# Patient Record
Sex: Male | Born: 1960
Health system: Southern US, Community
[De-identification: ages and names within clinical notes are randomized; demographics above are authoritative.]

## PROBLEM LIST (undated history)

## (undated) DIAGNOSIS — K760 Fatty (change of) liver, not elsewhere classified: Secondary | ICD-10-CM

## (undated) DIAGNOSIS — S9306XA Dislocation of unspecified ankle joint, initial encounter: Secondary | ICD-10-CM

## (undated) DIAGNOSIS — G459 Transient cerebral ischemic attack, unspecified: Secondary | ICD-10-CM

## (undated) DIAGNOSIS — M5030 Other cervical disc degeneration, unspecified cervical region: Secondary | ICD-10-CM

## (undated) DIAGNOSIS — R918 Other nonspecific abnormal finding of lung field: Secondary | ICD-10-CM

## (undated) DIAGNOSIS — G8929 Other chronic pain: Secondary | ICD-10-CM

## (undated) DIAGNOSIS — R0600 Dyspnea, unspecified: Secondary | ICD-10-CM

## (undated) DIAGNOSIS — Z87442 Personal history of urinary calculi: Secondary | ICD-10-CM

## (undated) DIAGNOSIS — F430 Acute stress reaction: Secondary | ICD-10-CM

## (undated) DIAGNOSIS — S82831A Other fracture of upper and lower end of right fibula, initial encounter for closed fracture: Secondary | ICD-10-CM

## (undated) DIAGNOSIS — I10 Essential (primary) hypertension: Secondary | ICD-10-CM

## (undated) DIAGNOSIS — R7303 Prediabetes: Secondary | ICD-10-CM

## (undated) DIAGNOSIS — M542 Cervicalgia: Secondary | ICD-10-CM

## (undated) DIAGNOSIS — I2510 Atherosclerotic heart disease of native coronary artery without angina pectoris: Secondary | ICD-10-CM

## (undated) DIAGNOSIS — F419 Anxiety disorder, unspecified: Secondary | ICD-10-CM

## (undated) DIAGNOSIS — E7800 Pure hypercholesterolemia, unspecified: Secondary | ICD-10-CM

## (undated) DIAGNOSIS — L570 Actinic keratosis: Secondary | ICD-10-CM

## (undated) DIAGNOSIS — I639 Cerebral infarction, unspecified: Secondary | ICD-10-CM

## (undated) DIAGNOSIS — G709 Myoneural disorder, unspecified: Secondary | ICD-10-CM

## (undated) DIAGNOSIS — I700 Atherosclerosis of aorta: Secondary | ICD-10-CM

## (undated) DIAGNOSIS — S92051A Displaced other extraarticular fracture of right calcaneus, initial encounter for closed fracture: Secondary | ICD-10-CM

## (undated) DIAGNOSIS — R0609 Other forms of dyspnea: Secondary | ICD-10-CM

## (undated) DIAGNOSIS — E119 Type 2 diabetes mellitus without complications: Secondary | ICD-10-CM

## (undated) DIAGNOSIS — K8020 Calculus of gallbladder without cholecystitis without obstruction: Secondary | ICD-10-CM

## (undated) DIAGNOSIS — R42 Dizziness and giddiness: Secondary | ICD-10-CM

## (undated) HISTORY — DX: Fatty (change of) liver, not elsewhere classified: K76.0

## (undated) HISTORY — DX: Dizziness and giddiness: R42

## (undated) HISTORY — DX: Acute stress reaction: F43.0

## (undated) HISTORY — DX: Prediabetes: R73.03

## (undated) HISTORY — DX: Other forms of dyspnea: R06.09

## (undated) HISTORY — DX: Other cervical disc degeneration, unspecified cervical region: M50.30

## (undated) HISTORY — DX: Other nonspecific abnormal finding of lung field: R91.8

## (undated) HISTORY — DX: Atherosclerotic heart disease of native coronary artery without angina pectoris: I25.10

## (undated) HISTORY — DX: Essential (primary) hypertension: I10

## (undated) HISTORY — DX: Pure hypercholesterolemia, unspecified: E78.00

## (undated) HISTORY — DX: Cervicalgia: M54.2

## (undated) HISTORY — DX: Anxiety disorder, unspecified: F41.9

## (undated) HISTORY — DX: Transient cerebral ischemic attack, unspecified: G45.9

## (undated) HISTORY — DX: Dislocation of unspecified ankle joint, initial encounter: S93.06XA

## (undated) HISTORY — DX: Other fracture of upper and lower end of right fibula, initial encounter for closed fracture: S82.831A

## (undated) HISTORY — DX: Atherosclerosis of aorta: I70.0

## (undated) HISTORY — DX: Actinic keratosis: L57.0

## (undated) HISTORY — DX: Dyspnea, unspecified: R06.00

---
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
## 2003-01-07 HISTORY — PX: BACK SURGERY: SHX140

## 2008-09-07 ENCOUNTER — Observation Stay (HOSPITAL_COMMUNITY): Admission: EM | Admit: 2008-09-07 | Discharge: 2008-09-08 | Payer: Self-pay | Admitting: Emergency Medicine

## 2010-04-12 LAB — COMPREHENSIVE METABOLIC PANEL
ALT: 10 U/L (ref 0–53)
Albumin: 4.1 g/dL (ref 3.5–5.2)
Alkaline Phosphatase: 67 U/L (ref 39–117)
Chloride: 104 mEq/L (ref 96–112)
Glucose, Bld: 106 mg/dL — ABNORMAL HIGH (ref 70–99)
Potassium: 3.8 mEq/L (ref 3.5–5.1)
Sodium: 137 mEq/L (ref 135–145)
Total Bilirubin: 1.4 mg/dL — ABNORMAL HIGH (ref 0.3–1.2)
Total Protein: 6.7 g/dL (ref 6.0–8.3)

## 2010-04-12 LAB — CARDIAC PANEL(CRET KIN+CKTOT+MB+TROPI)
CK, MB: 1.2 ng/mL (ref 0.3–4.0)
CK, MB: 1.4 ng/mL (ref 0.3–4.0)
Total CK: 83 U/L (ref 7–232)
Troponin I: 0.02 ng/mL (ref 0.00–0.06)

## 2010-04-12 LAB — DIFFERENTIAL
Basophils Absolute: 0 10*3/uL (ref 0.0–0.1)
Basophils Relative: 1 % (ref 0–1)
Eosinophils Absolute: 0.1 10*3/uL (ref 0.0–0.7)
Monocytes Absolute: 0.3 10*3/uL (ref 0.1–1.0)
Monocytes Relative: 6 % (ref 3–12)
Neutrophils Relative %: 53 % (ref 43–77)

## 2010-04-12 LAB — CBC
HCT: 40.7 % (ref 39.0–52.0)
Hemoglobin: 13.9 g/dL (ref 13.0–17.0)
MCHC: 34.1 g/dL (ref 30.0–36.0)
RBC: 4.48 MIL/uL (ref 4.22–5.81)
RDW: 13.8 % (ref 11.5–15.5)

## 2010-04-12 LAB — POCT I-STAT, CHEM 8
BUN: 25 mg/dL — ABNORMAL HIGH (ref 6–23)
Calcium, Ion: 1.13 mmol/L (ref 1.12–1.32)
Chloride: 105 mEq/L (ref 96–112)
Creatinine, Ser: 1 mg/dL (ref 0.4–1.5)
Glucose, Bld: 102 mg/dL — ABNORMAL HIGH (ref 70–99)
HCT: 42 % (ref 39.0–52.0)
Potassium: 3.7 mEq/L (ref 3.5–5.1)

## 2010-04-12 LAB — CK TOTAL AND CKMB (NOT AT ARMC): CK, MB: 2.3 ng/mL (ref 0.3–4.0)

## 2010-04-12 LAB — POCT CARDIAC MARKERS: CKMB, poc: <1 ng/mL — ABNORMAL LOW (ref 1.0–8.0)

## 2010-05-21 NOTE — Consult Note (Signed)
NAMESUSANA, Tom Phelps                    ACCOUNT NO.:  [id number]   MEDICAL RECORD NO.:  [id number]          PATIENT TYPE:  OBV   LOCATION:  3707                         FACILITY:  [facility]   PHYSICIAN:  Jake Bathe, MD      DATE OF BIRTH:  February 03, 1960   DATE OF CONSULTATION:  09/07/2008  DATE OF DISCHARGE:                                 CONSULTATION   PRIMARY CARE PHYSICIAN:  Emeterio Reeve, MD   REFERRING PHYSICIAN:  [facility].   REASON FOR CONSULTATION:  Evaluation of chest pain.   HISTORY OF PRESENT ILLNESS:  A 50 year old male with no prior  cardiovascular history with a father who had a myocardial infarction  died at age 83 with no diabetes, no hypertension, no hyperlipidemia who  had new onset chest pressure and heaviness that occurred 2 days ago with  work.  He works for [employer].  During heavy lifting and activity,  he felt a chest heaviness and some increased dyspnea at the time.  It  took him approximately 2 hours to have some relief over this discomfort.  He also had some associated dizziness and excessive fatigue.  He had a  concomitant headache.  This happened again yesterday morning during work  and worried him.  He did go see his primary care physician, Dr. Paulino Rily  who performed an EKG on him and was concerned and sent him to [facility]  Emergency Department for further evaluation.  Since he has been here, he  is chest pain free and quite comfortable in the bed, but is worried  about his heart given his prior family history as noted above.  Up until  2 days ago, he has had no symptoms.  He described no nausea or  diaphoresis associated with these symptoms.  He also felt that his legs  were heavy at that time and had overall malaise and fatigue.  He thinks  that he may have been slightly dehydrated also.   PAST MEDICAL HISTORY:  Possible hyperlipidemia, impaired fasting blood  sugar and strong family history of CAD.   FAMILY HISTORY:  Father died at age  45 from CAD.   SOCIAL HISTORY:  Denies any smoking, alcohol, or drug use.  He works for  [employer].   REVIEW OF SYSTEMS:  Denies any cough, fever, syncope, bleeding,  orthopnea, PND, snoring.  Unless specified above all other 12 review of  systems negative.   ALLERGIES:  No known drug allergies.   MEDICATIONS:  Advil or Aleve.   PHYSICAL EXAMINATION:  VITAL SIGNS:  Blood pressure 116/73, pulse 55,  temperature 97.0, respirations 16, sating 99% on room air.  GENERAL:  Alert and oriented x3 in no acute distress, comfortable in  bed.  EYES:  Well-perfused conjunctivae.  EOMI.  No scleral icterus.  NECK:  Supple.  No lymphadenopathy, no JVD, no carotid bruits, supple.  CARDIOVASCULAR:  Regular rate and rhythm without any murmurs, rubs, or  gallops.  LUNGS:  Clear to auscultation bilaterally.  Normal respiratory effort.  ABDOMEN:  Soft, nontender, normoactive bowel sounds.  No  hepatosplenomegaly.  No bruits.  EXTREMITIES:  No clubbing, cyanosis, or edema.  Distal pulses 2+  bilateral and equal.  SKIN:  Warm, dry, and intact.  No rashes.  NEUROLOGIC:  Nonfocal.  Moves all extremities x4.  Ambulating well.  PSYCHOLOGIC:  Normal affect.   DATA:  Cardiac biomarkers thus far are normal with troponin of 0.02,  lipase is normal at 16.  Sodium 137, potassium 3.8, BUN 23, creatinine  0.9.  Liver functions unremarkable except for slightly elevated total  bilirubin of 1.4.  White count is 5.3, hemoglobin 13.9, hematocrit 40.7,  platelets 223.  EKG personally viewed shows sinus rhythm without any  other acute abnormalities.  EKG was reviewed from Dr. Laurine Blazer' office  and there is subtle J-point elevation noted in the precordial leads,  otherwise unremarkable.  Chest x-ray, slight hyperinflation, but  otherwise normal, personally reviewed, prior medical records reviewed.   ASSESSMENT/PLAN:  A 50 year old male with strong family history of early  coronary artery disease with chest pain.   1. Chest pain - he has both typical and atypical features.  Currently,      he is quite comfortable in his ECGs as well as his cardiac      biomarkers are currently reassuring.  I agree with current      management of telemetry cardiac biomarker observation and making      him n.p.o. past midnight in case.  At this point, if his cardiac      biomarkers remain normal and his EKG remains unremarkable and he      remains chest pain free, I would prove for further risk      stratification with a noninvasive method such as nuclear stress      test.  Certainly, this may be able to be performed as an outpatient      if after observation he has deemed low risk.  Continue to monitor      him closely.  2. Family history of coronary artery disease - continue with aspirin.  3. Fatigue, malaise - certainly dehydration could had been playing a      role.  His BUN and creatinine are normal.  Electrolytes are      unremarkable.  Continue to monitor.  Reassuring symptoms currently.      I will follow along with you.  Thank you for this consult.      Jake Bathe, MD  Electronically Signed     MCS/MEDQ  D:  09/07/2008  T:  09/08/2008  Job:  [id number]   cc:   Emeterio Reeve, MD  [facility]

## 2010-05-21 NOTE — H&P (Signed)
Tom Phelps, Tom Phelps                    ACCOUNT NO.:  [id number]   MEDICAL RECORD NO.:  [id number]          PATIENT TYPE:  EMS   LOCATION:  MAJO                         FACILITY:  [facility]   PHYSICIAN:  Marinda Elk, M.D.DATE OF BIRTH:  12-15-1960   DATE OF ADMISSION:  09/07/2008  DATE OF DISCHARGE:                              HISTORY & PHYSICAL   PRIMARY CARE PHYSICIAN:  Emeterio Reeve, MD   CHIEF COMPLAINT:  Chest pain.   HISTORY OF PRESENT ILLNESS:  Tom Phelps is a 50 year old male patient with  personal history of elevated cholesterol not on medications, as well as  a strong family history of coronary artery disease in both parents.  He  reports Tuesday after work having difficulty walking secondary to  excessive fatigue and legs being heavy in his description.  He went home  as usual, showered, drank some fluids and after several hours the  symptoms slowly resolved.  Wednesday morning about 11 a.m., he noticed  that as he was working outside of his usual job with activity his  breathing just felt very heavy, especially while walking in a normal  pace.  He was not fast, not running and all day yesterday evening had a  mild headache and felt somewhat drowsy.  This morning, he awakened and  with activity began experiencing some left anterior chest pain pressure-  like that radiated into the left shoulder and into the back.  This has  persisted since he arose about 8 o'clock this morning and has been  waxing and waning in quality.  He now reports that this pain is mainly  in the left back.  This pain was associated with mild shortness of  breath.  No nausea and vomiting.  No reflux symptoms.  Because of the  chest pressure in his family history, the patient was very concerned and  he met his primary care doctor for evaluation.  A 12-lead EKG at the  office showed questionable ST elevation in the anterior leads.  Primary  care physician felt that because of the patient's symptoms,  personal  history and family history, he needed to come to the ER for evaluation  and more than likely 24 hours observation admission to rule out ischemia  as etiology of the patient's chest pain.  In addition, the patient  reports he had a chest pain episode in [place] about 10-12 years ago, had  a negative stress test then but states these symptoms are different and  seemed to be worse to him.   REVIEW OF SYSTEMS:  CONSTITUTIONAL:  No recent fevers, chills, myalgias,  weight loss or anorexia.  PSYCH:  No increase in personal stressors, no  anxiety or depression reported.  EYES, EARS, NOSE AND THROAT:  No  itching or redness of the eyes, no pain or swelling or drainage from the  ears, no facial swelling or sinus pressure and no sore throat.  NEURO:  He did have the headache yesterday which is not normal for him.  He was  not having any dizziness, no unilateral weakness disuse or  numbness or  tingling of extremities.  No visual disturbances.  He has not reported  any seizures or fall.  CHEST:  New mild dyspnea on exertion and some  mild resting shortness of breath while experiencing chest pain,  otherwise, no cough, no hemoptysis, no pleuritic chest pain.  CARDIOVASCULAR:  Chest pain is described over the past 24 hours possibly  chest pain equivalent over the past 2 days.  No tachy palpitations.  No  syncope, no peripheral edema.  No definite orthopnea.  ABDOMEN:  No  nausea and vomiting.  No diarrhea.  No noticeable abdominal pain.  No  food intolerances.  No change in bowel or bladder.  No dark or bloody  stools.  No hematemesis.  MUSCULOSKELETAL:  Noncontributory to this  exam.  GENITOURINARY:  The patient has been voiding as per usual state  and when he works outside he normally drinks plenty of fluids because of  working in the heat.   SOCIAL HISTORY:  No tobacco.  No alcohol.  Works [employer] se[employer].   FAMILY HISTORY:  Father is deceased age 50 from acute MI.  He also  had  history of hypertension and smoking.  Mother deceased at age 109 from a  postoperative MI.   PAST MEDICAL HISTORY:  1. Elevated cholesterol currently not on medication therapy.  2. Impaired fasting blood sugar.   PAST SURGICAL HISTORY:  L4-L5 back surgery in 2005.   ALLERGIES:  NKDA.   CURRENT MEDICATIONS:  Over-the-counter Advil and Aleve as needed.   PHYSICAL EXAMINATION:  GENERAL:  Pleasant male patient in no acute  distress currently complaining of a persistent dull left anterior chest  pain now mainly located in the left back.  VITAL SIGNS:  Temperature 97.5, BP 123/77, pulse 63 and regular,  respirations 20.  PSYCH:  The patient is alert and oriented x3.  Affect appropriate to  current situation.  NEURO:  Cranial nerves II-XII are grossly intact.  Moving all  extremities x4 without focal neurologic deficit.  Sclera noninjected,  nonicteric.  Conjunctivae are pink.  EAR, NOSE AND THROAT:  Ears are symmetrical.  No otorrhea.  Nose is  midline.  No rhinorrhea.  Oral mucous membranes are pink and moist.  CHEST:  Bilateral lung sounds are clear to auscultation anteriorly.  Respiratory effort is nonlabored.  He is satting 98% on 2 liters nasal  cannula O2 which has been applied in the emergency department.  CARDIOVASCULAR:  Heart sounds are S1-S2.  No rubs, murmurs, heaves or  gallops.  Pulses regular and non-tachycardic.  No appreciable JVD.  Carotid pulses are present 2+, no bruits.  Radial pulses are present and  distal pedal pulses are present as well.  No peripheral edema.  ABDOMEN:  Soft.  He has some very mild, high umbilical low epigastric  pain on palpation that does not radiate.  No guarding.  No rebounding.  Bowel sounds are present.  Otherwise, abdominal exam is negative.  EXTREMITIES:  Symmetrical in appearance without cyanosis or clubbing.   LABORATORY DATA:  Point-of-care markers have been ordered by the ER  physician and are pending at the time of  dictation.   DIAGNOSTICS:  EKG from Dr. Paulino Rily office shows sinus bradycardia,  ventricular rate 55, J-point elevation in lead I and V4-V6.  Repeat EKG  done in the ER after arrival shows normal sinus rhythm, ventricular rate  65, J-point elevation in lead I, otherwise, no acute changes.   IMPRESSION:  1. Typical chest pain.  2. Mild abdominal pain, uncertain etiology.  3. Elevated cholesterol currently not on medications.  4. History of impaired fasting blood sugar   PLAN:  1. Admit to the telemetry unit for 24-hour observation to hospitalist      team 3.  2. Cycle cardiac enzymes.  Continue O2 for chest pain and once the      pain is resolved, wean O2 off.  EKG if recurrent chest pain.  3. Daily aspirin.  Check fasting lipid panel, check hemoglobin A1c      based on personal history. Will also get cardiology consult as he      may need futher imigaing of his heart.  4. Lovenox for DVT prophylaxis.  5. Protonix for GI prophylaxis.  6. Check baseline labs with CMET and CBC.  Because the patient is      having abdominal pain, need to check an amylase and lipase to rule      out atypical presentation of pancreatitis, although no apparent      risk factors.  7. Given the patient's strong family history of sudden cardiac death,      especially, in male father age less than 60 important to evaluate      these current symptoms on the inpatient status.  If workup for      acute ischemic event is negative, the patient may need an      outpatient stress test.      Revonda Standard L. Rennis Harding, N.P.      Marinda Elk, M.D.  Electronically Signed    ALE/MEDQ  D:  09/07/2008  T:  09/08/2008  Job:  [id number]   cc:   Emeterio Reeve, MD

## 2015-05-22 ENCOUNTER — Ambulatory Visit: Payer: Managed Care, Other (non HMO) | Admitting: Neurology

## 2016-12-10 DIAGNOSIS — R42 Dizziness and giddiness: Secondary | ICD-10-CM | POA: Insufficient documentation

## 2017-01-06 DIAGNOSIS — E119 Type 2 diabetes mellitus without complications: Secondary | ICD-10-CM

## 2017-01-06 HISTORY — DX: Type 2 diabetes mellitus without complications: E11.9

## 2017-02-05 ENCOUNTER — Other Ambulatory Visit: Payer: Self-pay | Admitting: Family Medicine

## 2017-02-05 DIAGNOSIS — G459 Transient cerebral ischemic attack, unspecified: Secondary | ICD-10-CM

## 2017-02-05 DIAGNOSIS — I4891 Unspecified atrial fibrillation: Secondary | ICD-10-CM

## 2017-02-09 ENCOUNTER — Other Ambulatory Visit: Payer: Self-pay | Admitting: Family Medicine

## 2017-02-09 ENCOUNTER — Ambulatory Visit (INDEPENDENT_AMBULATORY_CARE_PROVIDER_SITE_OTHER): Payer: BLUE CROSS/BLUE SHIELD

## 2017-02-09 DIAGNOSIS — I499 Cardiac arrhythmia, unspecified: Secondary | ICD-10-CM

## 2017-02-09 DIAGNOSIS — G459 Transient cerebral ischemic attack, unspecified: Secondary | ICD-10-CM

## 2017-02-09 DIAGNOSIS — R42 Dizziness and giddiness: Secondary | ICD-10-CM | POA: Diagnosis not present

## 2017-02-09 DIAGNOSIS — I4891 Unspecified atrial fibrillation: Secondary | ICD-10-CM

## 2017-02-09 DIAGNOSIS — R079 Chest pain, unspecified: Secondary | ICD-10-CM

## 2017-04-09 ENCOUNTER — Emergency Department (HOSPITAL_COMMUNITY)
Admission: EM | Admit: 2017-04-09 | Discharge: 2017-04-10 | Disposition: A | Discharge status: Home / Self Care | Payer: BLUE CROSS/BLUE SHIELD | Attending: Emergency Medicine | Admitting: Emergency Medicine

## 2017-04-09 DIAGNOSIS — W19XXXA Unspecified fall, initial encounter: Secondary | ICD-10-CM

## 2017-04-09 DIAGNOSIS — W11XXXA Fall on and from ladder, initial encounter: Secondary | ICD-10-CM | POA: Insufficient documentation

## 2017-04-09 DIAGNOSIS — S80812A Abrasion, left lower leg, initial encounter: Secondary | ICD-10-CM | POA: Diagnosis not present

## 2017-04-09 DIAGNOSIS — Y939 Activity, unspecified: Secondary | ICD-10-CM | POA: Insufficient documentation

## 2017-04-09 DIAGNOSIS — Y998 Other external cause status: Secondary | ICD-10-CM | POA: Insufficient documentation

## 2017-04-09 DIAGNOSIS — S82831A Other fracture of upper and lower end of right fibula, initial encounter for closed fracture: Secondary | ICD-10-CM | POA: Diagnosis not present

## 2017-04-09 DIAGNOSIS — R109 Unspecified abdominal pain: Secondary | ICD-10-CM | POA: Insufficient documentation

## 2017-04-09 DIAGNOSIS — S92001A Unspecified fracture of right calcaneus, initial encounter for closed fracture: Secondary | ICD-10-CM | POA: Diagnosis not present

## 2017-04-09 DIAGNOSIS — M549 Dorsalgia, unspecified: Secondary | ICD-10-CM | POA: Insufficient documentation

## 2017-04-09 DIAGNOSIS — Y929 Unspecified place or not applicable: Secondary | ICD-10-CM | POA: Insufficient documentation

## 2017-04-09 DIAGNOSIS — Z7902 Long term (current) use of antithrombotics/antiplatelets: Secondary | ICD-10-CM | POA: Diagnosis not present

## 2017-04-09 DIAGNOSIS — S80811A Abrasion, right lower leg, initial encounter: Secondary | ICD-10-CM | POA: Diagnosis not present

## 2017-04-09 DIAGNOSIS — S99911A Unspecified injury of right ankle, initial encounter: Secondary | ICD-10-CM | POA: Diagnosis present

## 2017-04-09 DIAGNOSIS — Z23 Encounter for immunization: Secondary | ICD-10-CM | POA: Insufficient documentation

## 2017-04-09 DIAGNOSIS — S90415A Abrasion, left lesser toe(s), initial encounter: Secondary | ICD-10-CM | POA: Diagnosis not present

## 2017-04-09 MED ORDER — TETANUS-DIPHTH-ACELL PERTUSSIS 5-2.5-18.5 LF-MCG/0.5 IM SUSP
0.5000 mL | Freq: Once | INTRAMUSCULAR | Status: AC
  Administered 2017-04-10: 0.5 mL via INTRAMUSCULAR
  Filled 2017-04-09: qty 0.5

## 2017-04-09 NOTE — ED Triage Notes (Signed)
Patient fell/slid down extended 10 foot ladder, patient landed on his feet. Deformity/swelling noted to right, ankle. Alert and oriented x4, denies LOC

## 2017-04-10 ENCOUNTER — Other Ambulatory Visit: Payer: Self-pay

## 2017-04-10 ENCOUNTER — Emergency Department (HOSPITAL_COMMUNITY): Payer: BLUE CROSS/BLUE SHIELD

## 2017-04-10 LAB — CBC WITH DIFFERENTIAL/PLATELET
Basophils Absolute: 0 10*3/uL (ref 0.0–0.1)
Basophils Relative: 0 %
EOS ABS: 0.1 10*3/uL (ref 0.0–0.7)
EOS PCT: 1 %
HCT: 38.3 % — ABNORMAL LOW (ref 39.0–52.0)
Hemoglobin: 12.8 g/dL — ABNORMAL LOW (ref 13.0–17.0)
LYMPHS ABS: 3 10*3/uL (ref 0.7–4.0)
Lymphocytes Relative: 24 %
MCH: 29.8 pg (ref 26.0–34.0)
MCHC: 33.4 g/dL (ref 30.0–36.0)
MCV: 89.3 fL (ref 78.0–100.0)
MONO ABS: 0.8 10*3/uL (ref 0.1–1.0)
MONOS PCT: 7 %
Neutro Abs: 8.4 10*3/uL — ABNORMAL HIGH (ref 1.7–7.7)
Neutrophils Relative %: 68 %
Platelets: 235 10*3/uL (ref 150–400)
RBC: 4.29 MIL/uL (ref 4.22–5.81)
RDW: 13.6 % (ref 11.5–15.5)
WBC: 12.3 10*3/uL — ABNORMAL HIGH (ref 4.0–10.5)

## 2017-04-10 LAB — BASIC METABOLIC PANEL
Anion gap: 11 (ref 5–15)
BUN: 14 mg/dL (ref 6–20)
CO2: 21 mmol/L — ABNORMAL LOW (ref 22–32)
CREATININE: 1.26 mg/dL — AB (ref 0.61–1.24)
Calcium: 8.9 mg/dL (ref 8.9–10.3)
Chloride: 105 mmol/L (ref 101–111)
GFR calc Af Amer: >60 mL/min (ref >60)
GFR calc non Af Amer: >60 mL/min (ref >60)
Glucose, Bld: 162 mg/dL — ABNORMAL HIGH (ref 65–99)
Potassium: 2.9 mmol/L — ABNORMAL LOW (ref 3.5–5.1)
SODIUM: 137 mmol/L (ref 135–145)

## 2017-04-10 LAB — PROTIME-INR
INR: 1.06
Prothrombin Time: 13.7 seconds (ref 11.4–15.2)

## 2017-04-10 MED ORDER — HYDROMORPHONE HCL 1 MG/ML IJ SOLN: 1.0000 mg | Freq: Once | INTRAMUSCULAR | Status: DC

## 2017-04-10 MED ORDER — IOPAMIDOL (ISOVUE-300) INJECTION 61%
100.0000 mL | Freq: Once | INTRAVENOUS | Status: AC | PRN
  Administered 2017-04-10: 100 mL via INTRAVENOUS

## 2017-04-10 MED ORDER — HYDROMORPHONE HCL 1 MG/ML IJ SOLN
1.0000 mg | INTRAMUSCULAR | Status: AC
  Administered 2017-04-10: 1 mg via INTRAVENOUS
  Filled 2017-04-10: qty 1

## 2017-04-10 MED ORDER — OXYCODONE-ACETAMINOPHEN 5-325 MG PO TABS: 1.0000 | ORAL_TABLET | ORAL | 0 refills | Status: DC | PRN

## 2017-04-10 MED ORDER — ONDANSETRON 4 MG PO TBDP: 4.0000 mg | ORAL_TABLET | Freq: Three times a day (TID) | ORAL | 0 refills | Status: DC | PRN

## 2017-04-10 MED ORDER — SODIUM CHLORIDE 0.9 % IV SOLN
8.0000 mg | INTRAVENOUS | Status: AC
  Administered 2017-04-10: 8 mg via INTRAVENOUS
  Filled 2017-04-10: qty 4

## 2017-04-10 MED ORDER — IOPAMIDOL (ISOVUE-300) INJECTION 61%
INTRAVENOUS | Status: AC
  Filled 2017-04-10: qty 100

## 2017-04-10 MED ORDER — HYDROMORPHONE HCL 1 MG/ML IJ SOLN
1.0000 mg | Freq: Once | INTRAMUSCULAR | Status: AC
  Administered 2017-04-10: 1 mg via INTRAVENOUS
  Filled 2017-04-10: qty 1

## 2017-04-10 MED ORDER — PROCHLORPERAZINE EDISYLATE 5 MG/ML IJ SOLN
10.0000 mg | Freq: Once | INTRAMUSCULAR | Status: AC
  Administered 2017-04-10: 10 mg via INTRAVENOUS
  Filled 2017-04-10: qty 2

## 2017-04-10 MED ORDER — ONDANSETRON HCL 4 MG/2ML IJ SOLN
INTRAMUSCULAR | Status: AC
  Filled 2017-04-10: qty 4

## 2017-04-10 NOTE — ED Notes (Signed)
Patient vomiting.

## 2017-04-10 NOTE — ED Notes (Signed)
Received a call from xray requesting pain meds and nausea meds for patient so that they could complete films (patient was crying out and vomiting in xray)

## 2017-04-10 NOTE — ED Provider Notes (Signed)
[facility] EMERGENCY DEPARTMENT Provider Note   CSN: [id number] Arrival date & time: 04/09/17  2351     History   Chief Complaint Chief Complaint  Patient presents with  . Fall    R foot injury    HPI Tom Phelps is a 57 y.o. male.  Patient was painting at home at the top of a 10 foot ladder.  The ladder slid down the wall and patient landed on his bilateral feet and fell to his side.  He was not wearing shoes.  Denies hitting head or losing consciousness.  Complains of pain and swelling to his left toes and right ankle and foot.  Denies any head, neck, back, chest or abdominal pain.  Patient does take Plavix.  No other blood thinners. Able to wiggle toes.  Denies any numbness or tingling.  Unknown last tetanus shot.  The history is provided by the patient and the EMS personnel.  Fall  Pertinent negatives include no chest pain, no abdominal pain, no headaches and no shortness of breath.    No past medical history on file.  There are no active problems to display for this patient.   ** The histories are not reviewed yet. Please review them in the "History" navigator section and refresh this SmartLink.      Home Medications    Prior to Admission medications   Not on File    Family History No family history on file.  Social History Social History   Tobacco Use  . Smoking status: Not on file  Substance Use Topics  . Alcohol use: Not on file  . Drug use: Not on file     Allergies   Patient has no known allergies.   Review of Systems Review of Systems  Constitutional: Negative for activity change, appetite change and fever.  HENT: Negative for congestion and nosebleeds.   Respiratory: Negative for chest tightness and shortness of breath.   Cardiovascular: Negative for chest pain.  Gastrointestinal: Negative for abdominal pain, nausea and vomiting.  Genitourinary: Negative for dysuria.  Musculoskeletal: Positive for arthralgias and  myalgias. Negative for back pain and neck pain.  Neurological: Negative for dizziness, weakness, light-headedness, numbness and headaches.    all other systems are negative except as noted in the HPI and PMH.    Physical Exam Updated Vital Signs BP 127/71 (BP Location: Right Arm)   Pulse 73   Temp 98 F (36.7 C) (Oral)   Resp 19   Ht 5\' 10"  (1.778 m)   Wt 88.5 kg (195 lb)   SpO2 97%   BMI 27.98 kg/m   Physical Exam  Constitutional: He is oriented to person, place, and time. He appears well-developed and well-nourished. No distress.  Somewhat sedated after receiving 250 mcg of fentanyl by EMS  HENT:  Head: Normocephalic and atraumatic.  Mouth/Throat: Oropharynx is clear and moist. No oropharyngeal exudate.  Eyes: Pupils are equal, round, and reactive to light. Conjunctivae and EOM are normal.  Neck: Normal range of motion. Neck supple.  No meningismus.  Cardiovascular: Normal rate, regular rhythm, normal heart sounds and intact distal pulses.  No murmur heard. Pulmonary/Chest: Effort normal and breath sounds normal. No respiratory distress.  Abdominal: Soft. There is no tenderness. There is no rebound and no guarding.  Musculoskeletal: He exhibits edema, tenderness and deformity.  Right ankle and dorsal foot are diffusely swollen, tenderness across lateral malleolus.  Intact DP and PT pulses.  Achilles tendon is intact.  There are abrasions  to bilateral shins bilaterally and abrasions to left toes.  Intact DP and PT pulses in left foot as well.  Compartments are soft.  No proximal fibular tenderness.  Neurological: He is alert and oriented to person, place, and time. No cranial nerve deficit. He exhibits normal muscle tone. Coordination normal.  No ataxia on finger to nose bilaterally. No pronator drift. 5/5 strength throughout. CN 2-12 intact.Equal grip strength. Sensation intact.   Skin: Skin is warm.  Psychiatric: He has a normal mood and affect. His behavior is normal.    Nursing note and vitals reviewed.    ED Treatments / Results  Labs (all labs ordered are listed, but only abnormal results are displayed) Labs Reviewed  CBC WITH DIFFERENTIAL/PLATELET - Abnormal; Notable for the following components:      Result Value   WBC 12.3 (*)    Hemoglobin 12.8 (*)    HCT 38.3 (*)    Neutro Abs 8.4 (*)    All other components within normal limits  BASIC METABOLIC PANEL - Abnormal; Notable for the following components:   Potassium 2.9 (*)    CO2 21 (*)    Glucose, Bld 162 (*)    Creatinine, Ser 1.26 (*)    All other components within normal limits  PROTIME-INR    EKG None  Radiology Dg Tibia/fibula Left  Result Date: 04/10/2017 CLINICAL DATA:  Fall with deformity EXAM: LEFT TIBIA AND FIBULA - 2 VIEW COMPARISON:  None. FINDINGS: No acute displaced fracture or malalignment is seen. Moderate degenerative changes medial compartment of the knee. IMPRESSION: No definite acute osseous abnormality Electronically Signed   By: Jasmine Pang M.D.   On: 04/10/2017 01:47   Dg Tibia/fibula Right  Result Date: 04/10/2017 CLINICAL DATA:  Fall from 10 foot ladder EXAM: RIGHT TIBIA AND FIBULA - 2 VIEW COMPARISON:  None. FINDINGS: Mild degenerative changes involving the medial compartment of the knee. Proximal tibia and fibula are intact. Acute fracture involving the distal fibula with 15 mm superior laterally displaced cortical bone fragment. Lateral soft tissue swelling. Partially visible comminuted calcaneal fracture IMPRESSION: 1. Acute displaced fracture fragment adjacent to the lateral fibular malleolus 2. Partially visible calcaneal fracture Electronically Signed   By: Jasmine Pang M.D.   On: 04/10/2017 01:46   Dg Ankle Complete Left  Result Date: 04/10/2017 CLINICAL DATA:  Fall from 10 foot ladder EXAM: LEFT ANKLE COMPLETE - 3+ VIEW COMPARISON:  None. FINDINGS: No acute displaced fracture or malalignment. Ankle mortise is symmetric. Small calcaneal bone spur.  IMPRESSION: No acute osseous abnormality Electronically Signed   By: Jasmine Pang M.D.   On: 04/10/2017 01:48   Dg Ankle Complete Right  Result Date: 04/10/2017 CLINICAL DATA:  Fall from 10 foot ladder EXAM: RIGHT ANKLE - COMPLETE 3+ VIEW COMPARISON:  None. FINDINGS: Acute fracture involving the lateral cortex of the distal fibular malleolus with 16 mm displaced bone fragment superiorly and posteriorly. Acute comminuted and impacted calcaneal fracture. Ankle mortise grossly symmetric IMPRESSION: 1. Acute displaced fracture fragment off the lateral fibular malleolus. 2. Partially visible comminuted calcaneal fracture. Electronically Signed   By: Jasmine Pang M.D.   On: 04/10/2017 01:54   Ct Head Wo Contrast  Result Date: 04/10/2017 CLINICAL DATA:  Fall from ladder EXAM: CT HEAD WITHOUT CONTRAST CT CERVICAL SPINE WITHOUT CONTRAST TECHNIQUE: Multidetector CT imaging of the head and cervical spine was performed following the standard protocol without intravenous contrast. Multiplanar CT image reconstructions of the cervical spine were also generated. COMPARISON:  None.  FINDINGS: CT HEAD FINDINGS Brain: No mass lesion, intraparenchymal hemorrhage or extra-axial collection. No evidence of acute cortical infarct. Brain parenchyma and CSF-containing spaces are normal for age. Vascular: No hyperdense vessel or unexpected calcification. Skull: Normal visualized skull base, calvarium and extracranial soft tissues. Sinuses/Orbits: No sinus fluid levels or advanced mucosal thickening. No mastoid effusion. Normal orbits. CT CERVICAL SPINE FINDINGS Alignment: No static subluxation. Facets are aligned. Occipital condyles are normally positioned. Skull base and vertebrae: No acute fracture. Soft tissues and spinal canal: No prevertebral fluid or swelling. No visible canal hematoma. Disc levels: Disc space narrowing at C5-C6 with uncovertebral hypertrophy causing mild-to-moderate left foraminal stenosis. Upper chest: No  pneumothorax, pulmonary nodule or pleural effusion. Other: Normal visualized paraspinal cervical soft tissues. IMPRESSION: No acute abnormality of the head or cervical spine. Electronically Signed   By: Deatra Robinson M.D.   On: 04/10/2017 03:26   Ct Chest W Contrast  Result Date: 04/10/2017 CLINICAL DATA:  Patient fell down a ladder. EXAM: CT CHEST, ABDOMEN, AND PELVIS WITH CONTRAST TECHNIQUE: Multidetector CT imaging of the chest, abdomen and pelvis was performed following the standard protocol during bolus administration of intravenous contrast. CONTRAST:  ISOVUE-300 IOPAMIDOL (ISOVUE-300) INJECTION 61% COMPARISON:  None. FINDINGS: CT CHEST FINDINGS Cardiovascular: No significant vascular findings. Normal heart size. No pericardial effusion. Coronary artery calcifications. Mediastinum/Nodes: No enlarged mediastinal, hilar, or axillary lymph nodes. Thyroid gland, trachea, and esophagus demonstrate no significant findings. Lungs/Pleura: Mild atelectasis in the lung bases. Multiple pulmonary nodules demonstrated in both lungs, largest measuring about 5 mm in diameter. Some of these nodules are calcified and these likely represent granulomas. No consolidation or airspace disease. No pleural effusions. No pneumothorax. Airways are patent. Musculoskeletal: No acute displaced fractures identified. CT ABDOMEN PELVIS FINDINGS Hepatobiliary: Mild diffuse fatty infiltration of the liver. Gallbladder and bile ducts are unremarkable. Pancreas: Unremarkable. No pancreatic ductal dilatation or surrounding inflammatory changes. Spleen: No splenic injury or perisplenic hematoma. Adrenals/Urinary Tract: No adrenal hemorrhage or renal injury identified. Bladder is unremarkable. Stomach/Bowel: Stomach is within normal limits. Appendix appears normal. No evidence of bowel wall thickening, distention, or inflammatory changes. Vascular/Lymphatic: Aortic atherosclerosis. No enlarged abdominal or pelvic lymph nodes. Reproductive:  Prostate is unremarkable. Other: No abdominal wall hernia or abnormality. No abdominopelvic ascites. Musculoskeletal: Postoperative fixation of the lumbosacral interspace. No acute displaced fractures identified. IMPRESSION: 1. No acute posttraumatic changes demonstrated in the chest, abdomen, or pelvis. 2. Mild diffuse fatty infiltration of the liver. 3. Coronary artery and aortic atherosclerosis. 4. Multiple bilateral pulmonary nodules measuring up to 5 mm diameter. Some are calcified. Probable granulomas. No follow-up needed if patient is low-risk (and has no known or suspected primary neoplasm). Non-contrast chest CT can be considered in 12 months if patient is high-risk. This recommendation follows the consensus statement: Guidelines for Management of Incidental Pulmonary Nodules Detected on CT Images: From the Fleischner Society 2017; Radiology 2017; 284:228-243. Electronically Signed   By: Burman Nieves M.D.   On: 04/10/2017 04:42   Ct Cervical Spine Wo Contrast  Result Date: 04/10/2017 CLINICAL DATA:  Fall from ladder EXAM: CT HEAD WITHOUT CONTRAST CT CERVICAL SPINE WITHOUT CONTRAST TECHNIQUE: Multidetector CT imaging of the head and cervical spine was performed following the standard protocol without intravenous contrast. Multiplanar CT image reconstructions of the cervical spine were also generated. COMPARISON:  None. FINDINGS: CT HEAD FINDINGS Brain: No mass lesion, intraparenchymal hemorrhage or extra-axial collection. No evidence of acute cortical infarct. Brain parenchyma and CSF-containing spaces are normal for age. Vascular:  No hyperdense vessel or unexpected calcification. Skull: Normal visualized skull base, calvarium and extracranial soft tissues. Sinuses/Orbits: No sinus fluid levels or advanced mucosal thickening. No mastoid effusion. Normal orbits. CT CERVICAL SPINE FINDINGS Alignment: No static subluxation. Facets are aligned. Occipital condyles are normally positioned. Skull base and  vertebrae: No acute fracture. Soft tissues and spinal canal: No prevertebral fluid or swelling. No visible canal hematoma. Disc levels: Disc space narrowing at C5-C6 with uncovertebral hypertrophy causing mild-to-moderate left foraminal stenosis. Upper chest: No pneumothorax, pulmonary nodule or pleural effusion. Other: Normal visualized paraspinal cervical soft tissues. IMPRESSION: No acute abnormality of the head or cervical spine. Electronically Signed   By: Deatra Robinson M.D.   On: 04/10/2017 03:26   Ct Lumbar Spine Wo Contrast  Result Date: 04/10/2017 CLINICAL DATA:  Fall from ladder with landing on feet. EXAM: CT LUMBAR SPINE WITHOUT CONTRAST TECHNIQUE: Multidetector CT imaging of the lumbar spine was performed without intravenous contrast administration. Multiplanar CT image reconstructions were also generated. COMPARISON:  None. FINDINGS: Segmentation: 5 lumbar type vertebrae. Alignment: Normal. Vertebrae: L5-S1 posterior fusion.  No acute fracture. Paraspinal and other soft tissues: Calcific aortic atherosclerosis. Disc levels: Disc spaces are preserved. No spinal canal stenosis. Mild bilateral L4-L5 foraminal narrowing. IMPRESSION: No acute abnormality of the lumbar spine. Electronically Signed   By: Deatra Robinson M.D.   On: 04/10/2017 03:18   Ct Abdomen Pelvis W Contrast  Result Date: 04/10/2017 CLINICAL DATA:  Patient fell down a ladder. EXAM: CT CHEST, ABDOMEN, AND PELVIS WITH CONTRAST TECHNIQUE: Multidetector CT imaging of the chest, abdomen and pelvis was performed following the standard protocol during bolus administration of intravenous contrast. CONTRAST:  ISOVUE-300 IOPAMIDOL (ISOVUE-300) INJECTION 61% COMPARISON:  None. FINDINGS: CT CHEST FINDINGS Cardiovascular: No significant vascular findings. Normal heart size. No pericardial effusion. Coronary artery calcifications. Mediastinum/Nodes: No enlarged mediastinal, hilar, or axillary lymph nodes. Thyroid gland, trachea, and esophagus  demonstrate no significant findings. Lungs/Pleura: Mild atelectasis in the lung bases. Multiple pulmonary nodules demonstrated in both lungs, largest measuring about 5 mm in diameter. Some of these nodules are calcified and these likely represent granulomas. No consolidation or airspace disease. No pleural effusions. No pneumothorax. Airways are patent. Musculoskeletal: No acute displaced fractures identified. CT ABDOMEN PELVIS FINDINGS Hepatobiliary: Mild diffuse fatty infiltration of the liver. Gallbladder and bile ducts are unremarkable. Pancreas: Unremarkable. No pancreatic ductal dilatation or surrounding inflammatory changes. Spleen: No splenic injury or perisplenic hematoma. Adrenals/Urinary Tract: No adrenal hemorrhage or renal injury identified. Bladder is unremarkable. Stomach/Bowel: Stomach is within normal limits. Appendix appears normal. No evidence of bowel wall thickening, distention, or inflammatory changes. Vascular/Lymphatic: Aortic atherosclerosis. No enlarged abdominal or pelvic lymph nodes. Reproductive: Prostate is unremarkable. Other: No abdominal wall hernia or abnormality. No abdominopelvic ascites. Musculoskeletal: Postoperative fixation of the lumbosacral interspace. No acute displaced fractures identified. IMPRESSION: 1. No acute posttraumatic changes demonstrated in the chest, abdomen, or pelvis. 2. Mild diffuse fatty infiltration of the liver. 3. Coronary artery and aortic atherosclerosis. 4. Multiple bilateral pulmonary nodules measuring up to 5 mm diameter. Some are calcified. Probable granulomas. No follow-up needed if patient is low-risk (and has no known or suspected primary neoplasm). Non-contrast chest CT can be considered in 12 months if patient is high-risk. This recommendation follows the consensus statement: Guidelines for Management of Incidental Pulmonary Nodules Detected on CT Images: From the Fleischner Society 2017; Radiology 2017; 284:228-243. Electronically Signed    By: Burman Nieves M.D.   On: 04/10/2017 04:42   Ct Foot  Right Wo Contrast  Result Date: 04/10/2017 CLINICAL DATA:  Patient fell down a ladder. Right ankle fractures on plain radiographs. EXAM: CT OF THE RIGHT FOOT WITHOUT CONTRAST TECHNIQUE: Multidetector CT imaging of the right foot was performed according to the standard protocol. Multiplanar CT image reconstructions were also generated. COMPARISON:  Right ankle radiographs 04/10/2017 FINDINGS: Bones/Joint/Cartilage There is a mildly displaced avulsion fracture off of the distal fibula. Markedly comminuted fractures demonstrated throughout the calcaneus with depression impaction of fracture fragments. Fracture lines extend to the anterior, middle, and posterior facets. 2 mm cortical gap at the calcaneal cuboidal joint. Fracture lines across the base of the sustentaculum. The talus, navicular, cuboidal, and cuneiform bones appear intact. Ligaments Suboptimally assessed by CT. Muscles and Tendons Anterior, posterior, and peroneal tendons do not appear significantly thickened. The peroneal tendons appear to be displaced somewhat posteriorly by the lateral fibular fracture fragment. Soft tissues Diffuse soft tissue stranding about the ankle may indicate edema or contusion. No loculated collections. IMPRESSION: 1. Avulsion fracture off the distal fibula, causing displacement of the peroneal tendons. 2. Comminuted and depressed fractures demonstrated throughout the calcaneus with extension of fracture lines to the anterior, middle, and posterior facets as well as the calcaneal cuboidal joint. 3. Diffuse soft tissue edema. Electronically Signed   By: Burman NievesWilliam  Stevens M.D.   On: 04/10/2017 05:01   Dg Foot Complete Left  Result Date: 04/10/2017 CLINICAL DATA:  Fall with deformity EXAM: LEFT FOOT - COMPLETE 3+ VIEW COMPARISON:  None. FINDINGS: No acute displaced fracture or malalignment. Mild degenerative changes at the first MTP joint. Small plantar calcaneal spur  IMPRESSION: No acute osseous abnormality Electronically Signed   By: Jasmine PangKim  Fujinaga M.D.   On: 04/10/2017 01:50   Dg Foot Complete Right  Result Date: 04/10/2017 CLINICAL DATA:  Fall from 10 foot ladder EXAM: RIGHT FOOT COMPLETE - 3+ VIEW COMPARISON:  None. FINDINGS: No acute displaced fracture or malalignment in the digits of the foot. Mild degenerative changes at the first MTP joint. Acute comminuted fracture involving the mid to anterior calcaneus IMPRESSION: Acute comminuted and slightly impacted calcaneal fracture Electronically Signed   By: Jasmine PangKim  Fujinaga M.D.   On: 04/10/2017 01:44    Procedures Procedures (including critical care time)  Medications Ordered in ED Medications  Tdap (BOOSTRIX) injection 0.5 mL (has no administration in time range)     Initial Impression / Assessment and Plan / ED Course  I have reviewed the triage vital signs and the nursing notes.  Pertinent labs & imaging results that were available during my care of the patient were reviewed by me and considered in my medical decision making (see chart for details).    Fall from ladder with right foot and ankle injury.  Did not hit head or lose consciousness.  No neck or back pain.  Ankle is diffusely swollen, tender with mild deformity.  Pulses are intact. Abrasions present but no evidence of open fracture.  Tetanus updated.  X-rays show displaced distal fibula fracture with comminuted calcaneus fracture.  Compartments remain soft and pulses are intact.  Ankle mortise appears to be intact on x-ray.  X-rays discussed with Dr. Jena GaussHaddix of orthopedics.  He will review images.  He recommends CT foot before discharge as well as splint and crutches.  Remainder of traumatic imaging negative. Patient did complain of some back pain that radiated to abdomen so additional imaging was obtained.   Patient splinted by tech. NVI after splint placement. Maintain NWB, followup with ortho, return  precautions discussed.  SPLINT  APPLICATION Date/Time: 9:50 AM Authorized by: Glynn Octave Consent: Verbal consent obtained. Risks and benefits: risks, benefits and alternatives were discussed Consent given by: patient Splint applied by: orthopedic technician Location details: R ankle and foot Splint type: stirrup and posterior Supplies used: fiberglass, ace bandage Post-procedure: The splinted body part was neurovascularly unchanged following the procedure. Patient tolerance: Patient tolerated the procedure well with no immediate complications.    BP 130/84   Pulse 84   Temp 98 F (36.7 C) (Oral)   Resp (!) 9   Ht 5\' 10"  (1.778 m)   Wt 88.5 kg (195 lb)   SpO2 99%   BMI 27.98 kg/m    Final Clinical Impressions(s) / ED Diagnoses   Final diagnoses:  Fall, initial encounter  Other closed fracture of distal end of right fibula, initial encounter  Closed displaced fracture of right calcaneus, unspecified portion of calcaneus, initial encounter    ED Discharge Orders    None       Shalaina Guardiola, Jeannett Senior, MD 04/10/17 [phone]

## 2017-04-10 NOTE — ED Notes (Signed)
Ortho Tech paged.

## 2017-04-10 NOTE — Discharge Instructions (Signed)
Do not place weight on your right leg.  Use the crutches as prescribed.  Keep leg elevated and take the pain medication as prescribed.  Follow-up with Dr. Jena GaussHaddix regarding your ankle and foot fracture.  Return to the ED if you have worsening pain, numbness, tingling, skin color or temperature change or any other concerns.

## 2017-04-14 ENCOUNTER — Ambulatory Visit (INDEPENDENT_AMBULATORY_CARE_PROVIDER_SITE_OTHER): Payer: BLUE CROSS/BLUE SHIELD | Admitting: Orthopaedic Surgery

## 2017-04-14 VITALS — Ht 70.0 in | Wt 195.0 lb

## 2017-04-14 DIAGNOSIS — S92051A Displaced other extraarticular fracture of right calcaneus, initial encounter for closed fracture: Secondary | ICD-10-CM | POA: Diagnosis not present

## 2017-04-14 NOTE — Progress Notes (Signed)
   Office Visit Note   Patient: Tom Phelps           Date of Birth: 1960-01-17           MRN: [id number] Visit Date: 04/14/2017              Requested by: Mila PalmerWolters, Sharon, MD [address] [place], [place] [address] PCP: Mila PalmerWolters, Sharon, MD   Assessment & Plan: Visit Diagnoses:  1. Displaced other extraarticular fracture of right calcaneus, initial encounter for closed fracture     Plan: Impression 57 year old gentleman with comminuted right calcaneus fracture.  I reviewed the CT scan overall the subtalar joint is intact.  His Bohler's angle is decreased however patient is on disability at baseline.  I think he should do fine with nonoperative treatment.  We will keep him strictly nonweightbearing for at least 6 weeks.  Fracture boot was provided today.  Questions encouraged and answered.  Follow-up in 2 weeks with 2 view x-rays of the right calcaneus  Follow-Up Instructions: Return in about 2 weeks (around 04/28/2017).   Orders:  No orders of the defined types were placed in this encounter.  No orders of the defined types were placed in this encounter.     Procedures: No procedures performed   Clinical Data: No additional findings.   Subjective: Chief Complaint  Patient presents with  . Right Ankle - Follow-up    F/u distal fibula fx 04/09/17  . Right Foot - Follow-up    F/u calcaneal fx 04/09/17  - fall from ladder    Patient is a 57 year old gentleman on disability for bilateral carpal tunnel syndrome and chronic back pain who comes in with an acute right calcaneus fracture that he sustained from falling from a ladder while painting.  He endorses moderate pain.  His pain is well controlled with over-the-counter NSAIDs.  He denies any numbness and tingling.   Review of Systems  Constitutional: Negative.   All other systems reviewed and are negative.    Objective: Vital Signs: Ht 5\' 10"  (1.778 m)   Wt 195 lb (88.5 kg)   BMI 27.98 kg/m   Physical  Exam  Constitutional: He is oriented to person, place, and time. He appears well-developed and well-nourished.  HENT:  Head: Normocephalic and atraumatic.  Eyes: Pupils are equal, round, and reactive to light.  Neck: Neck supple.  Pulmonary/Chest: Effort normal.  Abdominal: Soft.  Musculoskeletal: Normal range of motion.  Neurological: He is alert and oriented to person, place, and time.  Skin: Skin is warm.  Psychiatric: He has a normal mood and affect. His behavior is normal. Judgment and thought content normal.  Nursing note and vitals reviewed.   Ortho Exam Right foot and ankle exam shows moderate swelling.  Compartments are soft.  Toes are warm and well-perfused.  Neurovascular intact.  Achilles tendon intact. Specialty Comments:  No specialty comments available.  Imaging: No results found.   PMFS History: There are no active problems to display for this patient.  No past medical history on file.  No family history on file.   Social History   Occupational History  . Not on file  Tobacco Use  . Smoking status: Not on file  Substance and Sexual Activity  . Alcohol use: Not on file  . Drug use: Not on file  . Sexual activity: Not on file

## 2017-04-15 ENCOUNTER — Telehealth (INDEPENDENT_AMBULATORY_CARE_PROVIDER_SITE_OTHER): Payer: Self-pay

## 2017-04-15 ENCOUNTER — Telehealth (INDEPENDENT_AMBULATORY_CARE_PROVIDER_SITE_OTHER): Payer: Self-pay | Admitting: Orthopaedic Surgery

## 2017-04-15 NOTE — Telephone Encounter (Signed)
It's ok to take off CAM walker as long as he's elevating his foot.  Norco 5/325 1-2 tab bid prn. #30.

## 2017-04-15 NOTE — Telephone Encounter (Signed)
Patient would like to know if he can take CAM Boot off when he goes to sleep at night? Stated that he took CAM boot off last night due to having pain, but put it back on this morning.  Would like a Rx for pain?  Stated that he would like a different Rx other than Oxycodone.  CB# is [phone].  Please advise.  Thank you.

## 2017-04-15 NOTE — Telephone Encounter (Signed)
Please advise on ALL parts of message.  

## 2017-04-16 ENCOUNTER — Other Ambulatory Visit (INDEPENDENT_AMBULATORY_CARE_PROVIDER_SITE_OTHER): Payer: Self-pay

## 2017-04-16 ENCOUNTER — Telehealth (INDEPENDENT_AMBULATORY_CARE_PROVIDER_SITE_OTHER): Payer: Self-pay | Admitting: Orthopaedic Surgery

## 2017-04-16 MED ORDER — HYDROCODONE-ACETAMINOPHEN 5-325 MG PO TABS: 1.0000 | ORAL_TABLET | Freq: Four times a day (QID) | ORAL | 0 refills | Status: DC | PRN

## 2017-04-16 NOTE — Telephone Encounter (Signed)
Patient called asking for a refill on his oxycodone. CB # [phone]

## 2017-04-16 NOTE — Telephone Encounter (Signed)
#  30.  1-2 tab daily prn

## 2017-04-16 NOTE — Telephone Encounter (Signed)
Can you sign for Dr Roda ShuttersXu

## 2017-04-16 NOTE — Telephone Encounter (Signed)
Yes can sign if you bring it to me

## 2017-04-16 NOTE — Telephone Encounter (Signed)
Called patient to advise Rx ready for pick up at the front desk. Advised him about elevating foot.

## 2017-04-16 NOTE — Telephone Encounter (Signed)
Please advise 

## 2017-04-23 NOTE — Telephone Encounter (Signed)
Patient called wanting a refill on oxycodone.  Please call patient to advise.

## 2017-04-23 NOTE — Telephone Encounter (Signed)
Can you sign Rx please. Dr Roda ShuttersXu approved.

## 2017-04-23 NOTE — Telephone Encounter (Signed)
Bring it to me

## 2017-04-27 ENCOUNTER — Other Ambulatory Visit (INDEPENDENT_AMBULATORY_CARE_PROVIDER_SITE_OTHER): Payer: Self-pay

## 2017-04-27 MED ORDER — OXYCODONE-ACETAMINOPHEN 5-325 MG PO TABS: ORAL_TABLET | ORAL | 0 refills | Status: DC

## 2017-04-27 NOTE — Telephone Encounter (Signed)
Signed and put in envelope on your desk

## 2017-04-27 NOTE — Telephone Encounter (Signed)
Placed on your desk. Please sign. Thank you.

## 2017-04-27 NOTE — Telephone Encounter (Signed)
Tom Phelps approved. Can you advise.

## 2017-04-27 NOTE — Telephone Encounter (Signed)
Need to me sign?

## 2017-04-27 NOTE — Telephone Encounter (Signed)
Patient will be here this afternoon to pick up.

## 2017-05-04 ENCOUNTER — Encounter (INDEPENDENT_AMBULATORY_CARE_PROVIDER_SITE_OTHER): Payer: Self-pay | Admitting: Orthopaedic Surgery

## 2017-05-04 ENCOUNTER — Ambulatory Visit (INDEPENDENT_AMBULATORY_CARE_PROVIDER_SITE_OTHER): Payer: BLUE CROSS/BLUE SHIELD | Admitting: Orthopaedic Surgery

## 2017-05-04 ENCOUNTER — Ambulatory Visit (INDEPENDENT_AMBULATORY_CARE_PROVIDER_SITE_OTHER): Payer: Self-pay

## 2017-05-04 DIAGNOSIS — S92051A Displaced other extraarticular fracture of right calcaneus, initial encounter for closed fracture: Secondary | ICD-10-CM | POA: Diagnosis not present

## 2017-05-04 NOTE — Progress Notes (Signed)
   Post-Op Visit Note   Patient: Tom Phelps           Date of Birth: 1960/06/01           MRN: [id number] Visit Date: 05/04/2017 PCP: Mila Palmer, MD   Assessment & Plan:  Chief Complaint:  Chief Complaint  Patient presents with  . Right Foot - Pain    HEEL   Visit Diagnoses:  1. Displaced other extraarticular fracture of right calcaneus, initial encounter for closed fracture     Plan: Patient is 3 weeks status post depressed calcaneus fracture.  Overall he is doing better he does admit to not being that compliant with elevation.  His clinical exam shows continued moderate swelling.  No neurovascular compromise.  X-rays are stable.  At this point continue nonweightbearing and Cam walker.  Follow-up in 3 weeks with 2 view x-rays of the right calcaneus.  I encouraged the patient that he needs to elevate his foot more than what he has been doing.  Follow-Up Instructions: Return in about 3 weeks (around 05/25/2017).   Orders:  Orders Placed This Encounter  Procedures  . XR Os Calcis Right   No orders of the defined types were placed in this encounter.   Imaging: Xr Os Calcis Right  Result Date: 05/04/2017 Stable calcaneus fracture.  No interval worsening   PMFS History: There are no active problems to display for this patient.  History reviewed. No pertinent past medical history.  History reviewed. No pertinent family history.  History reviewed. No pertinent surgical history. Social History   Occupational History  . Not on file  Tobacco Use  . Smoking status: Never Smoker  . Smokeless tobacco: Never Used  Substance and Sexual Activity  . Alcohol use: Not on file  . Drug use: Not on file  . Sexual activity: Not on file

## 2017-05-13 ENCOUNTER — Telehealth (INDEPENDENT_AMBULATORY_CARE_PROVIDER_SITE_OTHER): Payer: Self-pay | Admitting: Orthopaedic Surgery

## 2017-05-13 NOTE — Telephone Encounter (Signed)
See message below °

## 2017-05-13 NOTE — Telephone Encounter (Signed)
I think this is very likely because he has had to use his left leg more

## 2017-05-13 NOTE — Telephone Encounter (Signed)
Patient has a right heel injury and has been using crutches, he believes this is why his left knee has been hurting him. He wants to know what Dr. Roda Shutters thinks and to give him a call back. # [phone]

## 2017-05-14 NOTE — Telephone Encounter (Signed)
Called patient to advise  °

## 2017-05-26 ENCOUNTER — Ambulatory Visit (INDEPENDENT_AMBULATORY_CARE_PROVIDER_SITE_OTHER): Payer: BLUE CROSS/BLUE SHIELD | Admitting: Orthopaedic Surgery

## 2017-05-26 ENCOUNTER — Encounter (INDEPENDENT_AMBULATORY_CARE_PROVIDER_SITE_OTHER): Payer: Self-pay | Admitting: Orthopaedic Surgery

## 2017-05-26 ENCOUNTER — Ambulatory Visit (INDEPENDENT_AMBULATORY_CARE_PROVIDER_SITE_OTHER): Payer: BLUE CROSS/BLUE SHIELD

## 2017-05-26 DIAGNOSIS — M79671 Pain in right foot: Secondary | ICD-10-CM

## 2017-05-26 DIAGNOSIS — S92051A Displaced other extraarticular fracture of right calcaneus, initial encounter for closed fracture: Secondary | ICD-10-CM

## 2017-05-26 NOTE — Progress Notes (Signed)
   Post-Op Visit Note   Patient: Tom Phelps           Date of Birth: 14-Jan-1960           MRN: [id number] Visit Date: 05/26/2017 PCP: Mila Palmer, MD   Assessment & Plan:  Chief Complaint:  Chief Complaint  Patient presents with  . Right Foot - Follow-up    05/04/17 displaced fracture right calcaneous   Visit Diagnoses:  1. Displaced other extraarticular fracture of right calcaneus, initial encounter for closed fracture   2. Pain of right heel     Plan: Patient is 6 weeks status post right calcaneus fracture treated nonoperatively.  He is overall feeling better.  His swelling is improved.  X-rays demonstrate stable alignment with evidence of healing.  At this point he may begin to weight-bear as tolerated.  Referral for physical therapy was made.  Follow-up in 6 weeks with 2 view x-rays of the right calcaneus.  Follow-Up Instructions: Return in about 6 weeks (around 07/07/2017).   Orders:  Orders Placed This Encounter  Procedures  . XR Foot 2 Views Right   No orders of the defined types were placed in this encounter.   Imaging: Xr Foot 2 Views Right  Result Date: 05/26/2017 Stable alignment of calcaneus.  There is evidence of bony formation and healing of the fracture lines.   PMFS History: There are no active problems to display for this patient.  History reviewed. No pertinent past medical history.  History reviewed. No pertinent family history.  History reviewed. No pertinent surgical history. Social History   Occupational History  . Not on file  Tobacco Use  . Smoking status: Never Smoker  . Smokeless tobacco: Never Used  Substance and Sexual Activity  . Alcohol use: Not on file  . Drug use: Not on file  . Sexual activity: Not on file

## 2017-07-13 ENCOUNTER — Ambulatory Visit (INDEPENDENT_AMBULATORY_CARE_PROVIDER_SITE_OTHER): Payer: BLUE CROSS/BLUE SHIELD | Admitting: Orthopaedic Surgery

## 2017-07-13 ENCOUNTER — Encounter (INDEPENDENT_AMBULATORY_CARE_PROVIDER_SITE_OTHER): Payer: Self-pay | Admitting: Orthopaedic Surgery

## 2017-07-13 ENCOUNTER — Ambulatory Visit (INDEPENDENT_AMBULATORY_CARE_PROVIDER_SITE_OTHER): Payer: Self-pay

## 2017-07-13 DIAGNOSIS — S92051A Displaced other extraarticular fracture of right calcaneus, initial encounter for closed fracture: Secondary | ICD-10-CM

## 2017-07-13 HISTORY — DX: Displaced other extraarticular fracture of right calcaneus, initial encounter for closed fracture: S92.051A

## 2017-07-13 NOTE — Progress Notes (Signed)
      Patient: Tom Phelps           Date of Birth: 1960-06-27           MRN: [id number] Visit Date: 07/13/2017 PCP: Mila PalmerWolters, Sharon, MD   Assessment & Plan:  Chief Complaint:  Chief Complaint  Patient presents with  . Right Foot - Pain, Follow-up    HEEL   Visit Diagnoses:  1. Displaced other extraarticular fracture of right calcaneus, initial encounter for closed fracture     Plan: Patient is a pleasant 57 year old gentleman who presents to our clinic today approximately 12 weeks status post right calcaneal fracture.  He has transitioned out of the cam walker into a shoe weightbearing as tolerated.  Doing well with this.  He has also been going to outpatient physical therapy where he has 1 week of sessions remaining.  He still admits to some swelling at the end of the day but has gradually decreased.  Examination of his right heel reveals no tenderness.  Moderate swelling.  Calf is soft and nontender.  He is neurovascularly intact distally.  At this point, he will finish his formal physical therapy and then wean to a home exercise program.  He will elevate for swelling.  I have also given him a prescription for compression stockings.  He will follow-up with us as needed.  Call with concerns or questions in the meantime.  Follow-Up Instructions: Return if symptoms worsen or fail to improve.   Orders:  Orders Placed This Encounter  Procedures  . XR Os Calcis Right   No orders of the defined types were placed in this encounter.   Imaging: Xr Os Calcis Right  Result Date: 07/13/2017 Stable alignment and nearly healed calcaneal fracture   PMFS History: Patient Active Problem List   Diagnosis Date Noted  . Displaced other extraarticular fracture of right calcaneus, initial encounter for closed fracture 07/13/2017   History reviewed. No pertinent past medical history.  History reviewed. No pertinent family history.  History reviewed. No pertinent surgical history. Social History     Occupational History  . Not on file  Tobacco Use  . Smoking status: Never Smoker  . Smokeless tobacco: Never Used  Substance and Sexual Activity  . Alcohol use: Not on file  . Drug use: Not on file  . Sexual activity: Not on file

## 2017-07-14 ENCOUNTER — Ambulatory Visit (INDEPENDENT_AMBULATORY_CARE_PROVIDER_SITE_OTHER): Payer: BLUE CROSS/BLUE SHIELD | Admitting: Orthopaedic Surgery

## 2017-11-18 ENCOUNTER — Ambulatory Visit (INDEPENDENT_AMBULATORY_CARE_PROVIDER_SITE_OTHER): Payer: BLUE CROSS/BLUE SHIELD | Admitting: Orthopaedic Surgery

## 2017-11-18 ENCOUNTER — Encounter (INDEPENDENT_AMBULATORY_CARE_PROVIDER_SITE_OTHER): Payer: Self-pay | Admitting: Orthopaedic Surgery

## 2017-11-18 ENCOUNTER — Ambulatory Visit (INDEPENDENT_AMBULATORY_CARE_PROVIDER_SITE_OTHER): Payer: Self-pay

## 2017-11-18 DIAGNOSIS — G8929 Other chronic pain: Secondary | ICD-10-CM

## 2017-11-18 DIAGNOSIS — M25562 Pain in left knee: Secondary | ICD-10-CM

## 2017-11-18 HISTORY — DX: Other chronic pain: G89.29

## 2017-11-18 MED ORDER — METHYLPREDNISOLONE ACETATE 40 MG/ML IJ SUSP
13.3300 mg | INTRAMUSCULAR | Status: AC | PRN
Start: 2017-11-18 — End: 2017-11-18
  Administered 2017-11-18: 13.33 mg via INTRA_ARTICULAR

## 2017-11-18 MED ORDER — BUPIVACAINE HCL 0.25 % IJ SOLN
0.6600 mL | INTRAMUSCULAR | Status: AC | PRN
Start: 2017-11-18 — End: 2017-11-18
  Administered 2017-11-18: .66 mL via INTRA_ARTICULAR

## 2017-11-18 MED ORDER — LIDOCAINE HCL 1 % IJ SOLN
0.5000 mL | INTRAMUSCULAR | Status: AC | PRN
  Administered 2017-11-18: .5 mL

## 2017-11-18 NOTE — Progress Notes (Signed)
   Office Visit Note   Patient: Tom Phelps           Date of Birth: 1960/09/22           MRN: [id number] Visit Date: 11/18/2017              Requested by: Mila PalmerWolters, Sharon, MD [address] [place], [place] [address] PCP: Mila PalmerWolters, Sharon, MD   Assessment & Plan: Visit Diagnoses:  1. Chronic pain of left knee     Plan: Impression is left knee osteoarthritis.  Today, we will proceed with an intra-articular cortisone injection.  He will take it easy over the next few days.  I also provided him with a viscosupplementation handout.  He will follow-up with us as needed.  Follow-Up Instructions: Return if symptoms worsen or fail to improve.   Orders:  Orders Placed This Encounter  Procedures  . Large Joint Inj: L knee  . XR KNEE 3 VIEW LEFT   No orders of the defined types were placed in this encounter.     Procedures: Large Joint Inj: L knee on 11/18/2017 2:20 PM Medications: 0.66 mL bupivacaine 0.25 %; 0.5 mL lidocaine 1 %; 13.33 mg methylPREDNISolone acetate 40 MG/ML      Clinical Data: No additional findings.   Subjective: Chief Complaint  Patient presents with  . Left Knee - Pain    HPI patient is a pleasant 57 year old gentleman who presents to our clinic today with left knee pain.  This is been ongoing for several months.  It worsened back in April 2019 following a right calcaneus fracture.  The pain he has is to the entire left knee.  Pain does appear to be worse going off his will is down stairs and hills.  He does have associated muscle cramping.  He has been taking pain medication from his PCP which does seem to dull the pain.  He denies any previous cortisone injection or surgical intervention to the left knee.  Review of Systems as detailed in HPI.  All others reviewed and are negative.   Objective: Vital Signs: There were no vitals taken for this visit.  Physical Exam well-developed well-nourished gentleman in no acute distress.  Alert and  oriented x3.  Ortho Exam termination of left knee reveals no effusion.  Range of motion 0 to 125 degrees.  Minimal medial joint line tenderness.  Moderate patellofemoral crepitus.  Ligaments are stable.  He is neurovascularly intact distally.  Specialty Comments:  No specialty comments available.  Imaging: Xr Knee 3 View Left  Result Date: 11/18/2017 Moderate medial and patella femoral compartment joint space narrowing    PMFS History: Patient Active Problem List   Diagnosis Date Noted  . Chronic pain of left knee 11/18/2017  . Displaced other extraarticular fracture of right calcaneus, initial encounter for closed fracture 07/13/2017   History reviewed. No pertinent past medical history.  History reviewed. No pertinent family history.  History reviewed. No pertinent surgical history. Social History   Occupational History  . Not on file  Tobacco Use  . Smoking status: Never Smoker  . Smokeless tobacco: Never Used  Substance and Sexual Activity  . Alcohol use: Not on file  . Drug use: Not on file  . Sexual activity: Not on file

## 2017-12-07 ENCOUNTER — Telehealth (INDEPENDENT_AMBULATORY_CARE_PROVIDER_SITE_OTHER): Payer: Self-pay | Admitting: Orthopaedic Surgery

## 2017-12-07 NOTE — Telephone Encounter (Signed)
Ok to submit for gel inj?

## 2017-12-07 NOTE — Telephone Encounter (Signed)
yes

## 2017-12-07 NOTE — Telephone Encounter (Signed)
Patient called this morning stating that the cortisone injection did not work for him and would like to proceed with the gel injection.  CB#[phone].  Thank you.

## 2017-12-08 ENCOUNTER — Telehealth (INDEPENDENT_AMBULATORY_CARE_PROVIDER_SITE_OTHER): Payer: Self-pay

## 2017-12-08 NOTE — Telephone Encounter (Signed)
Message sent in error

## 2017-12-08 NOTE — Telephone Encounter (Signed)
Please submit for gel inj thank you.

## 2017-12-08 NOTE — Telephone Encounter (Signed)
Submitted VOB for SynviscOne, left knee. 

## 2017-12-08 NOTE — Telephone Encounter (Signed)
Noted  

## 2018-01-07 ENCOUNTER — Ambulatory Visit: Payer: BLUE CROSS/BLUE SHIELD

## 2018-01-14 ENCOUNTER — Ambulatory Visit: Payer: BLUE CROSS/BLUE SHIELD

## 2018-01-21 ENCOUNTER — Ambulatory Visit: Payer: BLUE CROSS/BLUE SHIELD

## 2018-02-09 ENCOUNTER — Ambulatory Visit: Payer: BLUE CROSS/BLUE SHIELD | Admitting: *Deleted

## 2018-02-17 ENCOUNTER — Encounter: Payer: Medicare HMO | Attending: Family Medicine | Admitting: *Deleted

## 2018-02-17 DIAGNOSIS — E119 Type 2 diabetes mellitus without complications: Secondary | ICD-10-CM | POA: Diagnosis not present

## 2018-02-17 NOTE — Patient Instructions (Signed)
Plan:  Aim for 3 Carb Choices per meal (45 grams) +/- 1 either way  Aim for 0-2 Carbs per snack if hungry  Include protein in moderation with your meals and snacks Consider reading food labels for Total Carbohydrate of foods Continue with your activity level by walking for 60 minutes daily as tolerated Consider checking BG at alternate times, including after meals and after exercise occasionally

## 2018-02-17 NOTE — Progress Notes (Signed)
Diabetes Self-Management Education  Visit Type: First/Initial  Appt. Start Time: 1115 Appt. End Time: 1215  02/17/2018  [name], identified by name and date of birth, is a 58 y.o. male with a diagnosis of Diabetes: Type 2. He is newly diagnosed, he is physically active daily and eats 2 meals a day with occasional snacks. He does not check his BG yet but would like to learn more about it. His wife prepares his main meal every day.  ASSESSMENT  Height 5\' 10"  (1.778 m), weight 202 lb (91.6 kg). Body mass index is 28.98 kg/m.  Diabetes Self-Management Education - 02/17/18 1111      Visit Information   Visit Type  First/Initial      Initial Visit   Diabetes Type  Type 2    Are you currently following a meal plan?  No    Are you taking your medications as prescribed?  Not on Medications    Date Diagnosed  11/2017      Health Coping   How would you rate your overall health?  Good      Psychosocial Assessment   Patient Belief/Attitude about Diabetes  Motivated to manage diabetes    Self-care barriers  None    Self-management support  Family    Other persons present  Patient    Patient Concerns  Nutrition/Meal planning    Special Needs  None    Preferred Learning Style  No preference indicated    Learning Readiness  Ready    How often do you need to have someone help you when you read instructions, pamphlets, or other written materials from your doctor or pharmacy?  1 - Never    What is the last grade level you completed in school?  12      Pre-Education Assessment   Patient understands the diabetes disease and treatment process.  Needs Instruction    Patient understands incorporating nutritional management into lifestyle.  Needs Instruction    Patient undertands incorporating physical activity into lifestyle.  Needs Instruction    Patient understands using medications safely.  Needs Instruction    Patient understands monitoring blood glucose, interpreting and using results   Needs Instruction    Patient understands prevention, detection, and treatment of acute complications.  Needs Instruction    Patient understands prevention, detection, and treatment of chronic complications.  Needs Instruction    Patient understands how to develop strategies to address psychosocial issues.  Needs Instruction    Patient understands how to develop strategies to promote health/change behavior.  Needs Instruction      Complications   Last HgB A1C per patient/outside source  6.6 %    How often do you check your blood sugar?  0 times/day (not testing)    Have you had a dilated eye exam in the past 12 months?  Yes    Have you had a dental exam in the past 12 months?  No    Are you checking your feet?  No      Dietary Intake   Breakfast  1 slice bread, goat cheese, butter, jam most days, occasionally an egg    Snack (morning)  no    Lunch  no    Dinner  4 PM: vegetables, starch, very little meat, occasionally bread, salad with cucumber, onion, tomatoes    Snack (evening)  occasionally fresh fruit or nuts    Beverage(s)  hot tea with 1 1/2 tsp sugar, water, hot tea again in evening  Exercise   Exercise Type  Light (walking / raking leaves)    How many days per week to you exercise?  7    How many minutes per day do you exercise?  60    Total minutes per week of exercise  420      Patient Education   Previous Diabetes Education  No    Disease state   Definition of diabetes, type 1 and 2, and the diagnosis of diabetes;Factors that contribute to the development of diabetes    Nutrition management   Role of diet in the treatment of diabetes and the relationship between the three main macronutrients and blood glucose level;Food label reading, portion sizes and measuring food.;Carbohydrate counting;Reviewed blood glucose goals for pre and post meals and how to evaluate the patients' food intake on their blood glucose level.    Physical activity and exercise   Role of exercise on  diabetes management, blood pressure control and cardiac health.    Monitoring  Purpose and frequency of SMBG.;Identified appropriate SMBG and/or A1C goals.    Chronic complications  Relationship between chronic complications and blood glucose control      Individualized Goals (developed by patient)   Nutrition  General guidelines for healthy choices and portions discussed    Physical Activity  Exercise 3-5 times per week    Medications  Not Applicable    Monitoring   test blood glucose pre and post meals as discussed      Post-Education Assessment   Patient understands the diabetes disease and treatment process.  Demonstrates understanding / competency    Patient understands incorporating nutritional management into lifestyle.  Demonstrates understanding / competency    Patient undertands incorporating physical activity into lifestyle.  Demonstrates understanding / competency    Patient understands monitoring blood glucose, interpreting and using results  Demonstrates understanding / competency    Patient understands prevention, detection, and treatment of acute complications.  Demonstrates understanding / competency    Patient understands prevention, detection, and treatment of chronic complications.  Demonstrates understanding / competency    Patient understands how to develop strategies to address psychosocial issues.  Demonstrates understanding / competency    Patient understands how to develop strategies to promote health/change behavior.  Demonstrates understanding / competency      Outcomes   Expected Outcomes  Demonstrated interest in learning. Expect positive outcomes    Future DMSE  PRN    Program Status  Completed       Individualized Plan for Diabetes Self-Management Training:   Learning Objective:  Patient will have a greater understanding of diabetes self-management. Patient education plan is to attend individual and/or group sessions per assessed needs and concerns.    Plan:   Patient Instructions  Plan:  Aim for 3 Carb Choices per meal (45 grams) +/- 1 either way  Aim for 0-2 Carbs per snack if hungry  Include protein in moderation with your meals and snacks Consider reading food labels for Total Carbohydrate of foods Continue with your activity level by walking for 60 minutes daily as tolerated Consider checking BG at alternate times, including after meals and after exercise occasionally    Expected Outcomes:  Demonstrated interest in learning. Expect positive outcomes  Education material provided: ADA Diabetes: Your Take Control Guide, A1C conversion sheet, Meal plan card and Carbohydrate counting sheet  If problems or questions, patient to contact team via:  Phone  Future DSME appointment: PRN

## 2018-04-20 DIAGNOSIS — R51 Headache: Secondary | ICD-10-CM | POA: Diagnosis not present

## 2018-04-20 DIAGNOSIS — E1140 Type 2 diabetes mellitus with diabetic neuropathy, unspecified: Secondary | ICD-10-CM | POA: Diagnosis not present

## 2018-04-20 DIAGNOSIS — G459 Transient cerebral ischemic attack, unspecified: Secondary | ICD-10-CM | POA: Diagnosis not present

## 2018-04-20 DIAGNOSIS — I10 Essential (primary) hypertension: Secondary | ICD-10-CM | POA: Diagnosis not present

## 2018-05-04 ENCOUNTER — Other Ambulatory Visit: Payer: Self-pay | Admitting: Family Medicine

## 2018-05-04 ENCOUNTER — Other Ambulatory Visit: Payer: Self-pay

## 2018-05-04 ENCOUNTER — Ambulatory Visit
Admission: RE | Admit: 2018-05-04 | Discharge: 2018-05-04 | Disposition: A | Discharge status: Home / Self Care | Payer: Medicare HMO | Source: Ambulatory Visit | Attending: Family Medicine | Admitting: Family Medicine

## 2018-05-04 DIAGNOSIS — I10 Essential (primary) hypertension: Secondary | ICD-10-CM | POA: Diagnosis not present

## 2018-05-04 DIAGNOSIS — M9981 Other biomechanical lesions of cervical region: Principal | ICD-10-CM

## 2018-05-04 DIAGNOSIS — M542 Cervicalgia: Secondary | ICD-10-CM | POA: Diagnosis not present

## 2018-05-04 DIAGNOSIS — M4802 Spinal stenosis, cervical region: Secondary | ICD-10-CM

## 2018-05-04 DIAGNOSIS — E1169 Type 2 diabetes mellitus with other specified complication: Secondary | ICD-10-CM | POA: Diagnosis not present

## 2018-05-04 DIAGNOSIS — M5030 Other cervical disc degeneration, unspecified cervical region: Secondary | ICD-10-CM | POA: Diagnosis not present

## 2018-05-11 ENCOUNTER — Telehealth: Payer: Self-pay

## 2018-05-11 NOTE — Telephone Encounter (Signed)
Submitted VOB for Gel-One, left knee. 

## 2018-05-17 ENCOUNTER — Telehealth: Payer: Self-pay

## 2018-05-17 NOTE — Telephone Encounter (Signed)
PA required for Gel-One, left knee. PA was initiated on 05/12/2018 with [employer]. Reference#[id number]  Per [employer], Gel-One was denied for left knee, due to x-ray not showing osteoarthritis of left knee and patient not trying/failing physical therapy.

## 2018-05-18 DIAGNOSIS — I10 Essential (primary) hypertension: Secondary | ICD-10-CM | POA: Diagnosis not present

## 2018-05-18 DIAGNOSIS — Z7984 Long term (current) use of oral hypoglycemic drugs: Secondary | ICD-10-CM | POA: Diagnosis not present

## 2018-05-18 DIAGNOSIS — E1140 Type 2 diabetes mellitus with diabetic neuropathy, unspecified: Secondary | ICD-10-CM | POA: Diagnosis not present

## 2018-05-19 ENCOUNTER — Telehealth: Payer: Self-pay | Admitting: Orthopaedic Surgery

## 2018-05-19 DIAGNOSIS — M1712 Unilateral primary osteoarthritis, left knee: Secondary | ICD-10-CM | POA: Diagnosis not present

## 2018-05-19 NOTE — Telephone Encounter (Signed)
Patient called asked if he can pick up a copy of the X-ray of his left knee. The X-ray was done in October or November 2019. Advised patient there is a $5.00 charge. Patient said he has an appointment at 3:00pm today. The number to contact patient is [phone]

## 2018-05-19 NOTE — Telephone Encounter (Signed)
Called patient and advised that CD was ready for pickup at front desk.  °

## 2018-06-04 ENCOUNTER — Telehealth: Payer: Self-pay

## 2018-06-04 DIAGNOSIS — M25562 Pain in left knee: Secondary | ICD-10-CM | POA: Diagnosis not present

## 2018-06-04 DIAGNOSIS — M1712 Unilateral primary osteoarthritis, left knee: Secondary | ICD-10-CM | POA: Diagnosis not present

## 2018-06-04 NOTE — Telephone Encounter (Signed)
Virtual Visit Pre-Appointment Phone Call  TELEPHONE CALL NOTE  Tom Phelps has been deemed a candidate for a follow-up tele-health visit to limit community exposure during the Covid-19 pandemic. I spoke with the patient via phone to ensure availability of phone/video source, confirm preferred email & phone number, and discuss instructions and expectations.  I reminded Tom Phelps to be prepared with any vital sign and/or heart rhythm information that could potentially be obtained via home monitoring, at the time of his visit. I reminded Tom Phelps to expect a phone call prior to his visit.  Patient agrees to consent below.  Lattie HawBrittany I Asako Saliba, RN 06/04/2018 5:22 PM    FULL LENGTH CONSENT FOR TELE-HEALTH VISIT   I hereby voluntarily request, consent and authorize [facility] and its employed or contracted physicians, physician assistants, nurse practitioners or other licensed health care professionals (the Practitioner), to provide me with telemedicine health care services (the "Services") as deemed necessary by the treating Practitioner. I acknowledge and consent to receive the Services by the Practitioner via telemedicine. I understand that the telemedicine visit will involve communicating with the Practitioner through live audiovisual communication technology and the disclosure of certain medical information by electronic transmission. I acknowledge that I have been given the opportunity to request an in-person assessment or other available alternative prior to the telemedicine visit and am voluntarily participating in the telemedicine visit.  I understand that I have the right to withhold or withdraw my consent to the use of telemedicine in the course of my care at any time, without affecting my right to future care or treatment, and that the Practitioner or I may terminate the telemedicine visit at any time. I understand that I have the right to inspect all information obtained and/or recorded  in the course of the telemedicine visit and may receive copies of available information for a reasonable fee.  I understand that some of the potential risks of receiving the Services via telemedicine include:  Marland Kitchen. Delay or interruption in medical evaluation due to technological equipment failure or disruption; . Information transmitted may not be sufficient (e.g. poor resolution of images) to allow for appropriate medical decision making by the Practitioner; and/or  . In rare instances, security protocols could fail, causing a breach of personal health information.  Furthermore, I acknowledge that it is my responsibility to provide information about my medical history, conditions and care that is complete and accurate to the best of my ability. I acknowledge that Practitioner's advice, recommendations, and/or decision may be based on factors not within their control, such as incomplete or inaccurate data provided by me or distortions of diagnostic images or specimens that may result from electronic transmissions. I understand that the practice of medicine is not an exact science and that Practitioner makes no warranties or guarantees regarding treatment outcomes. I acknowledge that I will receive a copy of this consent concurrently upon execution via email to the email address I last provided but may also request a printed copy by calling the office of [facility].    I understand that my insurance will be billed for this visit.   I have read or had this consent read to me. . I understand the contents of this consent, which adequately explains the benefits and risks of the Services being provided via telemedicine.  . I have been provided ample opportunity to ask questions regarding this consent and the Services and have had my questions answered to my satisfaction. . I give my  informed consent for the services to be provided through the use of telemedicine in my medical care  By participating in this  telemedicine visit I agree to the above.

## 2018-06-06 NOTE — Progress Notes (Signed)
Virtual Visit via Video Note   This visit type was conducted due to national recommendations for restrictions regarding the COVID-19 Pandemic (e.g. social distancing) in an effort to limit this patient's exposure and mitigate transmission in our community.  Due to his co-morbid illnesses, this patient is at least at moderate risk for complications without adequate follow up.  This format is felt to be most appropriate for this patient at this time.  All issues noted in this document were discussed and addressed.  A limited physical exam was performed with this format.  Please refer to the patient's chart for his consent to telehealth for Tom Phelps.   Date:  06/07/2018   ID:  Tom Phelps, DOB Sep 01, 1960, MRN [id number]  Patient Location: Home Provider Location: Home  PCP:  Tom Palmer, MD  Cardiologist:  No primary care provider on file. Tom Phelps Electrophysiologist:  None   Evaluation Performed:  Consultation - Tom Phelps was referred by Dr. Paulino Rily for the evaluation of Tom Phelps, chest discomfort.  Chief Complaint:  Chest discomfort  History of Present Illness:    Tom Phelps is a 58 y.o. male with h/o TIA, he was started on Plavix.  Over the past few months, he has had exertional SHOB and CP. It is better with rest. No associated sweating, nausea.  He has developed HTN and DM treated with medications.   2019 chest CT showed coronary calcifications.   He has had several people with CAD in the family.  Sister died a few years ago. Mother needed heart surgery, but she passed away before surgery.  The patient does not have symptoms concerning for COVID-19 infection (fever, chills, cough, or new shortness of breath).    Past Medical History:  Diagnosis Date  . Actinic keratoses   . Ankle dislocation    calcaneal tendons  . Anxiety   . Aortic atherosclerosis (HCC)   . Chronic pain of left knee 11/18/2017  . Coronary artery disease   . DDD (degenerative disc disease), cervical    . Diabetes mellitus without complication (HCC)    2019  . Displaced other extraarticular fracture of right calcaneus, initial encounter for closed fracture 07/13/2017  . DOE (dyspnea on exertion)   . Fatty liver    mild  . Fracture of distal end of right fibula   . Hypercholesteremia   . Hypercholesterolemia   . Hypertension   . Lightheadedness   . Neck pain   . Prediabetes   . Pulmonary nodules   . Stress reaction   . TIA (transient ischemic attack)    circulation    No past surgical history on file.   Current Meds  Medication Sig  . clopidogrel (PLAVIX) 75 MG tablet Take 75 mg by mouth daily.  Marland Kitchen lisinopril-hydrochlorothiazide (ZESTORETIC) 10-12.5 MG tablet Take 1 tablet by mouth daily.  . metFORMIN (GLUCOPHAGE-XR) 500 MG 24 hr tablet Take 500 mg by mouth daily with breakfast.  . simvastatin (ZOCOR) 40 MG tablet Take 40 mg by mouth daily.     Allergies:   Oxycodone   Social History   Tobacco Use  . Smoking status: Never Smoker  . Smokeless tobacco: Never Used  Substance Use Topics  . Alcohol use: Never    Frequency: Never  . Drug use: Never     Family Hx: [name] family history includes CAD in his father and mother.  ROS:   Please see the history of present illness.     All other systems reviewed and are negative.  Prior CV studies:   The following studies were reviewed today:    Labs/Other Tests and Data Reviewed:    EKG:  No ECG reviewed.  Recent Labs: No results found for requested labs within last 8760 hours.   Recent Lipid Panel No results found for: CHOL, TRIG, HDL, CHOLHDL, LDLCALC, LDLDIRECT  Wt Readings from Last 3 Encounters:  06/07/18 190 lb (86.2 kg)  02/17/18 202 lb (91.6 kg)  04/14/17 195 lb (88.5 kg)     Objective:    Vital Signs:  BP (!) 134/94   Pulse 86   Ht 5\' 10"  (1.778 m)   Wt 190 lb (86.2 kg)   BMI 27.26 kg/m    VITAL SIGNS:  reviewed GEN:  no acute distress RESPIRATORY:  normal respiratory effort,  symmetric expansion PSYCH:  normal affect limited exam by video  ASSESSMENT & PLAN:    1. Chest discomfort: Some typical features with RF for CAD.  Plan for coronary CT.  2. DOE: Check echo. 3. Family h/o CAD: Several family members with heart issues.  4. DM: COntinue healthy diet.  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   COVID-19 Education: The signs and symptoms of COVID-19 were discussed with the patient and how to seek care for testing (follow up with PCP or arrange E-visit).  The importance of social distancing was discussed today.  Time:   Today, I have spent 25 minutes with the patient with telehealth technology discussing the above problems.     Medication Adjustments/Labs and Tests Ordered: Current medicines are reviewed at length with the patient today.  Concerns regarding medicines are outlined above.   Tests Ordered: No orders of the defined types were placed in this encounter.   Medication Changes: No orders of the defined types were placed in this encounter.   Disposition:  Follow up for CT and echo  Signed, Lance MussJayadeep Anda Sobotta, MD  06/07/2018 11:50 AM    [facility]

## 2018-06-07 ENCOUNTER — Other Ambulatory Visit: Payer: Self-pay

## 2018-06-07 ENCOUNTER — Telehealth (INDEPENDENT_AMBULATORY_CARE_PROVIDER_SITE_OTHER): Payer: Medicare HMO | Admitting: Interventional Cardiology

## 2018-06-07 ENCOUNTER — Encounter: Payer: Self-pay | Admitting: Interventional Cardiology

## 2018-06-07 VITALS — BP 134/94 | HR 86 | Ht 70.0 in | Wt 190.0 lb

## 2018-06-07 DIAGNOSIS — R072 Precordial pain: Secondary | ICD-10-CM | POA: Diagnosis not present

## 2018-06-07 DIAGNOSIS — E119 Type 2 diabetes mellitus without complications: Secondary | ICD-10-CM

## 2018-06-07 DIAGNOSIS — R0609 Other forms of dyspnea: Secondary | ICD-10-CM

## 2018-06-07 MED ORDER — METOPROLOL TARTRATE 100 MG PO TABS: ORAL_TABLET | ORAL | 0 refills | Status: DC

## 2018-06-07 NOTE — Patient Instructions (Signed)
Medication Instructions:  Your physician recommends that you continue on your current medications as directed. Please refer to the Current Medication list given to you today.  If you need a refill on your cardiac medications before your next appointment, please call your pharmacy.   Lab work: None Ordered  If you have labs (blood work) drawn today and your tests are completely normal, you will receive your results only by: Marland Kitchen MyChart Message (if you have MyChart) OR . A paper copy in the mail If you have any lab test that is abnormal or we need to change your treatment, we will call you to review the results.  Testing/Procedures: Your physician has requested that you have an echocardiogram. Echocardiography is a painless test that uses sound waves to create images of your heart. It provides your doctor with information about the size and shape of your heart and how well your heart's chambers and valves are working. This procedure takes approximately one hour. There are no restrictions for this procedure.  Your physician has requested that you have cardiac CT. Cardiac computed tomography (CT) is a painless test that uses an x-ray machine to take clear, detailed pictures of your heart. For further information please visit [email or web address] Please follow instruction sheet as given.   Follow-Up: . Follow up with Dr. Eldridge Dace via VIDEO Visit on 07/20/18 at 2:40 PM  Any Other Special Instructions Will Be Listed Below (If Applicable).  CARDIAC CT INSTRUCTIONS Please arrive at the [facility] main entrance of [facility] on ___ at ___ (30-45 minutes prior to test start time)  [facility] [address] [place], [place] [address] [phone]  Proceed to the [facility] Radiology Department (First Floor).  Please follow these instructions carefully (unless otherwise directed):   On the Night Before the Test: . Be sure to Drink plenty of water. . Do not consume any  caffeinated/decaffeinated beverages or chocolate 12 hours prior to your test. . Do not take any antihistamines 12 hours prior to your test. . If you take Metformin do not take 24 hours prior to test.   On the Day of the Test: . Drink plenty of water. Do not drink any water within one hour of the test. . Do not eat any food 4 hours prior to the test. . You may take your regular medications prior to the test.  . Take metoprolol (Lopressor) two hours prior to test. . HOLD Lisinopril-Hydrochlorothiazide the morning of the test. . HOLD Metformin the day of your test     After the Test: . Drink plenty of water. . After receiving IV contrast, you may experience a mild flushed feeling. This is normal. . On occasion, you may experience a mild rash up to 24 hours after the test. This is not dangerous. If this occurs, you can take Benadryl 25 mg and increase your fluid intake. . If you experience trouble breathing, this can be serious. If it is severe call 911 IMMEDIATELY. If it is mild, please call our office. . If you take any of these medications: Glipizide/Metformin, Avandament, Glucavance, please do not take 48 hours after completing test.

## 2018-06-09 ENCOUNTER — Telehealth: Payer: Self-pay | Admitting: Interventional Cardiology

## 2018-06-09 DIAGNOSIS — M1712 Unilateral primary osteoarthritis, left knee: Secondary | ICD-10-CM | POA: Diagnosis not present

## 2018-06-09 DIAGNOSIS — R05 Cough: Secondary | ICD-10-CM | POA: Diagnosis not present

## 2018-06-09 DIAGNOSIS — M25562 Pain in left knee: Secondary | ICD-10-CM | POA: Diagnosis not present

## 2018-06-09 DIAGNOSIS — I10 Essential (primary) hypertension: Secondary | ICD-10-CM | POA: Diagnosis not present

## 2018-06-09 DIAGNOSIS — T464X5A Adverse effect of angiotensin-converting-enzyme inhibitors, initial encounter: Secondary | ICD-10-CM | POA: Diagnosis not present

## 2018-06-09 NOTE — Telephone Encounter (Signed)
Called and spoke to patient. Echo scheduled for 06/21/18 at 7:30 AM. Still waiting on approval through insurance before we can schedule Cardiac CT.

## 2018-06-09 NOTE — Telephone Encounter (Signed)
Patient called the office to try and schedule his tests on his next day off, Tuesday 06/09. He had a Telemedicine visit with Dr. Eldridge Dace on 06/01, and a CT and an echo were both ordered.  He has limited availability due to his work schedule.

## 2018-06-16 DIAGNOSIS — M1712 Unilateral primary osteoarthritis, left knee: Secondary | ICD-10-CM | POA: Diagnosis not present

## 2018-06-16 DIAGNOSIS — M25562 Pain in left knee: Secondary | ICD-10-CM | POA: Diagnosis not present

## 2018-06-18 ENCOUNTER — Telehealth (HOSPITAL_COMMUNITY): Payer: Self-pay | Admitting: Radiology

## 2018-06-18 NOTE — Telephone Encounter (Signed)

## 2018-06-21 ENCOUNTER — Ambulatory Visit (HOSPITAL_COMMUNITY): Payer: Medicare HMO | Attending: Cardiovascular Disease

## 2018-06-21 ENCOUNTER — Other Ambulatory Visit: Payer: Self-pay

## 2018-06-21 DIAGNOSIS — R0609 Other forms of dyspnea: Secondary | ICD-10-CM | POA: Diagnosis not present

## 2018-06-22 DIAGNOSIS — I10 Essential (primary) hypertension: Secondary | ICD-10-CM | POA: Diagnosis not present

## 2018-06-23 DIAGNOSIS — M25562 Pain in left knee: Secondary | ICD-10-CM | POA: Diagnosis not present

## 2018-06-23 DIAGNOSIS — M1712 Unilateral primary osteoarthritis, left knee: Secondary | ICD-10-CM | POA: Diagnosis not present

## 2018-06-25 NOTE — Telephone Encounter (Signed)
Cardiac CT scheduled for 6/30

## 2018-07-04 ENCOUNTER — Other Ambulatory Visit (HOSPITAL_COMMUNITY): Payer: Self-pay | Admitting: Emergency Medicine

## 2018-07-04 DIAGNOSIS — R072 Precordial pain: Secondary | ICD-10-CM

## 2018-07-05 ENCOUNTER — Telehealth (HOSPITAL_COMMUNITY): Payer: Self-pay | Admitting: Emergency Medicine

## 2018-07-05 MED ORDER — METOPROLOL TARTRATE 100 MG PO TABS: ORAL_TABLET | ORAL | 0 refills | Status: DC

## 2018-07-05 NOTE — Telephone Encounter (Signed)
Left message on voicemail with name and callback number Zemira Zehring RN Navigator Cardiac Imaging [facility] [phone] Office [phone] Cell  

## 2018-07-05 NOTE — Telephone Encounter (Signed)
Called and made patient aware that another RX for his metoprolol was sent in.

## 2018-07-05 NOTE — Addendum Note (Signed)
Addended by: Drue Novel I on: 07/05/2018 02:23 PM   Modules accepted: Orders

## 2018-07-05 NOTE — Telephone Encounter (Signed)
Pt returning phone call regarding upcoming cardiac imaging study; pt verbalizes understanding of appt date/time, parking situation and where to check in, pre-test NPO status and medications ordered, and verified current allergies; name and call back number provided for further questions should they arise Marchia Bond RN Navigator Cardiac [facility] [phone] office [phone] cell  Pt states he took the dose of metoprolol for echo on 6/15, will need new prescription for metoprolol for CCTA on tomorrow 6/30  Pt denies covid symptoms, verbalized understanding of visitor policy.

## 2018-07-06 ENCOUNTER — Other Ambulatory Visit: Payer: Self-pay

## 2018-07-06 ENCOUNTER — Ambulatory Visit (HOSPITAL_COMMUNITY)
Admission: RE | Admit: 2018-07-06 | Discharge: 2018-07-06 | Disposition: A | Discharge status: Home / Self Care | Payer: Medicare HMO | Source: Ambulatory Visit | Attending: Interventional Cardiology | Admitting: Interventional Cardiology

## 2018-07-06 ENCOUNTER — Ambulatory Visit (HOSPITAL_COMMUNITY): Payer: Medicare HMO

## 2018-07-06 DIAGNOSIS — R072 Precordial pain: Secondary | ICD-10-CM | POA: Diagnosis present

## 2018-07-06 DIAGNOSIS — R0609 Other forms of dyspnea: Secondary | ICD-10-CM

## 2018-07-06 LAB — POCT I-STAT CREATININE: Creatinine, Ser: 0.9 mg/dL (ref 0.61–1.24)

## 2018-07-06 MED ORDER — NITROGLYCERIN 0.4 MG SL SUBL
0.8000 mg | SUBLINGUAL_TABLET | Freq: Once | SUBLINGUAL | Status: AC
  Administered 2018-07-06: 08:00:00 0.8 mg via SUBLINGUAL
  Filled 2018-07-06: qty 25

## 2018-07-06 MED ORDER — IOHEXOL 350 MG/ML SOLN
100.0000 mL | Freq: Once | INTRAVENOUS | Status: AC | PRN
  Administered 2018-07-06: 100 mL via INTRAVENOUS

## 2018-07-06 MED ORDER — NITROGLYCERIN 0.4 MG SL SUBL
SUBLINGUAL_TABLET | SUBLINGUAL | Status: AC
  Administered 2018-07-06: 08:00:00 0.8 mg via SUBLINGUAL
  Filled 2018-07-06: qty 2

## 2018-07-08 ENCOUNTER — Encounter: Payer: Self-pay | Admitting: *Deleted

## 2018-07-08 ENCOUNTER — Telehealth: Payer: Self-pay | Admitting: Interventional Cardiology

## 2018-07-08 DIAGNOSIS — Z01812 Encounter for preprocedural laboratory examination: Secondary | ICD-10-CM

## 2018-07-08 DIAGNOSIS — R072 Precordial pain: Secondary | ICD-10-CM

## 2018-07-08 MED ORDER — METOPROLOL TARTRATE 25 MG PO TABS: 25.0000 mg | ORAL_TABLET | Freq: Two times a day (BID) | ORAL | 1 refills | Status: DC

## 2018-07-08 MED ORDER — NITROGLYCERIN 0.4 MG SL SUBL: 0.4000 mg | SUBLINGUAL_TABLET | SUBLINGUAL | 6 refills | Status: DC | PRN

## 2018-07-08 NOTE — Telephone Encounter (Signed)
    I spoke to the patient about his CT results.  He needs a cardiac cath next week.  I am on vacation so it is fine to schedule with one of the other interventionalists.    Please call in Metoprolol 25 mg BID and a prescription for SL NTG.    He agrees to have the cath.  All questions about cath answered.   Jettie Booze, MD

## 2018-07-08 NOTE — Addendum Note (Signed)
Addended by: Thompson Grayer on: 07/08/2018 05:10 PM   Modules accepted: Orders

## 2018-07-08 NOTE — Telephone Encounter (Addendum)
I spoke with pt and gave him information from Dr. Irish Lack regarding metoprolol and NTG.  I explained to pt how to use NTG.  Will send prescription to [facility] on [address]. Pt is unable to quarantine next week so would like to have covid testing done on July 10 and cath on July 14 Cath scheduled for July 14 at 9:00 with Dr. Irish Lack.  Pt will have lab work done in our office on July 10 at 2:45 and covid testing done at [facility] through on July 10 at 3:20.   Pt aware lab work will need to be done first and he will have to quarantine after covid testing until time of cath procedure. I verbally went over all cath instructions with him. Will leave printed copy downstairs for him to pick up when he comes to office for lab work. I sent my chart code to pt. He will sign up for my chart and then send [place] a message so we can send cath instructions to him through my chart. Pt reports he is no longer taking metformin

## 2018-07-08 NOTE — Progress Notes (Signed)
    [facility] OFFICE [place], [place] [address] Dept: [phone] Loc: [phone]  Bud Kaeser  07/08/2018  You are scheduled for a Cardiac Catheterization on Tuesday, July 14 with Dr. Larae Grooms.  1. Please arrive at the [facility] (Main Entrance A) at [facility]: [address] [place], [place] [address] at 7:00 AM (This time is two hours before your procedure to ensure your preparation). Free valet parking service is available.   Special note: Every effort is made to have your procedure done on time. Please understand that emergencies sometimes delay scheduled procedures.  2. Diet: Do not eat solid foods after midnight.  The patient may have clear liquids until 5am upon the day of the procedure.  3. Labs: You will need to have blood drawn on Friday, July 10 at [facility] at [facility] [address]  Lab appointment is 2:45    4. Medication instructions in preparation for your procedure:  Do not take metformin morning of the procedure and for 48 hours after the procedure. Do not take lisinopril -hydrochlorothiazide the morning of the procedure.   Contrast Allergy: No  On the morning of your procedure, take your Plavix/Clopidogrel and Asprin 81 mg and any morning medicines NOT listed above.  You may use sips of water.  5. Plan for one night stay--bring personal belongings. 6. Bring a current list of your medications and current insurance cards. 7. You MUST have a responsible person to drive you home. 8. Someone MUST be with you the first 24 hours after you arrive home or your discharge will be delayed. 9. Please wear clothes that are easy to get on and off and wear slip-on shoes.  Covid testing is scheduled for July 10,2020 at 3:20 at [facility] drive through.  Thank you for allowing [place] to care for you!   -- [facility]

## 2018-07-12 ENCOUNTER — Telehealth: Payer: Self-pay | Admitting: Interventional Cardiology

## 2018-07-12 ENCOUNTER — Encounter: Payer: Self-pay | Admitting: *Deleted

## 2018-07-12 NOTE — Telephone Encounter (Signed)
Called and spoke to patient. Reviewed cath instructions below with the patient. Patient is no longer taking metformin. Removed from med list. Pre-Cath instructions sent through [place] and letter printed out front for patient to pick up when he comes for labs on 7/10. Appt with JV on 7/14 has been rescheduled for 7/22 at 1:40 PM. ER precautions reviewed with the patient. Patient verbalized understanding and thanked me for the call.     [name], [name] [address] Dept: [phone] Loc: [phone]  Tom Phelps  07/12/2018  You are scheduled for a Cardiac Catheterization on Tuesday, July 14 with Dr. Larae Grooms.  1. Please arrive at the [facility] (Main Entrance A) at [facility]: [address] [place], [place] [address] at 7:00 AM (This time is two hours before your procedure to ensure your preparation). Free valet parking service is available.   Special note: Every effort is made to have your procedure done on time. Please understand that emergencies sometimes delay scheduled procedures.  2. Diet: Do not eat solid foods after midnight.  The patient may have clear liquids until 5am upon the day of the procedure.  3. Labs: 07/16/18 at 2:45 PM. You do not need to be fasting.  Your Pre-procedure COVID-19 Testing will be done on 07/16/18 at 3:20 PM at [facility] at [address] [place], [place] [address] After your swab you will be given a mask to wear and instructed to go home and quarantine/no visitors until after your procedure. If you test positive you will be notified and your procedure will be cancelled.    4. Medication instructions in preparation for your procedure:   Contrast Allergy: No  DO NOT take your lisinopril-hydrochlorothiazide the morning of your procedure  On the morning of your procedure, take  a baby Aspirin 81 mg and your clopidogrel (plavix) and any morning medicines NOT listed above.  You may use sips of water.  5. Plan for one night stay--bring personal belongings. 6. Bring a current list of your medications and current insurance cards. 7. You MUST have a responsible person to drive you home. 8. Someone MUST be with you the first 24 hours after you arrive home or your discharge will be delayed. 9. Please wear clothes that are easy to get on and off and wear slip-on shoes.  Thank you for allowing [place] to care for you!   -- [name]

## 2018-07-12 NOTE — Telephone Encounter (Signed)
New message   Patient states that he is returning call from Dr. Hassell Done office. Please call.

## 2018-07-16 ENCOUNTER — Other Ambulatory Visit: Payer: Self-pay

## 2018-07-16 ENCOUNTER — Other Ambulatory Visit (HOSPITAL_COMMUNITY)
Admission: RE | Admit: 2018-07-16 | Discharge: 2018-07-16 | Disposition: A | Discharge status: Home / Self Care | Payer: Medicare HMO | Source: Ambulatory Visit | Attending: Interventional Cardiology | Admitting: Interventional Cardiology

## 2018-07-16 ENCOUNTER — Other Ambulatory Visit: Payer: Medicare HMO | Admitting: *Deleted

## 2018-07-16 DIAGNOSIS — R072 Precordial pain: Secondary | ICD-10-CM

## 2018-07-16 DIAGNOSIS — Z01812 Encounter for preprocedural laboratory examination: Secondary | ICD-10-CM | POA: Diagnosis not present

## 2018-07-16 DIAGNOSIS — Z1159 Encounter for screening for other viral diseases: Secondary | ICD-10-CM | POA: Diagnosis not present

## 2018-07-17 LAB — BASIC METABOLIC PANEL
BUN/Creatinine Ratio: 14 (ref 10–24)
BUN: 15 mg/dL (ref 8–27)
CO2: 20 mmol/L (ref 20–29)
Calcium: 9.1 mg/dL (ref 8.6–10.2)
Chloride: 102 mmol/L (ref 96–106)
Creatinine, Ser: 1.07 mg/dL (ref 0.76–1.27)
GFR calc Af Amer: 87 mL/min/{1.73_m2} (ref >59)
GFR calc non Af Amer: 75 mL/min/{1.73_m2} (ref >59)
Glucose: 122 mg/dL — ABNORMAL HIGH (ref 65–99)
Potassium: 4.3 mmol/L (ref 3.5–5.2)
Sodium: 137 mmol/L (ref 134–144)

## 2018-07-17 LAB — CBC
Hematocrit: 39.8 % (ref 37.5–51.0)
Hemoglobin: 13.9 g/dL (ref 13.0–17.7)
MCH: 30.8 pg (ref 26.6–33.0)
MCHC: 34.9 g/dL (ref 31.5–35.7)
MCV: 88 fL (ref 79–97)
Platelets: 240 10*3/uL (ref 150–450)
RBC: 4.51 x10E6/uL (ref 4.14–5.80)
RDW: 13.2 % (ref 11.6–15.4)
WBC: 6.4 10*3/uL (ref 3.4–10.8)

## 2018-07-17 LAB — SARS CORONAVIRUS 2 (TAT 6-24 HRS): SARS Coronavirus 2: NEGATIVE

## 2018-07-19 ENCOUNTER — Telehealth: Payer: Self-pay | Admitting: *Deleted

## 2018-07-19 NOTE — Telephone Encounter (Signed)
Pt contacted pre-catheterization scheduled at [facility] for: Tuesday July 20, 2018 9 AM Verified arrival time and place: [place] Entrance A at: 7 AM  Covid-19 test date: 07/16/18  No solid food after midnight prior to cath, clear liquids until 5 AM day of procedure. Contrast allergy: no   Except hold medications AM meds can be  taken pre-cath with sip of water including: ASA 81 mg Clopidogrel 75 mg  Confirmed patient has responsible person to drive home post procedure and observe 24 hours after arriving home: yes  At this time, due to Coivd-19 pandemic, pt aware no visitors allowed in hospital. Their designated party will be called after procedure with an update.  Patient aware [facility] requires all patients to wear a mask when they enter the hospital.      COVID-19 Pre-Screening Questions:  . In the past 7 to 10 days have you had a cough,  shortness of breath, headache, congestion, fever (100 or greater) body aches, chills, sore throat, or sudden loss of taste or sense of smell? no . Have you been around anyone with known Covid 19? no . Have you been around anyone who is awaiting Covid 19 test results in the past 7 to 10 days? no . Have you been around anyone who has been exposed to Covid 19, or has mentioned symptoms of Covid 19 within the past 7 to 10 days? no  I reviewed procedure instructions/mask/visior/Covid-19 screening questions with patient, he verbalized understanding, thanked me for call.

## 2018-07-20 ENCOUNTER — Other Ambulatory Visit: Payer: Self-pay

## 2018-07-20 ENCOUNTER — Ambulatory Visit (HOSPITAL_COMMUNITY)
Admission: RE | Admit: 2018-07-20 | Discharge: 2018-07-20 | Disposition: A | Discharge status: Home / Self Care | Payer: Medicare HMO | Attending: Interventional Cardiology | Admitting: Interventional Cardiology

## 2018-07-20 ENCOUNTER — Telehealth: Payer: Medicare HMO | Admitting: Interventional Cardiology

## 2018-07-20 ENCOUNTER — Encounter (HOSPITAL_COMMUNITY)
Admission: RE | Disposition: A | Discharge status: Home / Self Care | Payer: Self-pay | Source: Home / Self Care | Attending: Interventional Cardiology

## 2018-07-20 DIAGNOSIS — F419 Anxiety disorder, unspecified: Secondary | ICD-10-CM | POA: Diagnosis not present

## 2018-07-20 DIAGNOSIS — I700 Atherosclerosis of aorta: Secondary | ICD-10-CM | POA: Insufficient documentation

## 2018-07-20 DIAGNOSIS — Z7902 Long term (current) use of antithrombotics/antiplatelets: Secondary | ICD-10-CM | POA: Diagnosis not present

## 2018-07-20 DIAGNOSIS — E7800 Pure hypercholesterolemia, unspecified: Secondary | ICD-10-CM | POA: Insufficient documentation

## 2018-07-20 DIAGNOSIS — Z885 Allergy status to narcotic agent status: Secondary | ICD-10-CM | POA: Diagnosis not present

## 2018-07-20 DIAGNOSIS — Z8673 Personal history of transient ischemic attack (TIA), and cerebral infarction without residual deficits: Secondary | ICD-10-CM | POA: Insufficient documentation

## 2018-07-20 DIAGNOSIS — I10 Essential (primary) hypertension: Secondary | ICD-10-CM | POA: Insufficient documentation

## 2018-07-20 DIAGNOSIS — I25119 Atherosclerotic heart disease of native coronary artery with unspecified angina pectoris: Secondary | ICD-10-CM | POA: Insufficient documentation

## 2018-07-20 DIAGNOSIS — Z79899 Other long term (current) drug therapy: Secondary | ICD-10-CM | POA: Diagnosis not present

## 2018-07-20 DIAGNOSIS — I209 Angina pectoris, unspecified: Secondary | ICD-10-CM

## 2018-07-20 DIAGNOSIS — E119 Type 2 diabetes mellitus without complications: Secondary | ICD-10-CM | POA: Diagnosis not present

## 2018-07-20 DIAGNOSIS — R69 Illness, unspecified: Secondary | ICD-10-CM | POA: Diagnosis not present

## 2018-07-20 HISTORY — PX: LEFT HEART CATH AND CORONARY ANGIOGRAPHY: CATH118249

## 2018-07-20 HISTORY — PX: INTRAVASCULAR PRESSURE WIRE/FFR STUDY: CATH118243

## 2018-07-20 LAB — GLUCOSE, CAPILLARY
Glucose-Capillary: 106 mg/dL — ABNORMAL HIGH (ref 70–99)
Glucose-Capillary: 110 mg/dL — ABNORMAL HIGH (ref 70–99)

## 2018-07-20 LAB — POCT ACTIVATED CLOTTING TIME: Activated Clotting Time: 290 seconds

## 2018-07-20 SURGERY — LEFT HEART CATH AND CORONARY ANGIOGRAPHY
Anesthesia: LOCAL

## 2018-07-20 MED ORDER — CLOPIDOGREL BISULFATE 75 MG PO TABS
75.0000 mg | ORAL_TABLET | ORAL | Status: AC
  Administered 2018-07-20: 75 mg via ORAL
  Filled 2018-07-20: qty 1

## 2018-07-20 MED ORDER — SODIUM CHLORIDE 0.9 % IV SOLN: 250.0000 mL | INTRAVENOUS | Status: DC | PRN

## 2018-07-20 MED ORDER — SODIUM CHLORIDE 0.9% FLUSH: 3.0000 mL | INTRAVENOUS | Status: DC | PRN

## 2018-07-20 MED ORDER — LIDOCAINE HCL (PF) 1 % IJ SOLN
INTRAMUSCULAR | Status: AC
  Filled 2018-07-20: qty 30

## 2018-07-20 MED ORDER — SODIUM CHLORIDE 0.9% FLUSH: 3.0000 mL | Freq: Two times a day (BID) | INTRAVENOUS | Status: DC

## 2018-07-20 MED ORDER — ATORVASTATIN CALCIUM 80 MG PO TABS: 80.0000 mg | ORAL_TABLET | Freq: Every day | ORAL | 11 refills | Status: DC

## 2018-07-20 MED ORDER — HYDRALAZINE HCL 20 MG/ML IJ SOLN: 10.0000 mg | INTRAMUSCULAR | Status: DC | PRN

## 2018-07-20 MED ORDER — MIDAZOLAM HCL 2 MG/2ML IJ SOLN
INTRAMUSCULAR | Status: AC
  Filled 2018-07-20: qty 2

## 2018-07-20 MED ORDER — MIDAZOLAM HCL 2 MG/2ML IJ SOLN
INTRAMUSCULAR | Status: DC | PRN
  Administered 2018-07-20: 2 mg via INTRAVENOUS
  Administered 2018-07-20: 1 mg via INTRAVENOUS

## 2018-07-20 MED ORDER — HEPARIN SODIUM (PORCINE) 1000 UNIT/ML IJ SOLN
INTRAMUSCULAR | Status: AC
  Filled 2018-07-20: qty 1

## 2018-07-20 MED ORDER — SODIUM CHLORIDE 0.9 % WEIGHT BASED INFUSION: 1.0000 mL/kg/h | INTRAVENOUS | Status: DC

## 2018-07-20 MED ORDER — ASPIRIN 81 MG PO CHEW: 81.0000 mg | CHEWABLE_TABLET | ORAL | Status: DC

## 2018-07-20 MED ORDER — LABETALOL HCL 5 MG/ML IV SOLN: 10.0000 mg | INTRAVENOUS | Status: DC | PRN

## 2018-07-20 MED ORDER — HEPARIN SODIUM (PORCINE) 1000 UNIT/ML IJ SOLN
INTRAMUSCULAR | Status: DC | PRN
  Administered 2018-07-20: 9000 [IU] via INTRAVENOUS

## 2018-07-20 MED ORDER — LIDOCAINE HCL (PF) 1 % IJ SOLN
INTRAMUSCULAR | Status: DC | PRN
  Administered 2018-07-20: 2 mL

## 2018-07-20 MED ORDER — SODIUM CHLORIDE 0.9 % WEIGHT BASED INFUSION
3.0000 mL/kg/h | INTRAVENOUS | Status: AC
  Administered 2018-07-20: 3 mL/kg/h via INTRAVENOUS

## 2018-07-20 MED ORDER — VERAPAMIL HCL 2.5 MG/ML IV SOLN
INTRAVENOUS | Status: AC
  Filled 2018-07-20: qty 2

## 2018-07-20 MED ORDER — HEPARIN (PORCINE) IN NACL 1000-0.9 UT/500ML-% IV SOLN
INTRAVENOUS | Status: DC | PRN
  Administered 2018-07-20 (×2): 500 mL

## 2018-07-20 MED ORDER — FENTANYL CITRATE (PF) 100 MCG/2ML IJ SOLN
INTRAMUSCULAR | Status: AC
  Filled 2018-07-20: qty 2

## 2018-07-20 MED ORDER — ACETAMINOPHEN 325 MG PO TABS: 650.0000 mg | ORAL_TABLET | ORAL | Status: DC | PRN

## 2018-07-20 MED ORDER — IOHEXOL 350 MG/ML SOLN
INTRAVENOUS | Status: DC | PRN
  Administered 2018-07-20: 95 mL via INTRAVENOUS

## 2018-07-20 MED ORDER — FENTANYL CITRATE (PF) 100 MCG/2ML IJ SOLN
INTRAMUSCULAR | Status: DC | PRN
  Administered 2018-07-20 (×2): 25 ug via INTRAVENOUS

## 2018-07-20 MED ORDER — ONDANSETRON HCL 4 MG/2ML IJ SOLN: 4.0000 mg | Freq: Four times a day (QID) | INTRAMUSCULAR | Status: DC | PRN

## 2018-07-20 MED ORDER — HEPARIN (PORCINE) IN NACL 1000-0.9 UT/500ML-% IV SOLN
INTRAVENOUS | Status: AC
  Filled 2018-07-20: qty 1000

## 2018-07-20 MED ORDER — VERAPAMIL HCL 2.5 MG/ML IV SOLN
INTRAVENOUS | Status: DC | PRN
  Administered 2018-07-20: 10 mL via INTRA_ARTERIAL

## 2018-07-20 MED ORDER — SODIUM CHLORIDE 0.9 % IV SOLN: INTRAVENOUS | Status: DC

## 2018-07-20 SURGICAL SUPPLY — 18 items
BAG SNAP BAND KOVER 36X36 (MISCELLANEOUS) ×2 IMPLANT
BAND ZEPHYR COMPRESS 30 LONG (HEMOSTASIS) ×2 IMPLANT
CATH 5FR JL3.5 JR4 ANG PIG MP (CATHETERS) ×2 IMPLANT
CATH LAUNCHER 5F EBU3.0 (CATHETERS) IMPLANT
CATH LAUNCHER 5F JR4 (CATHETERS) ×1 IMPLANT
CATHETER LAUNCHER 5F EBU3.0 (CATHETERS) ×2
COVER DOME SNAP 22 D (MISCELLANEOUS) ×2 IMPLANT
GUIDEWIRE INQWIRE 1.5J.035X260 (WIRE) ×1 IMPLANT
GUIDEWIRE PRESSURE COMET II (WIRE) ×1 IMPLANT
INQWIRE 1.5J .035X260CM (WIRE) ×2
KIT HEART LEFT (KITS) ×2 IMPLANT
KIT HEMO VALVE WATCHDOG (MISCELLANEOUS) ×1 IMPLANT
PACK CARDIAC CATHETERIZATION (CUSTOM PROCEDURE TRAY) ×2 IMPLANT
SHEATH PROBE COVER 6X72 (BAG) ×2 IMPLANT
SHEATH RAIN RADIAL 21G 6FR (SHEATH) ×2 IMPLANT
TRANSDUCER W/STOPCOCK (MISCELLANEOUS) ×2 IMPLANT
TUBING CIL FLEX 10 FLL-RA (TUBING) ×2 IMPLANT
WIRE HI TORQ VERSACORE-J 145CM (WIRE) ×2 IMPLANT

## 2018-07-20 NOTE — Progress Notes (Signed)
Reviewed d/c instructions with pt and his wife (via telephone) voices understanding.

## 2018-07-20 NOTE — Discharge Instructions (Signed)
Drink plenty of fluids °Keep right arm at or above heart level  °Radial Site Care ° °This sheet gives you information about how to care for yourself after your procedure. Your health care provider may also give you more specific instructions. If you have problems or questions, contact your health care provider. °What can I expect after the procedure? °After the procedure, it is common to have: °· Bruising and tenderness at the catheter insertion area. °Follow these instructions at home: °Medicines °· Take over-the-counter and prescription medicines only as told by your health care provider. °Insertion site care °· Follow instructions from your health care provider about how to take care of your insertion site. Make sure you: °? Wash your hands with soap and water before you change your bandage (dressing). If soap and water are not available, use hand sanitizer. °? Change your dressing as told by your health care provider. °? Leave stitches (sutures), skin glue, or adhesive strips in place. These skin closures may need to stay in place for 2 weeks or longer. If adhesive strip edges start to loosen and curl up, you may trim the loose edges. Do not remove adhesive strips completely unless your health care provider tells you to do that. °· Check your insertion site every day for signs of infection. Check for: °? Redness, swelling, or pain. °? Fluid or blood. °? Pus or a bad smell. °? Warmth. °· Do not take baths, swim, or use a hot tub until your health care provider approves. °· You may shower 24-48 hours after the procedure, or as directed by your health care provider. °? Remove the dressing and gently wash the site with plain soap and water. °? Pat the area dry with a clean towel. °? Do not rub the site. That could cause bleeding. °· Do not apply powder or lotion to the site. °Activity ° °· For 24 hours after the procedure, or as directed by your health care provider: °? Do not flex or bend the affected arm. °? Do  not push or pull heavy objects with the affected arm. °? Do not drive yourself home from the hospital or clinic. You may drive 24 hours after the procedure unless your health care provider tells you not to. °? Do not operate machinery or power tools. °· Do not lift anything that is heavier than 10 lb (4.5 kg), or the limit that you are told, until your health care provider says that it is safe. °· Ask your health care provider when it is okay to: °? Return to work or school. °? Resume usual physical activities or sports. °? Resume sexual activity. °General instructions °· If the catheter site starts to bleed, raise your arm and put firm pressure on the site. If the bleeding does not stop, get help right away. This is a medical emergency. °· If you went home on the same day as your procedure, a responsible adult should be with you for the first 24 hours after you arrive home. °· Keep all follow-up visits as told by your health care provider. This is important. °Contact a health care provider if: °· You have a fever. °· You have redness, swelling, or yellow drainage around your insertion site. °Get help right away if: °· You have unusual pain at the radial site. °· The catheter insertion area swells very fast. °· The insertion area is bleeding, and the bleeding does not stop when you hold steady pressure on the area. °· Your arm or hand   becomes pale, cool, tingly, or numb. °These symptoms may represent a serious problem that is an emergency. Do not wait to see if the symptoms will go away. Get medical help right away. Call your local emergency services (911 in the [place] Do not drive yourself to the hospital. °Summary °· After the procedure, it is common to have bruising and tenderness at the site. °· Follow instructions from your health care provider about how to take care of your radial site wound. Check the wound every day for signs of infection. °· Do not lift anything that is heavier than 10 lb (4.5 kg), or the  limit that you are told, until your health care provider says that it is safe. °This information is not intended to replace advice given to you by your health care provider. Make sure you discuss any questions you have with your health care provider. °Document Released: 01/25/2010 Document Revised: 01/28/2017 Document Reviewed: 01/28/2017 °Elsevier Patient Education © 2020 [employer] ° °

## 2018-07-20 NOTE — Interval H&P Note (Signed)
Cath Lab Visit (complete for each Cath Lab visit)  Clinical Evaluation Leading to the Procedure:   ACS: No.  Non-ACS:    Anginal Classification: CCS III  Anti-ischemic medical therapy: Minimal Therapy (1 class of medications)  Non-Invasive Test Results: High-risk stress test findings: cardiac mortality >3%/year  Prior CABG: No previous CABG   Abnormal CT   History and Physical Interval Note:  07/20/2018 9:33 AM  Tom Phelps  has presented today for surgery, with the diagnosis of Chest pain.  The various methods of treatment have been discussed with the patient and family. After consideration of risks, benefits and other options for treatment, the patient has consented to  Procedure(s): LEFT HEART CATH AND CORONARY ANGIOGRAPHY (N/A) as a surgical intervention.  [name] history has been reviewed, patient examined, no change in status, stable for surgery.  I have reviewed the patient's chart and labs.  Questions were answered to the patient's satisfaction.     Larae Grooms

## 2018-07-20 NOTE — H&P (Signed)
Cardiology Admission History and Physical:   Patient ID: Tom Phelps MRN: [id number]; DOB: Jan 29, 1960   Admission date: 07/20/2018  Primary Care Provider: Mila PalmerWolters, Sharon, MD Primary Cardiologist: No primary care provider on file. [place] Primary Electrophysiologist:  None   Chief Complaint:  CAD  Patient Profile:   Tom Phelps is a 58 y.o. male with abnormal coronary CT.  History of Present Illness:   [name] had stable angina.  CT showed severe LAD disease. Plan for cath.  Heart Pathway Score:     Past Medical History:  Diagnosis Date  . Actinic keratoses   . Ankle dislocation    calcaneal tendons  . Anxiety   . Aortic atherosclerosis (HCC)   . Chronic pain of left knee 11/18/2017  . Coronary artery disease   . DDD (degenerative disc disease), cervical   . Diabetes mellitus without complication (HCC)    2019  . Displaced other extraarticular fracture of right calcaneus, initial encounter for closed fracture 07/13/2017  . DOE (dyspnea on exertion)   . Fatty liver    mild  . Fracture of distal end of right fibula   . Hypercholesteremia   . Hypercholesterolemia   . Hypertension   . Lightheadedness   . Neck pain   . Prediabetes   . Pulmonary nodules   . Stress reaction   . TIA (transient ischemic attack)    circulation     No past surgical history on file.   Medications Prior to Admission: Prior to Admission medications   Medication Sig Start Date End Date Taking? Authorizing Provider  clopidogrel (PLAVIX) 75 MG tablet Take 75 mg by mouth daily. 03/30/17  Yes [provider]  metoprolol tartrate (LOPRESSOR) 25 MG tablet Take 1 tablet (25 mg total) by mouth 2 (two) times daily. 07/08/18  Yes Corky CraftsVaranasi, Perez Dirico S, MD  nitroGLYCERIN (NITROSTAT) 0.4 MG SL tablet Place 1 tablet (0.4 mg total) under the tongue every 5 (five) minutes as needed for chest pain. 07/08/18  Yes Corky CraftsVaranasi, Trentyn Boisclair S, MD  simvastatin (ZOCOR) 40 MG tablet Take 40 mg by mouth daily. 03/31/17  Yes  [provider]     Allergies:    Allergies  Allergen Reactions  . Metformin And Related Shortness Of Breath and Other (See Comments)    Pressure on heart  . Oxycodone Itching    Social History:   Social History   Socioeconomic History  . Marital status: Married    Spouse name: Not on file  . Number of children: Not on file  . Years of education: Not on file  . Highest education level: Not on file  Occupational History  . Not on file  Social Needs  . Financial resource strain: Not on file  . Food insecurity    Worry: Not on file    Inability: Not on file  . Transportation needs    Medical: Not on file    Non-medical: Not on file  Tobacco Use  . Smoking status: Never Smoker  . Smokeless tobacco: Never Used  Substance and Sexual Activity  . Alcohol use: Never    Frequency: Never  . Drug use: Never  . Sexual activity: Yes    Comment: married ([place]ar)  Lifestyle  . Physical activity    Days per week: Not on file    Minutes per session: Not on file  . Stress: Not on file  Relationships  . Social Musicianconnections    Talks on phone: Not on file    Gets together:  Not on file    Attends religious service: Not on file    Active member of club or organization: Not on file    Attends meetings of clubs or organizations: Not on file    Relationship status: Not on file  . Intimate partner violence    Fear of current or ex partner: Not on file    Emotionally abused: Not on file    Physically abused: Not on file    Forced sexual activity: Not on file  Other Topics Concern  . Not on file  Social History Narrative  . Not on file    Family History:   [name] family history includes CAD in his father and mother.    ROS:  Please see the history of present illness.  All other ROS reviewed and negative.     Physical Exam/Data:   Vitals:   07/20/18 0729 07/20/18 0737  BP:  126/89  Pulse:  (!) 48  Resp:  20  Temp:  (!) 97.5 F (36.4 C)  TempSrc:  Oral   SpO2:  97%  Weight: 86.2 kg   Height: 5\' 10"  (1.778 m)    No intake or output data in the 24 hours ending 07/20/18 0848 Last 3 Weights 07/20/2018 06/07/2018 02/17/2018  Weight (lbs) 190 lb 190 lb 202 lb  Weight (kg) 86.183 kg 86.183 kg 91.627 kg     Body mass index is 27.26 kg/m.  General:  Well nourished, well developed, in no acute distress HEENT: normal Lymph: no adenopathy Neck: no JVD Endocrine:  No thryomegaly Vascular: No carotid bruits; FA pulses 2+ bilaterally without bruits  Cardiac:  normal S1, S2; RRR; no murmur  Lungs:  clear to auscultation bilaterally, no wheezing, rhonchi or rales  Abd: soft, nontender, no hepatomegaly  Ext: no edema Musculoskeletal:  No deformities, BUE and BLE strength normal and equal Skin: warm and dry  Neuro:  CNs 2-12 intact, no focal abnormalities noted Psych:  Normal affect     Laboratory Data:  High Sensitivity Troponin:  No results for input(s): TROPONINIHS in the last 720 hours.    Cardiac EnzymesNo results for input(s): TROPONINI in the last 168 hours. No results for input(s): TROPIPOC in the last 168 hours.  Chemistry Recent Labs  Lab 07/16/18 1454  NA 137  K 4.3  CL 102  CO2 20  GLUCOSE 122*  BUN 15  CREATININE 1.07  CALCIUM 9.1  GFRNONAA 75  GFRAA 87    No results for input(s): PROT, ALBUMIN, AST, ALT, ALKPHOS, BILITOT in the last 168 hours. Hematology Recent Labs  Lab 07/16/18 1454  WBC 6.4  RBC 4.51  HGB 13.9  HCT 39.8  MCV 88  MCH 30.8  MCHC 34.9  RDW 13.2  PLT 240   BNPNo results for input(s): BNP, PROBNP in the last 168 hours.  DDimer No results for input(s): DDIMER in the last 168 hours.   Radiology/Studies:  No results found.  Assessment and Plan:   1. CAD: Plan for cath today.   Cardiac catheterization was discussed with the patient fully. The patient understands that risks include but are not limited to stroke (1 in 1000), death (1 in 1000), kidney failure [usually temporary] (1 in 500),  bleeding (1 in 200), allergic reaction [possibly serious] (1 in 200).  The patient understands and is willing to proceed.      For questions or updates, please contact [facility] Please consult [email or web address] for contact info under  SignedLarae Grooms, MD  07/20/2018 8:48 AM

## 2018-07-20 NOTE — Progress Notes (Signed)
Swelling (Hematoma)  noted to band site pressure held to area, swelling resloved.

## 2018-07-21 ENCOUNTER — Telehealth: Payer: Self-pay

## 2018-07-21 ENCOUNTER — Encounter (HOSPITAL_COMMUNITY): Payer: Self-pay | Admitting: Interventional Cardiology

## 2018-07-21 NOTE — Telephone Encounter (Signed)

## 2018-07-23 ENCOUNTER — Encounter: Payer: Self-pay | Admitting: Thoracic Surgery (Cardiothoracic Vascular Surgery)

## 2018-07-23 ENCOUNTER — Institutional Professional Consult (permissible substitution): Payer: Medicare HMO | Admitting: Thoracic Surgery (Cardiothoracic Vascular Surgery)

## 2018-07-23 ENCOUNTER — Other Ambulatory Visit: Payer: Self-pay | Admitting: *Deleted

## 2018-07-23 ENCOUNTER — Other Ambulatory Visit: Payer: Self-pay

## 2018-07-23 VITALS — BP 143/80 | HR 52 | Temp 97.7°F | Resp 18 | Ht 70.0 in | Wt 186.0 lb

## 2018-07-23 DIAGNOSIS — I25119 Atherosclerotic heart disease of native coronary artery with unspecified angina pectoris: Secondary | ICD-10-CM | POA: Diagnosis not present

## 2018-07-23 DIAGNOSIS — I2510 Atherosclerotic heart disease of native coronary artery without angina pectoris: Secondary | ICD-10-CM

## 2018-07-23 NOTE — Progress Notes (Signed)
[address]       [place] [address]             [phone]                    Tom Phelps [place] Medical Record #[id number] Date of Birth: 08/15/1960  Referring: Jettie Booze, MD Primary Care: Jonathon Jordan, MD Primary Cardiologist: No primary care provider on file.  Chief Complaint:   No chief complaint on file.   History of Present Illness:    Tom Phelps 58 y.o. male is seen in the office today for surgical evaluation of 2V CAD.  He has been followed with coronary CT scans, and has had worsening exertional angina over the past several months.  He underwent a LHC on 7/14 which revealed 2 vessel disease.  He denies any orthopnea, PND, or peripheral edema.  He does have anginal symptoms now at short distances.   Stopped plavix on 7/14  Current Activity/ Functional Status:  Patient is independent with mobility/ambulation, transfers, ADL's, IADL's.   Zubrod Score: At the time of surgery this patients most appropriate activity status/level should be described as: [x]     0    Normal activity, no symptoms []     1    Restricted in physical strenuous activity but ambulatory, able to do out light work []     2    Ambulatory and capable of self care, unable to do work activities, up and about               >50 % of waking hours                              []     3    Only limited self care, in bed greater than 50% of waking hours []     4    Completely disabled, no self care, confined to bed or chair []     5    Moribund   Past Medical History:  Diagnosis Date   Actinic keratoses    Ankle dislocation    calcaneal tendons   Anxiety    Aortic atherosclerosis (HCC)    Chronic pain of left knee 11/18/2017   Coronary artery disease    DDD (degenerative disc disease), cervical    Diabetes mellitus without complication ([place])    1914   Displaced other extraarticular fracture of right calcaneus, initial encounter for closed fracture 07/13/2017    DOE (dyspnea on exertion)    Fatty liver    mild   Fracture of distal end of right fibula    Hypercholesteremia    Hypercholesterolemia    Hypertension    Lightheadedness    Neck pain    Prediabetes    Pulmonary nodules    Stress reaction    TIA (transient ischemic attack)    circulation     Past Surgical History:  Procedure Laterality Date   INTRAVASCULAR PRESSURE WIRE/FFR STUDY N/A 07/20/2018   Procedure: INTRAVASCULAR PRESSURE WIRE/FFR STUDY;  Surgeon: Jettie Booze, MD;  Location: [facility];  Service: Cardiovascular;  Laterality: N/A;   LEFT HEART CATH AND CORONARY ANGIOGRAPHY N/A 07/20/2018   Procedure: LEFT HEART CATH AND CORONARY ANGIOGRAPHY;  Surgeon: Jettie Booze, MD;  Location: [facility];  Service: Cardiovascular;  Laterality: N/A;    Family History  Problem Relation Age of Onset  CAD Mother    CAD Father      Social History   Tobacco Use  Smoking Status Never Smoker  Smokeless Tobacco Never Used    Social History   Substance and Sexual Activity  Alcohol Use Never   Frequency: Never     Allergies  Allergen Reactions   Metformin And Related Shortness Of Breath and Other (See Comments)    Pressure on heart   Oxycodone Itching    Current Outpatient Medications  Medication Sig Dispense Refill   atorvastatin (LIPITOR) 80 MG tablet Take 1 tablet (80 mg total) by mouth daily. 30 tablet 11   clopidogrel (PLAVIX) 75 MG tablet Take 75 mg by mouth daily.  12   metoprolol tartrate (LOPRESSOR) 25 MG tablet Take 1 tablet (25 mg total) by mouth 2 (two) times daily. 180 tablet 1   nitroGLYCERIN (NITROSTAT) 0.4 MG SL tablet Place 1 tablet (0.4 mg total) under the tongue every 5 (five) minutes as needed for chest pain. 25 tablet 6   No current facility-administered medications for this visit.     Constitutional: negative Respiratory: positive for dyspnea on exertion Cardiovascular: positive for exertional  chest pressure/discomfort Gastrointestinal: negative Musculoskeletal:positive for stiff joints Neurological: negative   Review of Systems:     Cardiac Review of Systems: [Y] = yes  or   [ N ] = no   Chest Pain [    ]  Resting SOB [   ] Exertional SOB  [  ]  Orthopnea [  ]   Pedal Edema [   ]    Palpitations [  ] Syncope  [  ]   Presyncope [   ]   General Review of Systems: [Y] = yes [  ]=no Constitional: recent weight change [  ];  Wt loss over the last 3 months [   ] anorexia [  ]; fatigue [  ]; nausea [  ]; night sweats [  ]; fever [  ]; or chills [  ];           Eye : blurred vision [  ]; diplopia [   ]; vision changes [  ];  Amaurosis fugax[  ]; Resp: cough [  ];  wheezing[  ];  hemoptysis[  ]; shortness of breath[  ]; paroxysmal nocturnal dyspnea[  ]; dyspnea on exertion[  ]; or orthopnea[  ];  GI:  gallstones[  ], vomiting[  ];  dysphagia[  ]; melena[  ];  hematochezia [  ]; heartburn[  ];   Hx of  Colonoscopy[  ]; GU: kidney stones [  ]; hematuria[  ];   dysuria [  ];  nocturia[  ];  history of     obstruction [  ]; urinary frequency [  ]             Skin: rash, swelling[  ];, hair loss[  ];  peripheral edema[  ];  or itching[  ]; Musculosketetal: myalgias[  ];  joint swelling[  ];  joint erythema[  ];  joint pain[  ];  back pain[  ];  Heme/Lymph: bruising[  ];  bleeding[  ];  anemia[  ];  Neuro: TIA[  ];  headaches[  ];  stroke[  ];  vertigo[  ];  seizures[  ];   paresthesias[  ];  difficulty walking[  ];  Psych:depression[  ]; anxiety[  ];  Endocrine: diabetes[  ];  thyroid dysfunction[  ];  Immunizations: Flu up to date [  ];  Pneumococcal up to date [  ];  Other:     PHYSICAL EXAMINATION: There were no vitals taken for this visit. General appearance: alert and no distress Neck: no adenopathy, no carotid bruit, supple, symmetrical, trachea midline and thyroid not enlarged, symmetric, no tenderness/mass/nodules Resp: clear to auscultation bilaterally Cardio: bradycardic, no  murmur GI: normal findings: soft, non-tender Extremities: extremities normal, atraumatic, no cyanosis or edema Neurologic: Grossly normal  Diagnostic Studies & Laboratory data:     Recent Radiology Findings:   Ct Coronary Morph W/cta Cor W/score W/ca W/cm &/or Wo/cm  Addendum Date: 07/06/2018   ADDENDUM REPORT: 07/06/2018 16:27 ADDENDUM: Ramus intermedius branch is present, with severe proximal mixed atherosclerotic plaque, 70-99% stenosis. Mild scattered disease in remainder of vessel. Electronically Signed   By: Weston BrassGayatri  Acharya   On: 07/06/2018 16:27   Addendum Date: 07/06/2018   ADDENDUM REPORT: 07/06/2018 16:23 HISTORY: chest pain EXAM: Cardiac/Coronary  CT TECHNIQUE: The patient was scanned on a [employer] Force scanner. PROTOCOL: A 120 kV prospective scan was triggered in the descending thoracic aorta at 111 HU's. Axial non-contrast 3 mm slices were carried out through the heart. The data set was analyzed on a dedicated work station and scored using the Agatson method. Gantry rotation speed was 250 msecs and collimation was .6 mm. Beta blockade and 0.8 mg of sl NTG was given. The 3D data set was reconstructed in 5% intervals of the 67-82 % of the R-R cycle. Diastolic phases were analyzed on a dedicated work station using MPR, MIP and VRT modes. The patient received 100mL OMNIPAQUE IOHEXOL 350 MG/ML SOLN of contrast. FINDINGS: Coronary calcium score: [name] coronary artery calcium score is 937, which places the patient in the 96th percentile. Coronary arteries: Normal coronary origins.  Right dominance. Right Coronary Artery: Severe proximal RCA mixed atherosclerotic plaque, 70-99% stenosis. Severe mid RCA with atherosclerotic plaque 70-99% stenosis, with features of positive remodeling (vulnerable plaque characteristic). Moderate distal RCA plaque, 50-69% stenosis. Patent PDA and PL, small caliber, with some proximal disease in PL. Left Main Coronary Artery: Moderate distal left main stenosis,  50-69% stenosis with mixed atherosclerotic plaque and near circumferential calcification. Left Anterior Descending Coronary Artery: Severe mixed atherosclerotic plaque in proximal LAD, 70-99% stenosis. Severe mixed atherosclerotic plaque in the mid LAD, 70-99% stenosis. Severe mixed atherosclerotic plaque in the distal LAD, 70-99% stenosis. Patent small diagonals. Left Circumflex Artery: Mild mixed atherosclerotic plaque in the proximal circumflex. Likely severe mixed atherosclerotic plaque diffusely in the distal circumflex artery, which is a small caliber vessel. Patent small OM. Aorta: Normal size, 34 mm at the mid ascending aorta (level of the PA bifurcation) measured double oblique. No calcifications. No dissection. Aortic Valve: No calcifications. Other findings: Normal pulmonary vein drainage into the left atrium. Normal left atrial appendage without a thrombus. Normal size of the pulmonary artery. IMPRESSION: 1. Severe CAD, CADRADS = 4. Severe lesions in the LAD, and likely >50% lesions in distal left main coronary artery. Severe proximal RCA lesion. Consider coronary angiography. CT FFR will be performed and reported separately. 2. [name] coronary artery calcium score is 937, which places the patient in the 96th percentile for age and sex matched control. 3. Normal coronary origin with right dominance. Electronically Signed   By: Weston BrassGayatri  Acharya   On: 07/06/2018 16:23   Result Date: 07/06/2018 EXAM: OVER-READ INTERPRETATION  CT CHEST The following report is an over-read performed by radiologist Dr. Jeronimo [facility]Kyle Talbot [facility] on 07/06/2018. This over-read does not include  interpretation of cardiac or coronary anatomy or pathology. The coronary CTA interpretation by the cardiologist is attached. COMPARISON:  Chest CT 04/10/2017 FINDINGS: Vascular: Aortic atherosclerosis. No central pulmonary embolism, on this non-dedicated study. Mediastinum/Nodes: No imaged thoracic adenopathy.  Lungs/Pleura: No pleural fluid. Subpleural predominant pulmonary nodules on the order of 4 mm and less are similar, including on image 40/12. Upper Abdomen: Normal imaged portions of the liver, spleen, stomach. Musculoskeletal: No acute osseous abnormality. IMPRESSION: 1.  No acute findings in the imaged extracardiac chest. 2. Scattered subpleural pulmonary nodules are most likely subpleural lymph nodes. These were evaluated on 04/10/2017. Please see recommendations included there. Electronically Signed: By: Jeronimo GreavesKyle  Talbot M.D. On: 07/06/2018 08:47   Ct Coronary Fractional Flow Reserve Data Prep  Result Date: 07/14/2018 EXAM: CT FFR ANALYSIS CLINICAL DATA:  chest pain FINDINGS: FFRct analysis was performed on the original cardiac CT angiogram dataset. Diagrammatic representation of the FFRct analysis is provided in a separate PDF document in PACS. This dictation was created using the PDF document and an interactive 3D model of the results. 3D model is not available in the EMR/PACS. Normal FFR range is >0.80. 1. Left Main:  No significant stenosis. FFR = 0.95 2. LAD: Proximal FFR = 0.81, Mid FFR =0.59, Distal FFR = 0.56 3. Ramus intermedius: FFR = 0.85 4. LCX: No significant stenosis. Proximal FFR = 0.89, Distal FFR = not well mapped 5. RCA: No significant stenosis. Proximal FFR = 0.96, Mid FFR = 0.85, Distal FFR = 0.82 IMPRESSION: 1.  CT FFR analysis showed severe stenosis in the mid LAD. Consider coronary angiography to further evaluate chest pain. Electronically Signed   By: Weston BrassGayatri  Acharya   On: 07/07/2018 17:53   Ct Coronary Fractional Flow Reserve Fluid Analysis  Result Date: 07/14/2018 EXAM: CT FFR ANALYSIS CLINICAL DATA:  chest pain FINDINGS: FFRct analysis was performed on the original cardiac CT angiogram dataset. Diagrammatic representation of the FFRct analysis is provided in a separate PDF document in PACS. This dictation was created using the PDF document and an interactive 3D model of the results.  3D model is not available in the EMR/PACS. Normal FFR range is >0.80. 1. Left Main:  No significant stenosis. FFR = 0.95 2. LAD: Proximal FFR = 0.81, Mid FFR =0.59, Distal FFR = 0.56 3. Ramus intermedius: FFR = 0.85 4. LCX: No significant stenosis. Proximal FFR = 0.89, Distal FFR = not well mapped 5. RCA: No significant stenosis. Proximal FFR = 0.96, Mid FFR = 0.85, Distal FFR = 0.82 IMPRESSION: 1.  CT FFR analysis showed severe stenosis in the mid LAD. Consider coronary angiography to further evaluate chest pain. Electronically Signed   By: Weston BrassGayatri  Acharya   On: 07/07/2018 17:53     I have independently reviewed the above radiology studies  and reviewed the findings with the patient.   Recent Lab Findings: Lab Results  Component Value Date   WBC 6.4 07/16/2018   HGB 13.9 07/16/2018   HCT 39.8 07/16/2018   PLT 240 07/16/2018   GLUCOSE 122 (H) 07/16/2018   ALT 10 09/07/2008   AST 21 09/07/2008   NA 137 07/16/2018   K 4.3 07/16/2018   CL 102 07/16/2018   CREATININE 1.07 07/16/2018   BUN 15 07/16/2018   CO2 20 07/16/2018   INR 1.06 04/09/2017   LHC 07/20/18  Mid RCA lesion is 50% stenosed. Not significant by DFR: 0.99.  2nd Mrg lesion is 90% stenosed.  Dist LM lesion is 40% stenosed.  Mid  LAD-1 lesion is 75% stenosed.  Mid LAD-2 lesion is 75% stenosed.  Ost LAD to Prox LAD lesion is 70% stenosed.  Prox LAD to Mid LAD lesion is 50% stenosed.  The left ventricular systolic function is normal.  LV end diastolic pressure is normal.  The left ventricular ejection fraction is 55-65% by visual estimate.  There is no aortic valve stenosis.  Echo 06/21/18:  1. The left ventricle has low normal systolic function, with an ejection fraction of 50-55%. The cavity size was normal. There is mildly increased left ventricular wall thickness. Left ventricular diastolic parameters were normal.  2. The right ventricle has normal systolic function. The cavity was normal. There is no increase  in right ventricular wall thickness.  3. Mild thickening of the mitral valve leaflet.  4. The aortic valve is tricuspid. Mild thickening of the aortic valve. Mild calcification of the aortic valve.  Assessment / Plan:   [age] male with 2V CAD, without evidence of valvular disease.  Preserved LV and RV function.  Images reviewed.  He has a 40% LM lesion, and tandem lesion in the LAD.  The LAD is suitable for bypass.  There is also a OM2 ostial lesion, but the vessel is quite small.  I will evaluate this at the time of surgery, but if it is a small caliber artery, I will bypass another vessel on the lateral wall given his left main disease.       I  spent 40 minutes with  the patient face to face and greater then 50% of the time was spent in counseling and coordination of care.    Corliss SkainsHarrell O Jimmylee Ratterree 07/23/2018 12:39 PM

## 2018-07-26 ENCOUNTER — Other Ambulatory Visit (HOSPITAL_COMMUNITY)
Admission: RE | Admit: 2018-07-26 | Discharge: 2018-07-26 | Disposition: A | Discharge status: Home / Self Care | Payer: Medicare HMO | Source: Ambulatory Visit | Attending: Thoracic Surgery (Cardiothoracic Vascular Surgery) | Admitting: Thoracic Surgery (Cardiothoracic Vascular Surgery)

## 2018-07-26 DIAGNOSIS — Z1159 Encounter for screening for other viral diseases: Secondary | ICD-10-CM | POA: Insufficient documentation

## 2018-07-27 LAB — SARS CORONAVIRUS 2 (TAT 6-24 HRS): SARS Coronavirus 2: NEGATIVE

## 2018-07-27 NOTE — Pre-Procedure Instructions (Signed)
Tom Phelps  07/27/2018      [facility] [address] [place], [place] [address] Phone: [phone] Fax: [place], [address] [place] [address] Phone: [phone] Fax: [phone]    Your procedure is scheduled on July 29, 2018.  Report to [facility] Entrance "A" at 600 AM.  Call this number if you have problems the morning of surgery:  [phone]   Call [phone] if you have any questions prior to your surgery date Monday-Friday 8am-4pm    Remember:  Do not eat or drink after midnight.    Take these medicines the morning of surgery with A SIP OF WATER  Metoprolol (lopressor) Nitrostat-if needed for chest pain  Beginning now, STOP taking any Aspirin (unless otherwise instructed by your surgeon), Aleve, Naproxen, Ibuprofen, Motrin, Advil, Goody's, BC's, all herbal medications, fish oil, and all vitamins    How to Manage Your Diabetes Before and After Surgery  Why is it important to control my blood sugar before and after surgery? . Improving blood sugar levels before and after surgery helps healing and can limit problems. . A way of improving blood sugar control is eating a healthy diet by: o  Eating less sugar and carbohydrates o  Increasing activity/exercise o  Talking with your doctor about reaching your blood sugar goals . High blood sugars (greater than 180 mg/dL) can raise your risk of infections and slow your recovery, so you will need to focus on controlling your diabetes during the weeks before surgery. . Make sure that the doctor who takes care of your diabetes knows about your planned surgery including the date and location.  How do I manage my blood sugar before surgery? . Check your blood sugar at least 4 times a day, starting 2 days before surgery, to make sure that the level is not too high or  low. o Check your blood sugar the morning of your surgery when you wake up and every 2 hours until you get to the Short Stay unit. . If your blood sugar is less than 70 mg/dL, you will need to treat for low blood sugar: o Do not take insulin. o Treat a low blood sugar (less than 70 mg/dL) with  cup of clear juice (cranberry or apple), 4 glucose tablets, OR glucose gel. Recheck blood sugar in 15 minutes after treatment (to make sure it is greater than 70 mg/dL). If your blood sugar is not greater than 70 mg/dL on recheck, call [phone] o  for further instructions. . Report your blood sugar to the short stay nurse when you get to Short Stay.  . If you are admitted to the hospital after surgery: o Your blood sugar will be checked by the staff and you will probably be given insulin after surgery (instead of oral diabetes medicines) to make sure you have good blood sugar levels. o The goal for blood sugar control after surgery is 80-180 mg/dL.    Reviewed and Endorsed by [facility] Patient Education Committee, August 2015   [facility]- Preparing For Surgery  Before surgery, you can play an important role. Because skin is not sterile, your skin needs to be as free of germs as possible. You can reduce the number of germs on your skin by washing with CHG (chlorahexidine gluconate) Soap before surgery.  CHG is an antiseptic cleaner which kills  germs and bonds with the skin to continue killing germs even after washing.    Oral Hygiene is also important to reduce your risk of infection.  Remember - BRUSH YOUR TEETH THE MORNING OF SURGERY WITH YOUR REGULAR TOOTHPASTE  Please do not use if you have an allergy to CHG or antibacterial soaps. If your skin becomes reddened/irritated stop using the CHG.  Do not shave (including legs and underarms) for at least 48 hours prior to first CHG shower. It is OK to shave your face.  Please follow these instructions carefully.   1. Shower the NIGHT BEFORE  SURGERY and the MORNING OF SURGERY with CHG.   2. If you chose to wash your hair, wash your hair first as usual with your normal shampoo.  3. After you shampoo, rinse your hair and body thoroughly to remove the shampoo.  4. Use CHG as you would any other liquid soap. You can apply CHG directly to the skin and wash gently with a scrungie or a clean washcloth.   5. Apply the CHG Soap to your body ONLY FROM THE NECK DOWN.  Do not use on open wounds or open sores. Avoid contact with your eyes, ears, mouth and genitals (private parts). Wash Face and genitals (private parts)  with your normal soap.  6. Wash thoroughly, paying special attention to the area where your surgery will be performed.  7. Thoroughly rinse your body with warm water from the neck down.  8. DO NOT shower/wash with your normal soap after using and rinsing off the CHG Soap.  9. Pat yourself dry with a CLEAN TOWEL.  10. Wear CLEAN PAJAMAS to bed the night before surgery, wear comfortable clothes the morning of surgery  11. Place CLEAN SHEETS on your bed the night of your first shower and DO NOT SLEEP WITH PETS.  Day of Surgery:  Do not apply any deodorants/lotions.  Please wear clean clothes to the hospital/surgery center.   Remember to brush your teeth WITH YOUR REGULAR TOOTHPASTE.   Do not wear jewelry  Do not wear lotions, powders, or colognes, or deodorant.  Men may shave face and neck.  Do not bring valuables to the hospital.  [facility] is not responsible for any belongings or valuables.  Contacts, dentures or bridgework may not be worn into surgery.  Leave your suitcase in the car.  After surgery it may be brought to your room.  For patients admitted to the hospital, discharge time will be determined by your treatment team.  IF you are a smoker, DO NOT Smoke 24 hours prior to surgery   IF you wear a CPAP at night please bring your mask, tubing, and machine the morning of surgery    Remember that you must  have someone to transport you home after your surgery, and remain with you for 24 hours if you are discharged the same day.  Patients discharged the day of surgery will not be allowed to drive home.    Please read over the following fact sheets that you were given.

## 2018-07-28 ENCOUNTER — Encounter (HOSPITAL_COMMUNITY): Payer: Self-pay | Admitting: Anesthesiology

## 2018-07-28 ENCOUNTER — Encounter (HOSPITAL_COMMUNITY): Payer: Self-pay

## 2018-07-28 ENCOUNTER — Ambulatory Visit (HOSPITAL_BASED_OUTPATIENT_CLINIC_OR_DEPARTMENT_OTHER)
Admission: RE | Admit: 2018-07-28 | Discharge: 2018-07-28 | Disposition: A | Discharge status: Home / Self Care | Payer: Medicare HMO | Source: Ambulatory Visit | Attending: Thoracic Surgery (Cardiothoracic Vascular Surgery) | Admitting: Thoracic Surgery (Cardiothoracic Vascular Surgery)

## 2018-07-28 ENCOUNTER — Ambulatory Visit (HOSPITAL_COMMUNITY)
Admission: RE | Admit: 2018-07-28 | Discharge: 2018-07-28 | Disposition: A | Discharge status: Home / Self Care | Payer: Medicare HMO | Source: Ambulatory Visit | Attending: Thoracic Surgery (Cardiothoracic Vascular Surgery) | Admitting: Thoracic Surgery (Cardiothoracic Vascular Surgery)

## 2018-07-28 ENCOUNTER — Other Ambulatory Visit: Payer: Self-pay

## 2018-07-28 ENCOUNTER — Ambulatory Visit: Payer: Medicare HMO | Admitting: Interventional Cardiology

## 2018-07-28 ENCOUNTER — Inpatient Hospital Stay (HOSPITAL_COMMUNITY): Admission: RE | Admit: 2018-07-28 | Payer: Medicare HMO | Source: Ambulatory Visit

## 2018-07-28 ENCOUNTER — Encounter (HOSPITAL_COMMUNITY)
Admission: RE | Admit: 2018-07-28 | Discharge: 2018-07-28 | Disposition: A | Discharge status: Home / Self Care | Payer: Medicare HMO | Source: Ambulatory Visit | Attending: Thoracic Surgery (Cardiothoracic Vascular Surgery) | Admitting: Thoracic Surgery (Cardiothoracic Vascular Surgery)

## 2018-07-28 DIAGNOSIS — D62 Acute posthemorrhagic anemia: Secondary | ICD-10-CM | POA: Diagnosis not present

## 2018-07-28 DIAGNOSIS — R1310 Dysphagia, unspecified: Secondary | ICD-10-CM | POA: Diagnosis not present

## 2018-07-28 DIAGNOSIS — I700 Atherosclerosis of aorta: Secondary | ICD-10-CM | POA: Diagnosis not present

## 2018-07-28 DIAGNOSIS — I25119 Atherosclerotic heart disease of native coronary artery with unspecified angina pectoris: Secondary | ICD-10-CM | POA: Diagnosis not present

## 2018-07-28 DIAGNOSIS — E7800 Pure hypercholesterolemia, unspecified: Secondary | ICD-10-CM | POA: Diagnosis not present

## 2018-07-28 DIAGNOSIS — K760 Fatty (change of) liver, not elsewhere classified: Secondary | ICD-10-CM | POA: Diagnosis not present

## 2018-07-28 DIAGNOSIS — R110 Nausea: Secondary | ICD-10-CM | POA: Diagnosis not present

## 2018-07-28 DIAGNOSIS — I10 Essential (primary) hypertension: Secondary | ICD-10-CM | POA: Diagnosis not present

## 2018-07-28 DIAGNOSIS — I2510 Atherosclerotic heart disease of native coronary artery without angina pectoris: Secondary | ICD-10-CM

## 2018-07-28 DIAGNOSIS — Z01818 Encounter for other preprocedural examination: Secondary | ICD-10-CM | POA: Diagnosis not present

## 2018-07-28 DIAGNOSIS — E119 Type 2 diabetes mellitus without complications: Secondary | ICD-10-CM | POA: Diagnosis not present

## 2018-07-28 DIAGNOSIS — G8929 Other chronic pain: Secondary | ICD-10-CM | POA: Diagnosis not present

## 2018-07-28 HISTORY — DX: Cerebral infarction, unspecified: I63.9

## 2018-07-28 HISTORY — DX: Dyspnea, unspecified: R06.00

## 2018-07-28 LAB — BLOOD GAS, ARTERIAL
Acid-base deficit: 0.7 mmol/L (ref 0.0–2.0)
Bicarbonate: 22.9 mmol/L (ref 20.0–28.0)
Drawn by: 470591
FIO2: 21
O2 Saturation: 98.2 %
Patient temperature: 98.6
pCO2 arterial: 34.3 mmHg (ref 32.0–48.0)
pH, Arterial: 7.44 (ref 7.350–7.450)
pO2, Arterial: 105 mmHg (ref 83.0–108.0)

## 2018-07-28 LAB — PULMONARY FUNCTION TEST
FEF 25-75 Pre: 2.96 L/sec
FEF2575-%Pred-Pre: 99 %
FEV1-%Pred-Pre: 112 %
FEV1-Pre: 4.05 L
FEV1FVC-%Pred-Pre: 103 %
FEV6-%Pred-Pre: 110 %
FEV6-Pre: 5.03 L
FEV6FVC-%Pred-Pre: 102 %
FVC-%Pred-Pre: 108 %
FVC-Pre: 5.15 L
Pre FEV1/FVC ratio: 79 %
Pre FEV6/FVC Ratio: 98 %

## 2018-07-28 LAB — URINALYSIS, ROUTINE W REFLEX MICROSCOPIC
Bilirubin Urine: NEGATIVE
Glucose, UA: NEGATIVE mg/dL
Hgb urine dipstick: NEGATIVE
Ketones, ur: NEGATIVE mg/dL
Leukocytes,Ua: NEGATIVE
Nitrite: NEGATIVE
Protein, ur: NEGATIVE mg/dL
Specific Gravity, Urine: 1.021 (ref 1.005–1.030)
pH: 5 (ref 5.0–8.0)

## 2018-07-28 LAB — COMPREHENSIVE METABOLIC PANEL
ALT: 10 U/L (ref 0–44)
AST: 29 U/L (ref 15–41)
Albumin: 3.9 g/dL (ref 3.5–5.0)
Alkaline Phosphatase: 79 U/L (ref 38–126)
Anion gap: 12 (ref 5–15)
BUN: 14 mg/dL (ref 6–20)
CO2: 17 mmol/L — ABNORMAL LOW (ref 22–32)
Calcium: 9.1 mg/dL (ref 8.9–10.3)
Chloride: 108 mmol/L (ref 98–111)
Creatinine, Ser: 1.03 mg/dL (ref 0.61–1.24)
GFR calc Af Amer: >60 mL/min (ref >60)
GFR calc non Af Amer: >60 mL/min (ref >60)
Glucose, Bld: 175 mg/dL — ABNORMAL HIGH (ref 70–99)
Potassium: 5 mmol/L (ref 3.5–5.1)
Sodium: 137 mmol/L (ref 135–145)
Total Bilirubin: 0.9 mg/dL (ref 0.3–1.2)
Total Protein: 6.5 g/dL (ref 6.5–8.1)

## 2018-07-28 LAB — CBC
HCT: 42 % (ref 39.0–52.0)
Hemoglobin: 14.3 g/dL (ref 13.0–17.0)
MCH: 31.5 pg (ref 26.0–34.0)
MCHC: 34 g/dL (ref 30.0–36.0)
MCV: 92.5 fL (ref 80.0–100.0)
Platelets: 323 10*3/uL (ref 150–400)
RBC: 4.54 MIL/uL (ref 4.22–5.81)
RDW: 13.5 % (ref 11.5–15.5)
WBC: 6.6 10*3/uL (ref 4.0–10.5)
nRBC: 0.3 % — ABNORMAL HIGH (ref 0.0–0.2)

## 2018-07-28 LAB — APTT: aPTT: 25 seconds (ref 24–36)

## 2018-07-28 LAB — GLUCOSE, CAPILLARY: Glucose-Capillary: 189 mg/dL — ABNORMAL HIGH (ref 70–99)

## 2018-07-28 LAB — HEMOGLOBIN A1C
Hgb A1c MFr Bld: 6.1 % — ABNORMAL HIGH (ref 4.8–5.6)
Mean Plasma Glucose: 128.37 mg/dL

## 2018-07-28 LAB — PROTIME-INR
INR: 1 (ref 0.8–1.2)
Prothrombin Time: 13.5 seconds (ref 11.4–15.2)

## 2018-07-28 LAB — ABO/RH: ABO/RH(D): A POS

## 2018-07-28 LAB — SURGICAL PCR SCREEN
MRSA, PCR: NEGATIVE
Staphylococcus aureus: NEGATIVE

## 2018-07-28 MED ORDER — TRANEXAMIC ACID (OHS) BOLUS VIA INFUSION
15.0000 mg/kg | INTRAVENOUS | Status: AC
  Administered 2018-07-29: 09:00:00 1350 mg via INTRAVENOUS
  Filled 2018-07-28: qty 1350

## 2018-07-28 MED ORDER — EPINEPHRINE PF 1 MG/ML IJ SOLN
0.0000 ug/min | INTRAVENOUS | Status: DC
  Filled 2018-07-28: qty 4

## 2018-07-28 MED ORDER — VANCOMYCIN HCL 10 G IV SOLR
1500.0000 mg | INTRAVENOUS | Status: AC
  Administered 2018-07-29: 1500 mg via INTRAVENOUS
  Filled 2018-07-28: qty 1500

## 2018-07-28 MED ORDER — PHENYLEPHRINE HCL-NACL 20-0.9 MG/250ML-% IV SOLN
30.0000 ug/min | INTRAVENOUS | Status: DC
  Filled 2018-07-28: qty 250

## 2018-07-28 MED ORDER — DOPAMINE-DEXTROSE 3.2-5 MG/ML-% IV SOLN
0.0000 ug/kg/min | INTRAVENOUS | Status: DC
  Filled 2018-07-28: qty 250

## 2018-07-28 MED ORDER — PLASMA-LYTE 148 IV SOLN
INTRAVENOUS | Status: DC
  Filled 2018-07-28: qty 2.5

## 2018-07-28 MED ORDER — POTASSIUM CHLORIDE 2 MEQ/ML IV SOLN
80.0000 meq | INTRAVENOUS | Status: DC
  Filled 2018-07-28: qty 40

## 2018-07-28 MED ORDER — DEXMEDETOMIDINE HCL IN NACL 400 MCG/100ML IV SOLN
0.1000 ug/kg/h | INTRAVENOUS | Status: AC
  Administered 2018-07-29: 09:00:00 0.7 ug/kg/h via INTRAVENOUS
  Filled 2018-07-28: qty 100

## 2018-07-28 MED ORDER — SODIUM CHLORIDE 0.9 % IV SOLN
750.0000 mg | INTRAVENOUS | Status: AC
  Administered 2018-07-29: 750 mg via INTRAVENOUS
  Filled 2018-07-28: qty 750

## 2018-07-28 MED ORDER — TRANEXAMIC ACID (OHS) PUMP PRIME SOLUTION
2.0000 mg/kg | INTRAVENOUS | Status: DC
  Filled 2018-07-28: qty 1.8

## 2018-07-28 MED ORDER — MILRINONE LACTATE IN DEXTROSE 20-5 MG/100ML-% IV SOLN
0.3000 ug/kg/min | INTRAVENOUS | Status: DC
  Filled 2018-07-28: qty 100

## 2018-07-28 MED ORDER — SODIUM CHLORIDE 0.9 % IV SOLN
1.5000 g | INTRAVENOUS | Status: AC
  Administered 2018-07-29: 1.5 g via INTRAVENOUS
  Filled 2018-07-28: qty 1.5

## 2018-07-28 MED ORDER — INSULIN REGULAR(HUMAN) IN NACL 100-0.9 UT/100ML-% IV SOLN
INTRAVENOUS | Status: AC
  Administered 2018-07-29: 09:00:00 1.1 [IU]/h via INTRAVENOUS
  Filled 2018-07-28: qty 100

## 2018-07-28 MED ORDER — TRANEXAMIC ACID 1000 MG/10ML IV SOLN
1.5000 mg/kg/h | INTRAVENOUS | Status: AC
  Administered 2018-07-29: 09:00:00 1.5 mg/kg/h via INTRAVENOUS
  Filled 2018-07-28: qty 25

## 2018-07-28 MED ORDER — MANNITOL 20 % IV SOLN
INTRAVENOUS | Status: DC
  Filled 2018-07-28: qty 13

## 2018-07-28 MED ORDER — NITROGLYCERIN IN D5W 200-5 MCG/ML-% IV SOLN
2.0000 ug/min | INTRAVENOUS | Status: DC
  Filled 2018-07-28: qty 250

## 2018-07-28 MED ORDER — SODIUM CHLORIDE 0.9 % IV SOLN
INTRAVENOUS | Status: DC
  Filled 2018-07-28: qty 30

## 2018-07-28 NOTE — Progress Notes (Signed)
  Coronavirus Screening COVID negative 07/26/18 Have you experienced the following symptoms:  Cough yes/no: No Fever (>100.60F)  yes/no: No Runny nose yes/no: No Sore throat yes/no: No Difficulty breathing/shortness of breath  yes/no: No Have you or a family member traveled in the last 14 days and where? yes/no: No  PCP - Dr Alessandra Grout  Cardiologist - denies  Chest x-ray - today  EKG - 07-28-18  Stress Test - Had one done 12 yrs back in [place].Pt stated it was normal.  ECHO - 07-23-18 epic  Cardiac Cath - 07-20-18 epic  AICD-denies PM-denies LOOP-denies  Sleep Study - denies CPAP - denies  LABS-PCR,A1C,T/S,CBC,CMP,PT-INR,APTT,ABG,UA  ASA-last day 07-27-18 PLAVIX-last day 07-20-18   ERAS-NA  HA1C-today Fasting Blood Sugar - 189. Pt does not recall his lowest bld sugar but states his highest has been around 200. Checks Blood Sugar-  twice a week.  Anesthesia-Y. Pt c/o chest tightness with exertion. Stated " I have chest tightness,palpitations and feel SOB after walking from the car park." At PAT appt., VSS and pt did not appear in distress.   All instructions explained to the pt, with a verbal understanding of the material. Pt agrees to go over the instructions while at home for a better understanding. Pt also instructed to self quarantine .The opportunity to ask questions was provided.

## 2018-07-28 NOTE — Progress Notes (Signed)
Anesthesia Chart Review:  Case: [id number] Date/Time: 07/29/18 0745   Procedures:      CORONARY ARTERY BYPASS GRAFTING (CABG) (N/A Chest)     TRANSESOPHAGEAL ECHOCARDIOGRAM (TEE) (N/A )   Anesthesia type: General   Pre-op diagnosis: CAD   Location: [facility] ROOM 17 / [facility]   Surgeon: Corliss SkainsLightfoot, Harrell O, MD      DISCUSSION: Patient is a 58 year old male scheduled for the above procedure. Recent cardiac cath for evaluation of exertional dyspnea and exertional chest pain revealed 2V CAD.   History includes never smoker, CAD, hypercholesterolemia, DM2 (diet controlled), TIA (12/2016), fatty liver, pulmonary nodules (probable granulomas 04/10/17, see report for recommendations), exertional dyspnea.  Last ASA 07/27/18, last Plavix 07/20/18.  07/26/2018 presurgical COVID-19 test was negative.  Anesthesia team to evaluate on the day of surgery.   VS: BP 115/65   Pulse (!) 57   Temp 36.5 C   Resp 18   Ht 5\' 10"  (1.778 m)   Wt 90 kg   SpO2 99%   BMI 28.47 kg/m    PROVIDERS: Mila PalmerWolters, Sharon, MD is PCP Lance MussVaranasi, Jayadeep, MD is cardiologist   LABS: Labs reviewed: Acceptable for surgery. (all labs ordered are listed, but only abnormal results are displayed)  Labs Reviewed  GLUCOSE, CAPILLARY - Abnormal; Notable for the following components:      Result Value   Glucose-Capillary 189 (*)    All other components within normal limits  CBC - Abnormal; Notable for the following components:   nRBC 0.3 (*)    All other components within normal limits  COMPREHENSIVE METABOLIC PANEL - Abnormal; Notable for the following components:   CO2 17 (*)    Glucose, Bld 175 (*)    All other components within normal limits  HEMOGLOBIN A1C - Abnormal; Notable for the following components:   Hgb A1c MFr Bld 6.1 (*)    All other components within normal limits  SURGICAL PCR SCREEN  APTT  BLOOD GAS, ARTERIAL  PROTIME-INR  URINALYSIS, ROUTINE W REFLEX MICROSCOPIC  TYPE AND SCREEN  ABO/RH    PFTs  07/28/18: FVC 5.15 (108%), FEV1 4.05 (112). FEV1/FVC 79% (103%).   IMAGES: CXR 07/28/18: . FINDINGS: A tiny nodule in the lateral left lung base has been present since 2010, of no significance. The heart, hila, mediastinum, lungs, and pleura are unremarkable. No acute abnormalities. IMPRESSION: No active cardiopulmonary disease.  CT Chest 07/06/18 (Over-read interpretation from CT coronary) IMPRESSION: 1.  No acute findings in the imaged extracardiac chest. 2. Scattered subpleural pulmonary nodules are most likely subpleural lymph nodes. These were evaluated on 04/10/2017. Please see recommendations included there.  CT Chest 04/10/17: IMPRESSION: 1. No acute posttraumatic changes demonstrated in the chest, abdomen, or pelvis. 2. Mild diffuse fatty infiltration of the liver. 3. Coronary artery and aortic atherosclerosis. 4. Multiple bilateral pulmonary nodules measuring up to 5 mm diameter. Some are calcified. Probable granulomas. No follow-up needed if patient is low-risk (and has no known or suspected primary neoplasm). Non-contrast chest CT can be considered in 12 months if patient is high-risk. This recommendation follows the consensus statement: Guidelines for Management of Incidental Pulmonary Nodules Detected on CT Images: From the Fleischner Society 2017; Radiology 2017; 284:228-243.   EKG: 07/28/18: SB at 55 BPM   CV: Carotid US 07/28/18: PRELIMINARY FINDINGS Summary: Right Carotid: Velocities in the right ICA are consistent with a 1-39% stenosis. Left Carotid: Velocities in the left ICA are consistent with a 1-39% stenosis.   Cardiac cath  07/20/18 (recommended following 07/06/18 coronary CT/FFR):  Mid RCA lesion is 50% stenosed. Not significant by DFR: 0.99.  2nd Mrg lesion is 90% stenosed.  Dist LM lesion is 40% stenosed.  Mid LAD-1 lesion is 75% stenosed.  Mid LAD-2 lesion is 75% stenosed.  Ost LAD to Prox LAD lesion is 70% stenosed.  Prox LAD to Mid LAD  lesion is 50% stenosed.  The left ventricular systolic function is normal.  LV end diastolic pressure is normal.  The left ventricular ejection fraction is 55-65% by visual estimate.  There is no aortic valve stenosis.  Hiogh bifurcation of radial artery and ulnar artery leading to more spasm. Would not use radial approach in the future if cath was needed. Severe diffuse 2 vessel CAD.  Diffuse LAD disease and subtotally occluded OM.  Moderate RCA disease, not significant by DFR.  Given diffuse LAD disease including the ostium, I think LIMA to LAD will be a better long term strategy for revascularization.      Echo 06/21/18: IMPRESSIONS  1. The left ventricle has low normal systolic function, with an ejection fraction of 50-55%. The cavity size was normal. There is mildly increased left ventricular wall thickness. Left ventricular diastolic parameters were normal.  2. The right ventricle has normal systolic function. The cavity was normal. There is no increase in right ventricular wall thickness.  3. Mild thickening of the mitral valve leaflet.  4. The aortic valve is tricuspid. Mild thickening of the aortic valve. Mild calcification of the aortic valve.   Cardiac event monitor 2/4/192/10/19:  Normal sinus rhythm.  No pathologic arrhtyhmias.  No arrhythmias that correlate to the symptoms reported.   Past Medical History:  Diagnosis Date  . Actinic keratoses   . Ankle dislocation    calcaneal tendons  . Anxiety   . Aortic atherosclerosis ([place])   . Chronic pain of left knee 11/18/2017  . Coronary artery disease   . DDD (degenerative disc disease), cervical   . Diabetes mellitus without complication ([place])    1027  . Displaced other extraarticular fracture of right calcaneus, initial encounter for closed fracture 07/13/2017  . DOE (dyspnea on exertion)   . Dyspnea   . Fatty liver    mild  . Fracture of distal end of right fibula   . Hypercholesteremia   .  Hypercholesterolemia   . Hypertension   . Lightheadedness   . Neck pain   . Prediabetes   . Pulmonary nodules   . Stress reaction   . Stroke ([place])    2018  . TIA (transient ischemic attack)    circulation     Past Surgical History:  Procedure Laterality Date  . INTRAVASCULAR PRESSURE WIRE/FFR STUDY N/A 07/20/2018   Procedure: INTRAVASCULAR PRESSURE WIRE/FFR STUDY;  Surgeon: Jettie Booze, MD;  Location: [facility];  Service: Cardiovascular;  Laterality: N/A;  . LEFT HEART CATH AND CORONARY ANGIOGRAPHY N/A 07/20/2018   Procedure: LEFT HEART CATH AND CORONARY ANGIOGRAPHY;  Surgeon: Jettie Booze, MD;  Location: [facility];  Service: Cardiovascular;  Laterality: N/A;    MEDICATIONS: . atorvastatin (LIPITOR) 80 MG tablet  . metoprolol tartrate (LOPRESSOR) 25 MG tablet  . nitroGLYCERIN (NITROSTAT) 0.4 MG SL tablet   No current facility-administered medications for this encounter.    Derrill Memo ON 07/29/2018] cefUROXime (ZINACEF) 1.5 g in sodium chloride 0.9 % 100 mL IVPB  . [START ON 07/29/2018] cefUROXime (ZINACEF) 750 mg in sodium chloride 0.9 % 100 mL IVPB  . [  START ON 07/29/2018] dexmedetomidine (PRECEDEX) 400 MCG/100ML (4 mcg/mL) infusion  . [START ON 07/29/2018] DOPamine (INTROPIN) 800 mg in dextrose 5 % 250 mL (3.2 mg/mL) infusion  . [START ON 07/29/2018] EPINEPHrine (ADRENALIN) 4 mg in dextrose 5 % 250 mL (0.016 mg/mL) infusion  . [START ON 07/29/2018] heparin 2,500 Units, papaverine 30 mg in electrolyte-148 (PLASMALYTE-148) 500 mL irrigation  . [START ON 07/29/2018] heparin 30,000 units/NS 1000 mL solution for CELLSAVER  . [START ON 07/29/2018] insulin regular, human (MYXREDLIN) 100 units/ 100 mL infusion  . [START ON 07/29/2018] [facility] Blood Cardioplegia vial (lidocaine/magnesium/mannitol 0.26g-4g-6.4g)  . [START ON 07/29/2018] milrinone (PRIMACOR) 20 MG/100 ML (0.2 mg/mL) infusion  . [START ON 07/29/2018] nitroGLYCERIN 50 mg in dextrose 5 % 250 mL (0.2  mg/mL) infusion  . [START ON 07/29/2018] phenylephrine (NEOSYNEPHRINE) 20-0.9 MG/250ML-% infusion  . [START ON 07/29/2018] potassium chloride injection 80 mEq  . [START ON 07/29/2018] tranexamic acid (CYKLOKAPRON) 2,500 mg in sodium chloride 0.9 % 250 mL (10 mg/mL) infusion  . [START ON 07/29/2018] tranexamic acid (CYKLOKAPRON) bolus via infusion - over 30 minutes 1,350 mg  . [START ON 07/29/2018] tranexamic acid (CYKLOKAPRON) pump prime solution 180 mg  . [START ON 07/29/2018] vancomycin (VANCOCIN) 1,500 mg in sodium chloride 0.9 % 250 mL IVPB    Shonna ChockAllison Jalasia Eskridge, PA-C Surgical Short Stay/Anesthesiology [facility] Phone [phone] [facility] Phone [phone] 07/28/2018 2:21 PM

## 2018-07-28 NOTE — Anesthesia Preprocedure Evaluation (Addendum)
Anesthesia Evaluation  Patient identified by MRN, date of birth, ID band Patient awake    Reviewed: Allergy & Precautions, NPO status , Patient's Chart, lab work & pertinent test results  Airway Mallampati: II  TM Distance: >3 FB Neck ROM: Full    Dental no notable dental hx.    Pulmonary neg pulmonary ROS,    Pulmonary exam normal breath sounds clear to auscultation       Cardiovascular hypertension, + CAD and + DOE  Normal cardiovascular exam Rhythm:Regular Rate:Normal  . The left ventricle has low normal systolic function, with an ejection fraction of 50-55%. The cavity size was normal. There is mildly increased left ventricular wall thickness. Left ventricular diastolic parameters were normal.  2. The right ventricle has normal systolic function. The cavity was normal. There is no increase in right ventricular wall thickness.  3. Mild thickening of the mitral valve leaflet.  4. The aortic valve is tricuspid. Mild thickening of the aortic valve. Mild calcification of the aortic valve.  FINDINGS  Left Ventricle: The left ventricle has low normal systolic function, with an ejection fraction of 50-55%. The cavity size was normal. There is mildly increased left ventricular wall thickness. Left ventricular diastolic parameters were normal.  Right Ventricle: The right ventricle has normal systolic function. The cavity was normal. There is no increase in right ventricular wall thickness.  Left Atrium: Left atrial size was normal in size.  Right Atrium: Right atrial size was normal in size. Right atrial pressure is estimated at 10 mmHg.  Interatrial Septum: No atrial level shunt detected by color flow Doppler.  Pericardium: There is no evidence of pericardial effusion.  Mitral Valve: The mitral valve is normal in structure. Mild thickening of the mitral valve leaflet. Mitral valve regurgitation is trivial by color flow  Doppler.    Neuro/Psych TIAnegative psych ROS   GI/Hepatic negative GI ROS, Neg liver ROS,   Endo/Other  negative endocrine ROSdiabetes  Renal/GU negative Renal ROS  negative genitourinary   Musculoskeletal negative musculoskeletal ROS (+)   Abdominal   Peds negative pediatric ROS (+)  Hematology negative hematology ROS (+)   Anesthesia Other Findings   Reproductive/Obstetrics negative OB ROS                            Anesthesia Physical Anesthesia Plan  ASA: III  Anesthesia Plan: General   Post-op Pain Management:    Induction: Intravenous  PONV Risk Score and Plan: 0  Airway Management Planned: Oral ETT  Additional Equipment: Arterial line, CVP, PA Cath, TEE and Ultrasound Guidance Line Placement  Intra-op Plan:   Post-operative Plan: Extubation in OR  Informed Consent: I have reviewed the patients History and Physical, chart, labs and discussed the procedure including the risks, benefits and alternatives for the proposed anesthesia with the patient or authorized representative who has indicated his/her understanding and acceptance.     Dental advisory given  Plan Discussed with: CRNA and Surgeon  Anesthesia Plan Comments: (PAT note written 07/28/2018 by Myra Gianotti, PA-C. )       Anesthesia Quick Evaluation

## 2018-07-28 NOTE — Progress Notes (Signed)
Pre CABG duplex studies       Have been completed. Preliminary results can be found under CV proc through chart review. June Leap, BS, RDMS, RVT

## 2018-07-29 ENCOUNTER — Encounter (HOSPITAL_COMMUNITY)
Admission: RE | Disposition: A | Discharge status: Home / Self Care | Payer: Self-pay | Source: Home / Self Care | Attending: Thoracic Surgery (Cardiothoracic Vascular Surgery)

## 2018-07-29 ENCOUNTER — Inpatient Hospital Stay (HOSPITAL_COMMUNITY): Payer: Medicare HMO | Admitting: Anesthesiology

## 2018-07-29 ENCOUNTER — Inpatient Hospital Stay (HOSPITAL_COMMUNITY)
Admission: RE | Admit: 2018-07-29 | Discharge: 2018-08-02 | DRG: 236 | Disposition: A | Discharge status: Home / Self Care | Payer: Medicare HMO | Attending: Thoracic Surgery (Cardiothoracic Vascular Surgery) | Admitting: Thoracic Surgery (Cardiothoracic Vascular Surgery)

## 2018-07-29 ENCOUNTER — Inpatient Hospital Stay (HOSPITAL_COMMUNITY): Payer: Medicare HMO

## 2018-07-29 ENCOUNTER — Other Ambulatory Visit: Payer: Self-pay

## 2018-07-29 DIAGNOSIS — I25119 Atherosclerotic heart disease of native coronary artery with unspecified angina pectoris: Principal | ICD-10-CM | POA: Diagnosis present

## 2018-07-29 DIAGNOSIS — R110 Nausea: Secondary | ICD-10-CM | POA: Diagnosis not present

## 2018-07-29 DIAGNOSIS — I700 Atherosclerosis of aorta: Secondary | ICD-10-CM | POA: Diagnosis not present

## 2018-07-29 DIAGNOSIS — D62 Acute posthemorrhagic anemia: Secondary | ICD-10-CM | POA: Diagnosis not present

## 2018-07-29 DIAGNOSIS — K760 Fatty (change of) liver, not elsewhere classified: Secondary | ICD-10-CM | POA: Diagnosis not present

## 2018-07-29 DIAGNOSIS — R69 Illness, unspecified: Secondary | ICD-10-CM | POA: Diagnosis not present

## 2018-07-29 DIAGNOSIS — I10 Essential (primary) hypertension: Secondary | ICD-10-CM | POA: Diagnosis not present

## 2018-07-29 DIAGNOSIS — J90 Pleural effusion, not elsewhere classified: Secondary | ICD-10-CM | POA: Diagnosis not present

## 2018-07-29 DIAGNOSIS — E7800 Pure hypercholesterolemia, unspecified: Secondary | ICD-10-CM | POA: Diagnosis present

## 2018-07-29 DIAGNOSIS — Z79899 Other long term (current) drug therapy: Secondary | ICD-10-CM | POA: Diagnosis not present

## 2018-07-29 DIAGNOSIS — R1310 Dysphagia, unspecified: Secondary | ICD-10-CM | POA: Diagnosis not present

## 2018-07-29 DIAGNOSIS — G8929 Other chronic pain: Secondary | ICD-10-CM | POA: Diagnosis not present

## 2018-07-29 DIAGNOSIS — Z951 Presence of aortocoronary bypass graft: Secondary | ICD-10-CM | POA: Diagnosis not present

## 2018-07-29 DIAGNOSIS — I2510 Atherosclerotic heart disease of native coronary artery without angina pectoris: Secondary | ICD-10-CM | POA: Diagnosis not present

## 2018-07-29 DIAGNOSIS — Z7902 Long term (current) use of antithrombotics/antiplatelets: Secondary | ICD-10-CM

## 2018-07-29 DIAGNOSIS — E119 Type 2 diabetes mellitus without complications: Secondary | ICD-10-CM | POA: Diagnosis not present

## 2018-07-29 DIAGNOSIS — Z8249 Family history of ischemic heart disease and other diseases of the circulatory system: Secondary | ICD-10-CM | POA: Diagnosis not present

## 2018-07-29 DIAGNOSIS — Z8673 Personal history of transient ischemic attack (TIA), and cerebral infarction without residual deficits: Secondary | ICD-10-CM | POA: Diagnosis not present

## 2018-07-29 DIAGNOSIS — Z9889 Other specified postprocedural states: Secondary | ICD-10-CM

## 2018-07-29 DIAGNOSIS — Z09 Encounter for follow-up examination after completed treatment for conditions other than malignant neoplasm: Secondary | ICD-10-CM

## 2018-07-29 DIAGNOSIS — Z885 Allergy status to narcotic agent status: Secondary | ICD-10-CM

## 2018-07-29 DIAGNOSIS — F419 Anxiety disorder, unspecified: Secondary | ICD-10-CM | POA: Diagnosis present

## 2018-07-29 DIAGNOSIS — Z888 Allergy status to other drugs, medicaments and biological substances status: Secondary | ICD-10-CM

## 2018-07-29 DIAGNOSIS — I340 Nonrheumatic mitral (valve) insufficiency: Secondary | ICD-10-CM | POA: Diagnosis not present

## 2018-07-29 DIAGNOSIS — J9811 Atelectasis: Secondary | ICD-10-CM | POA: Diagnosis not present

## 2018-07-29 HISTORY — PX: CORONARY ARTERY BYPASS GRAFT: SHX141

## 2018-07-29 HISTORY — PX: TEE WITHOUT CARDIOVERSION: SHX5443

## 2018-07-29 HISTORY — PX: ENDOVEIN HARVEST OF GREATER SAPHENOUS VEIN: SHX5059

## 2018-07-29 LAB — POCT I-STAT 7, (LYTES, BLD GAS, ICA,H+H)
Acid-base deficit: 4 mmol/L — ABNORMAL HIGH (ref 0.0–2.0)
Acid-base deficit: 4 mmol/L — ABNORMAL HIGH (ref 0.0–2.0)
Acid-base deficit: 4 mmol/L — ABNORMAL HIGH (ref 0.0–2.0)
Acid-base deficit: 7 mmol/L — ABNORMAL HIGH (ref 0.0–2.0)
Acid-base deficit: 7 mmol/L — ABNORMAL HIGH (ref 0.0–2.0)
Bicarbonate: 24.9 mmol/L (ref 20.0–28.0)
Bicarbonate: 21.2 mmol/L (ref 20.0–28.0)
Bicarbonate: 21.8 mmol/L (ref 20.0–28.0)
Bicarbonate: 22.1 mmol/L (ref 20.0–28.0)
Bicarbonate: 15.7 mmol/L — ABNORMAL LOW (ref 20.0–28.0)
Bicarbonate: 17.7 mmol/L — ABNORMAL LOW (ref 20.0–28.0)
Calcium, Ion: 0.95 mmol/L — ABNORMAL LOW (ref 1.15–1.40)
Calcium, Ion: 1.14 mmol/L — ABNORMAL LOW (ref 1.15–1.40)
Calcium, Ion: 1.14 mmol/L — ABNORMAL LOW (ref 1.15–1.40)
Calcium, Ion: 1.11 mmol/L — ABNORMAL LOW (ref 1.15–1.40)
Calcium, Ion: 0.95 mmol/L — ABNORMAL LOW (ref 1.15–1.40)
Calcium, Ion: 0.98 mmol/L — ABNORMAL LOW (ref 1.15–1.40)
HCT: 26 % — ABNORMAL LOW (ref 39.0–52.0)
HCT: 24 % — ABNORMAL LOW (ref 39.0–52.0)
HCT: 29 % — ABNORMAL LOW (ref 39.0–52.0)
HCT: 25 % — ABNORMAL LOW (ref 39.0–52.0)
HCT: 20 % — ABNORMAL LOW (ref 39.0–52.0)
HCT: 22 % — ABNORMAL LOW (ref 39.0–52.0)
Hemoglobin: 8.8 g/dL — ABNORMAL LOW (ref 13.0–17.0)
Hemoglobin: 8.2 g/dL — ABNORMAL LOW (ref 13.0–17.0)
Hemoglobin: 9.9 g/dL — ABNORMAL LOW (ref 13.0–17.0)
Hemoglobin: 8.5 g/dL — ABNORMAL LOW (ref 13.0–17.0)
Hemoglobin: 6.8 g/dL — CL (ref 13.0–17.0)
Hemoglobin: 7.5 g/dL — ABNORMAL LOW (ref 13.0–17.0)
O2 Saturation: 100 %
O2 Saturation: 100 %
O2 Saturation: 100 %
O2 Saturation: 100 %
O2 Saturation: 100 %
O2 Saturation: 99 %
Patient temperature: 35.7
Patient temperature: 35.4
Patient temperature: 36.8
Patient temperature: 37.1
Potassium: 4.1 mmol/L (ref 3.5–5.1)
Potassium: 3.9 mmol/L (ref 3.5–5.1)
Potassium: 3.5 mmol/L (ref 3.5–5.1)
Potassium: 3.5 mmol/L (ref 3.5–5.1)
Potassium: 3.5 mmol/L (ref 3.5–5.1)
Potassium: 3.9 mmol/L (ref 3.5–5.1)
Sodium: 141 mmol/L (ref 135–145)
Sodium: 141 mmol/L (ref 135–145)
Sodium: 141 mmol/L (ref 135–145)
Sodium: 142 mmol/L (ref 135–145)
Sodium: 142 mmol/L (ref 135–145)
Sodium: 143 mmol/L (ref 135–145)
TCO2: 26 mmol/L (ref 22–32)
TCO2: 22 mmol/L (ref 22–32)
TCO2: 23 mmol/L (ref 22–32)
TCO2: 23 mmol/L (ref 22–32)
TCO2: 16 mmol/L — ABNORMAL LOW (ref 22–32)
TCO2: 19 mmol/L — ABNORMAL LOW (ref 22–32)
pCO2 arterial: 42 mmHg (ref 32.0–48.0)
pCO2 arterial: 38.9 mmHg (ref 32.0–48.0)
pCO2 arterial: 38.8 mmHg (ref 32.0–48.0)
pCO2 arterial: 40.3 mmHg (ref 32.0–48.0)
pCO2 arterial: 21.5 mmHg — ABNORMAL LOW (ref 32.0–48.0)
pCO2 arterial: 32.2 mmHg (ref 32.0–48.0)
pH, Arterial: 7.382 (ref 7.350–7.450)
pH, Arterial: 7.343 — ABNORMAL LOW (ref 7.350–7.450)
pH, Arterial: 7.352 (ref 7.350–7.450)
pH, Arterial: 7.339 — ABNORMAL LOW (ref 7.350–7.450)
pH, Arterial: 7.47 — ABNORMAL HIGH (ref 7.350–7.450)
pH, Arterial: 7.349 — ABNORMAL LOW (ref 7.350–7.450)
pO2, Arterial: 403 mmHg — ABNORMAL HIGH (ref 83.0–108.0)
pO2, Arterial: 337 mmHg — ABNORMAL HIGH (ref 83.0–108.0)
pO2, Arterial: 184 mmHg — ABNORMAL HIGH (ref 83.0–108.0)
pO2, Arterial: 182 mmHg — ABNORMAL HIGH (ref 83.0–108.0)
pO2, Arterial: 213 mmHg — ABNORMAL HIGH (ref 83.0–108.0)
pO2, Arterial: 141 mmHg — ABNORMAL HIGH (ref 83.0–108.0)

## 2018-07-29 LAB — POCT I-STAT 4, (NA,K, GLUC, HGB,HCT)
Glucose, Bld: 115 mg/dL — ABNORMAL HIGH (ref 70–99)
Glucose, Bld: 120 mg/dL — ABNORMAL HIGH (ref 70–99)
Glucose, Bld: 148 mg/dL — ABNORMAL HIGH (ref 70–99)
Glucose, Bld: 114 mg/dL — ABNORMAL HIGH (ref 70–99)
Glucose, Bld: 115 mg/dL — ABNORMAL HIGH (ref 70–99)
Glucose, Bld: 92 mg/dL (ref 70–99)
Glucose, Bld: 106 mg/dL — ABNORMAL HIGH (ref 70–99)
HCT: 40 % (ref 39.0–52.0)
HCT: 30 % — ABNORMAL LOW (ref 39.0–52.0)
HCT: 37 % — ABNORMAL LOW (ref 39.0–52.0)
HCT: 25 % — ABNORMAL LOW (ref 39.0–52.0)
HCT: 28 % — ABNORMAL LOW (ref 39.0–52.0)
HCT: 24 % — ABNORMAL LOW (ref 39.0–52.0)
HCT: 30 % — ABNORMAL LOW (ref 39.0–52.0)
Hemoglobin: 13.6 g/dL (ref 13.0–17.0)
Hemoglobin: 10.2 g/dL — ABNORMAL LOW (ref 13.0–17.0)
Hemoglobin: 12.6 g/dL — ABNORMAL LOW (ref 13.0–17.0)
Hemoglobin: 8.5 g/dL — ABNORMAL LOW (ref 13.0–17.0)
Hemoglobin: 9.5 g/dL — ABNORMAL LOW (ref 13.0–17.0)
Hemoglobin: 8.2 g/dL — ABNORMAL LOW (ref 13.0–17.0)
Hemoglobin: 10.2 g/dL — ABNORMAL LOW (ref 13.0–17.0)
Potassium: 3.8 mmol/L (ref 3.5–5.1)
Potassium: 3.9 mmol/L (ref 3.5–5.1)
Potassium: 4.1 mmol/L (ref 3.5–5.1)
Potassium: 4.6 mmol/L (ref 3.5–5.1)
Potassium: 4.8 mmol/L (ref 3.5–5.1)
Potassium: 3.8 mmol/L (ref 3.5–5.1)
Potassium: 3.5 mmol/L (ref 3.5–5.1)
Sodium: 138 mmol/L (ref 135–145)
Sodium: 137 mmol/L (ref 135–145)
Sodium: 141 mmol/L (ref 135–145)
Sodium: 135 mmol/L (ref 135–145)
Sodium: 135 mmol/L (ref 135–145)
Sodium: 141 mmol/L (ref 135–145)
Sodium: 141 mmol/L (ref 135–145)

## 2018-07-29 LAB — TYPE AND SCREEN
ABO/RH(D): A POS
Antibody Screen: NEGATIVE
Unit division: 0
Unit division: 0

## 2018-07-29 LAB — GLUCOSE, CAPILLARY
Glucose-Capillary: 111 mg/dL — ABNORMAL HIGH (ref 70–99)
Glucose-Capillary: 67 mg/dL — ABNORMAL LOW (ref 70–99)
Glucose-Capillary: 109 mg/dL — ABNORMAL HIGH (ref 70–99)
Glucose-Capillary: 97 mg/dL (ref 70–99)
Glucose-Capillary: 126 mg/dL — ABNORMAL HIGH (ref 70–99)
Glucose-Capillary: 114 mg/dL — ABNORMAL HIGH (ref 70–99)
Glucose-Capillary: 119 mg/dL — ABNORMAL HIGH (ref 70–99)
Glucose-Capillary: 133 mg/dL — ABNORMAL HIGH (ref 70–99)
Glucose-Capillary: 71 mg/dL (ref 70–99)
Glucose-Capillary: 153 mg/dL — ABNORMAL HIGH (ref 70–99)
Glucose-Capillary: 155 mg/dL — ABNORMAL HIGH (ref 70–99)

## 2018-07-29 LAB — BPAM RBC
Blood Product Expiration Date: 202008142359
Blood Product Expiration Date: 202008142359
ISSUE DATE / TIME: 202007231102
ISSUE DATE / TIME: 202007231102
Unit Type and Rh: 6200
Unit Type and Rh: 6200

## 2018-07-29 LAB — POCT I-STAT, CHEM 8
BUN: 13 mg/dL (ref 6–20)
Calcium, Ion: 1.08 mmol/L — ABNORMAL LOW (ref 1.15–1.40)
Chloride: 105 mmol/L (ref 98–111)
Creatinine, Ser: 0.8 mg/dL (ref 0.61–1.24)
Glucose, Bld: 161 mg/dL — ABNORMAL HIGH (ref 70–99)
HCT: 25 % — ABNORMAL LOW (ref 39.0–52.0)
Hemoglobin: 8.5 g/dL — ABNORMAL LOW (ref 13.0–17.0)
Potassium: 4.1 mmol/L (ref 3.5–5.1)
Sodium: 139 mmol/L (ref 135–145)
TCO2: 20 mmol/L — ABNORMAL LOW (ref 22–32)

## 2018-07-29 LAB — HEMOGLOBIN AND HEMATOCRIT, BLOOD
HCT: 25.7 % — ABNORMAL LOW (ref 39.0–52.0)
Hemoglobin: 8.7 g/dL — ABNORMAL LOW (ref 13.0–17.0)

## 2018-07-29 LAB — CBC
HCT: 30.3 % — ABNORMAL LOW (ref 39.0–52.0)
HCT: 24.6 % — ABNORMAL LOW (ref 39.0–52.0)
Hemoglobin: 10.4 g/dL — ABNORMAL LOW (ref 13.0–17.0)
Hemoglobin: 8.4 g/dL — ABNORMAL LOW (ref 13.0–17.0)
MCH: 31.2 pg (ref 26.0–34.0)
MCH: 31.3 pg (ref 26.0–34.0)
MCHC: 34.3 g/dL (ref 30.0–36.0)
MCHC: 34.1 g/dL (ref 30.0–36.0)
MCV: 91 fL (ref 80.0–100.0)
MCV: 91.8 fL (ref 80.0–100.0)
Platelets: 121 10*3/uL — ABNORMAL LOW (ref 150–400)
Platelets: 114 10*3/uL — ABNORMAL LOW (ref 150–400)
RBC: 3.33 MIL/uL — ABNORMAL LOW (ref 4.22–5.81)
RBC: 2.68 MIL/uL — ABNORMAL LOW (ref 4.22–5.81)
RDW: 13 % (ref 11.5–15.5)
RDW: 13.2 % (ref 11.5–15.5)
WBC: 10.6 10*3/uL — ABNORMAL HIGH (ref 4.0–10.5)
WBC: 11.3 10*3/uL — ABNORMAL HIGH (ref 4.0–10.5)
nRBC: 0 % (ref 0.0–0.2)
nRBC: 0 % (ref 0.0–0.2)

## 2018-07-29 LAB — PREPARE RBC (CROSSMATCH)

## 2018-07-29 LAB — MAGNESIUM: Magnesium: 2.8 mg/dL — ABNORMAL HIGH (ref 1.7–2.4)

## 2018-07-29 LAB — PROTIME-INR
INR: 2 — ABNORMAL HIGH (ref 0.8–1.2)
Prothrombin Time: 22.8 seconds — ABNORMAL HIGH (ref 11.4–15.2)

## 2018-07-29 LAB — ECHO INTRAOPERATIVE TEE

## 2018-07-29 LAB — APTT: aPTT: 32 seconds (ref 24–36)

## 2018-07-29 LAB — PLATELET COUNT: Platelets: 141 10*3/uL — ABNORMAL LOW (ref 150–400)

## 2018-07-29 SURGERY — CORONARY ARTERY BYPASS GRAFTING (CABG)
Anesthesia: General | Site: Thigh

## 2018-07-29 MED ORDER — PROTAMINE SULFATE 10 MG/ML IV SOLN
INTRAVENOUS | Status: AC
  Filled 2018-07-29: qty 5

## 2018-07-29 MED ORDER — ORAL CARE MOUTH RINSE: 15.0000 mL | Freq: Two times a day (BID) | OROMUCOSAL | Status: DC

## 2018-07-29 MED ORDER — FENTANYL CITRATE (PF) 250 MCG/5ML IJ SOLN
INTRAMUSCULAR | Status: AC
  Filled 2018-07-29: qty 30

## 2018-07-29 MED ORDER — PROPOFOL 10 MG/ML IV BOLUS
INTRAVENOUS | Status: AC
  Filled 2018-07-29: qty 20

## 2018-07-29 MED ORDER — SODIUM CHLORIDE 0.9 % IV SOLN
1.5000 g | Freq: Two times a day (BID) | INTRAVENOUS | Status: AC
  Administered 2018-07-29 – 2018-07-31 (×4): 1.5 g via INTRAVENOUS
  Filled 2018-07-29 (×4): qty 1.5

## 2018-07-29 MED ORDER — SODIUM CHLORIDE (PF) 0.9 % IJ SOLN
OROMUCOSAL | Status: DC | PRN
  Administered 2018-07-29 (×2): 4 mL via TOPICAL

## 2018-07-29 MED ORDER — PHENYLEPHRINE 40 MCG/ML (10ML) SYRINGE FOR IV PUSH (FOR BLOOD PRESSURE SUPPORT)
PREFILLED_SYRINGE | INTRAVENOUS | Status: DC | PRN
  Administered 2018-07-29: 40 ug via INTRAVENOUS
  Administered 2018-07-29: 80 ug via INTRAVENOUS

## 2018-07-29 MED ORDER — CALCIUM CHLORIDE 10 % IV SOLN
INTRAVENOUS | Status: AC
  Filled 2018-07-29: qty 20

## 2018-07-29 MED ORDER — SODIUM CHLORIDE 0.9 % IV SOLN
INTRAVENOUS | Status: DC
  Administered 2018-07-29: 13:00:00 via INTRAVENOUS

## 2018-07-29 MED ORDER — ACETAMINOPHEN 160 MG/5ML PO SOLN: 1000.0000 mg | Freq: Four times a day (QID) | ORAL | Status: DC

## 2018-07-29 MED ORDER — LACTATED RINGERS IV SOLN
INTRAVENOUS | Status: DC
  Administered 2018-07-29: 13:00:00 via INTRAVENOUS

## 2018-07-29 MED ORDER — SODIUM CHLORIDE 0.9 % IV SOLN: 250.0000 mL | INTRAVENOUS | Status: DC

## 2018-07-29 MED ORDER — ATORVASTATIN CALCIUM 80 MG PO TABS
80.0000 mg | ORAL_TABLET | Freq: Every day | ORAL | Status: DC
  Administered 2018-07-30 – 2018-08-02 (×4): 80 mg via ORAL
  Filled 2018-07-29 (×4): qty 1

## 2018-07-29 MED ORDER — ARTIFICIAL TEARS OPHTHALMIC OINT
TOPICAL_OINTMENT | OPHTHALMIC | Status: DC | PRN
  Administered 2018-07-29: 1 via OPHTHALMIC

## 2018-07-29 MED ORDER — METOCLOPRAMIDE HCL 5 MG/ML IJ SOLN
10.0000 mg | Freq: Four times a day (QID) | INTRAMUSCULAR | Status: AC
  Administered 2018-07-29 – 2018-07-30 (×4): 10 mg via INTRAVENOUS
  Filled 2018-07-29 (×3): qty 2

## 2018-07-29 MED ORDER — CHLORHEXIDINE GLUCONATE CLOTH 2 % EX PADS
6.0000 | MEDICATED_PAD | Freq: Every day | CUTANEOUS | Status: DC
  Administered 2018-07-30 – 2018-08-01 (×3): 6 via TOPICAL

## 2018-07-29 MED ORDER — HEPARIN SODIUM (PORCINE) 1000 UNIT/ML IJ SOLN
INTRAMUSCULAR | Status: DC | PRN
  Administered 2018-07-29: 32000 [IU] via INTRAVENOUS

## 2018-07-29 MED ORDER — PANTOPRAZOLE SODIUM 40 MG PO TBEC
40.0000 mg | DELAYED_RELEASE_TABLET | Freq: Every day | ORAL | Status: DC
  Administered 2018-07-31 – 2018-08-02 (×3): 40 mg via ORAL
  Filled 2018-07-29 (×3): qty 1

## 2018-07-29 MED ORDER — ARTIFICIAL TEARS OPHTHALMIC OINT
TOPICAL_OINTMENT | OPHTHALMIC | Status: AC
  Filled 2018-07-29: qty 3.5

## 2018-07-29 MED ORDER — PHENYLEPHRINE HCL-NACL 20-0.9 MG/250ML-% IV SOLN
0.0000 ug/min | INTRAVENOUS | Status: DC
  Administered 2018-07-30: 40 ug/min via INTRAVENOUS
  Filled 2018-07-29: qty 250

## 2018-07-29 MED ORDER — METOPROLOL TARTRATE 25 MG/10 ML ORAL SUSPENSION: 12.5000 mg | Freq: Two times a day (BID) | ORAL | Status: DC

## 2018-07-29 MED ORDER — METOPROLOL TARTRATE 12.5 MG HALF TABLET
12.5000 mg | ORAL_TABLET | Freq: Two times a day (BID) | ORAL | Status: DC
  Administered 2018-07-30 – 2018-07-31 (×2): 12.5 mg via ORAL
  Filled 2018-07-29 (×3): qty 1

## 2018-07-29 MED ORDER — LACTATED RINGERS IV SOLN: INTRAVENOUS | Status: DC

## 2018-07-29 MED ORDER — ACETAMINOPHEN 500 MG PO TABS
1000.0000 mg | ORAL_TABLET | Freq: Four times a day (QID) | ORAL | Status: DC
  Administered 2018-07-30 – 2018-08-02 (×9): 1000 mg via ORAL
  Filled 2018-07-29 (×10): qty 2

## 2018-07-29 MED ORDER — METOPROLOL TARTRATE 5 MG/5ML IV SOLN: 2.5000 mg | INTRAVENOUS | Status: DC | PRN

## 2018-07-29 MED ORDER — ETOMIDATE 2 MG/ML IV SOLN
INTRAVENOUS | Status: AC
  Filled 2018-07-29: qty 10

## 2018-07-29 MED ORDER — SODIUM CHLORIDE 0.45 % IV SOLN: INTRAVENOUS | Status: DC | PRN

## 2018-07-29 MED ORDER — LACTATED RINGERS IV SOLN
INTRAVENOUS | Status: DC | PRN
  Administered 2018-07-29 (×2): via INTRAVENOUS

## 2018-07-29 MED ORDER — INSULIN REGULAR BOLUS VIA INFUSION
0.0000 [IU] | Freq: Three times a day (TID) | INTRAVENOUS | Status: DC
  Filled 2018-07-29: qty 10

## 2018-07-29 MED ORDER — CHLORHEXIDINE GLUCONATE 0.12 % MT SOLN
15.0000 mL | Freq: Once | OROMUCOSAL | Status: AC
  Administered 2018-07-29: 07:00:00 15 mL via OROMUCOSAL

## 2018-07-29 MED ORDER — ROCURONIUM BROMIDE 10 MG/ML (PF) SYRINGE
PREFILLED_SYRINGE | INTRAVENOUS | Status: AC
  Filled 2018-07-29: qty 10

## 2018-07-29 MED ORDER — SODIUM CHLORIDE 0.9 % IV SOLN
INTRAVENOUS | Status: DC | PRN
  Administered 2018-07-29: 09:00:00 20 ug/min via INTRAVENOUS

## 2018-07-29 MED ORDER — NITROGLYCERIN IN D5W 200-5 MCG/ML-% IV SOLN: 0.0000 ug/min | INTRAVENOUS | Status: DC

## 2018-07-29 MED ORDER — ASPIRIN EC 325 MG PO TBEC
325.0000 mg | DELAYED_RELEASE_TABLET | Freq: Every day | ORAL | Status: DC
  Administered 2018-07-30 – 2018-08-02 (×4): 325 mg via ORAL
  Filled 2018-07-29 (×5): qty 1

## 2018-07-29 MED ORDER — MIDAZOLAM HCL (PF) 10 MG/2ML IJ SOLN
INTRAMUSCULAR | Status: AC
  Filled 2018-07-29: qty 2

## 2018-07-29 MED ORDER — ROCURONIUM BROMIDE 10 MG/ML (PF) SYRINGE
PREFILLED_SYRINGE | INTRAVENOUS | Status: DC | PRN
  Administered 2018-07-29: 50 mg via INTRAVENOUS
  Administered 2018-07-29: 100 mg via INTRAVENOUS
  Administered 2018-07-29: 50 mg via INTRAVENOUS

## 2018-07-29 MED ORDER — MIDAZOLAM HCL 5 MG/5ML IJ SOLN
INTRAMUSCULAR | Status: DC | PRN
  Administered 2018-07-29: 3 mg via INTRAVENOUS
  Administered 2018-07-29: 2 mg via INTRAVENOUS

## 2018-07-29 MED ORDER — BISACODYL 10 MG RE SUPP: 10.0000 mg | Freq: Every day | RECTAL | Status: DC

## 2018-07-29 MED ORDER — PROTAMINE SULFATE 10 MG/ML IV SOLN
INTRAVENOUS | Status: DC | PRN
  Administered 2018-07-29: 320 mg via INTRAVENOUS

## 2018-07-29 MED ORDER — PLASMA-LYTE 148 IV SOLN
INTRAVENOUS | Status: DC | PRN
  Administered 2018-07-29: 500 mL via INTRAVASCULAR

## 2018-07-29 MED ORDER — DEXMEDETOMIDINE HCL IN NACL 200 MCG/50ML IV SOLN
INTRAVENOUS | Status: AC
  Filled 2018-07-29: qty 50

## 2018-07-29 MED ORDER — BISACODYL 5 MG PO TBEC
10.0000 mg | DELAYED_RELEASE_TABLET | Freq: Every day | ORAL | Status: DC
  Administered 2018-07-30 – 2018-08-01 (×3): 10 mg via ORAL
  Filled 2018-07-29 (×4): qty 2

## 2018-07-29 MED ORDER — METOPROLOL TARTRATE 12.5 MG HALF TABLET
12.5000 mg | ORAL_TABLET | Freq: Once | ORAL | Status: DC
  Filled 2018-07-29: qty 1

## 2018-07-29 MED ORDER — ALBUMIN HUMAN 5 % IV SOLN
INTRAVENOUS | Status: DC | PRN
  Administered 2018-07-29 (×3): via INTRAVENOUS

## 2018-07-29 MED ORDER — ORAL CARE MOUTH RINSE
15.0000 mL | Freq: Two times a day (BID) | OROMUCOSAL | Status: DC
  Administered 2018-07-29 – 2018-08-02 (×7): 15 mL via OROMUCOSAL

## 2018-07-29 MED ORDER — MIDAZOLAM HCL 2 MG/2ML IJ SOLN: 2.0000 mg | INTRAMUSCULAR | Status: DC | PRN

## 2018-07-29 MED ORDER — DOCUSATE SODIUM 100 MG PO CAPS
200.0000 mg | ORAL_CAPSULE | Freq: Every day | ORAL | Status: DC
  Administered 2018-07-30 – 2018-08-01 (×3): 200 mg via ORAL
  Filled 2018-07-29 (×4): qty 2

## 2018-07-29 MED ORDER — LIDOCAINE 2% (20 MG/ML) 5 ML SYRINGE
INTRAMUSCULAR | Status: AC
  Filled 2018-07-29: qty 5

## 2018-07-29 MED ORDER — ACETAMINOPHEN 650 MG RE SUPP
650.0000 mg | Freq: Once | RECTAL | Status: AC
  Administered 2018-07-29: 650 mg via RECTAL

## 2018-07-29 MED ORDER — SODIUM CHLORIDE 0.9% FLUSH: 3.0000 mL | INTRAVENOUS | Status: DC | PRN

## 2018-07-29 MED ORDER — PROPOFOL 10 MG/ML IV BOLUS
INTRAVENOUS | Status: DC | PRN
  Administered 2018-07-29: 70 mg via INTRAVENOUS

## 2018-07-29 MED ORDER — SODIUM CHLORIDE 0.9 % IV SOLN
INTRAVENOUS | Status: DC | PRN
Start: 2018-07-29 — End: 2018-07-29
  Administered 2018-07-29: 12:00:00 via INTRAVENOUS

## 2018-07-29 MED ORDER — VANCOMYCIN HCL IN DEXTROSE 1-5 GM/200ML-% IV SOLN
1000.0000 mg | Freq: Once | INTRAVENOUS | Status: AC
  Administered 2018-07-29: 1000 mg via INTRAVENOUS
  Filled 2018-07-29: qty 200

## 2018-07-29 MED ORDER — POTASSIUM CHLORIDE 10 MEQ/50ML IV SOLN
10.0000 meq | INTRAVENOUS | Status: AC
  Administered 2018-07-29 (×3): 10 meq via INTRAVENOUS

## 2018-07-29 MED ORDER — CHLORHEXIDINE GLUCONATE 4 % EX LIQD: 30.0000 mL | CUTANEOUS | Status: DC

## 2018-07-29 MED ORDER — INSULIN REGULAR(HUMAN) IN NACL 100-0.9 UT/100ML-% IV SOLN: INTRAVENOUS | Status: DC

## 2018-07-29 MED ORDER — ONDANSETRON HCL 4 MG/2ML IJ SOLN
4.0000 mg | Freq: Four times a day (QID) | INTRAMUSCULAR | Status: DC | PRN
  Administered 2018-07-29 – 2018-07-31 (×9): 4 mg via INTRAVENOUS
  Filled 2018-07-29 (×10): qty 2

## 2018-07-29 MED ORDER — HEPARIN SODIUM (PORCINE) 1000 UNIT/ML IJ SOLN
INTRAMUSCULAR | Status: AC
  Filled 2018-07-29: qty 1

## 2018-07-29 MED ORDER — 0.9 % SODIUM CHLORIDE (POUR BTL) OPTIME
TOPICAL | Status: DC | PRN
  Administered 2018-07-29: 5000 mL

## 2018-07-29 MED ORDER — SODIUM CHLORIDE 0.9% FLUSH
3.0000 mL | Freq: Two times a day (BID) | INTRAVENOUS | Status: DC
  Administered 2018-07-30 – 2018-08-01 (×5): 3 mL via INTRAVENOUS
  Administered 2018-08-01: 11:00:00 8 mL via INTRAVENOUS
  Administered 2018-08-02: 3 mL via INTRAVENOUS

## 2018-07-29 MED ORDER — PROTAMINE SULFATE 10 MG/ML IV SOLN
INTRAVENOUS | Status: AC
  Filled 2018-07-29: qty 50

## 2018-07-29 MED ORDER — ACETAMINOPHEN 160 MG/5ML PO SOLN: 650.0000 mg | Freq: Once | ORAL | Status: AC

## 2018-07-29 MED ORDER — CHLORHEXIDINE GLUCONATE 0.12 % MT SOLN
OROMUCOSAL | Status: AC
  Administered 2018-07-29: 15 mL via OROMUCOSAL
  Filled 2018-07-29: qty 15

## 2018-07-29 MED ORDER — TRAMADOL HCL 50 MG PO TABS
50.0000 mg | ORAL_TABLET | ORAL | Status: DC | PRN
  Administered 2018-07-29 – 2018-08-01 (×6): 100 mg via ORAL
  Filled 2018-07-29 (×7): qty 2

## 2018-07-29 MED ORDER — ASPIRIN 81 MG PO CHEW: 324.0000 mg | CHEWABLE_TABLET | Freq: Every day | ORAL | Status: DC

## 2018-07-29 MED ORDER — DEXMEDETOMIDINE HCL IN NACL 400 MCG/100ML IV SOLN: 0.0000 ug/kg/h | INTRAVENOUS | Status: DC

## 2018-07-29 MED ORDER — MAGNESIUM SULFATE 4 GM/100ML IV SOLN
4.0000 g | Freq: Once | INTRAVENOUS | Status: AC
  Administered 2018-07-29: 4 g via INTRAVENOUS
  Filled 2018-07-29: qty 100

## 2018-07-29 MED ORDER — ALBUMIN HUMAN 5 % IV SOLN
250.0000 mL | INTRAVENOUS | Status: AC | PRN
  Administered 2018-07-29 (×4): 12.5 g via INTRAVENOUS
  Filled 2018-07-29 (×2): qty 250

## 2018-07-29 MED ORDER — CHLORHEXIDINE GLUCONATE 0.12 % MT SOLN
15.0000 mL | OROMUCOSAL | Status: AC
  Administered 2018-07-29: 15 mL via OROMUCOSAL
  Filled 2018-07-29: qty 15

## 2018-07-29 MED ORDER — FAMOTIDINE IN NACL 20-0.9 MG/50ML-% IV SOLN
20.0000 mg | Freq: Two times a day (BID) | INTRAVENOUS | Status: DC
  Administered 2018-07-29: 20 mg via INTRAVENOUS

## 2018-07-29 MED ORDER — FENTANYL CITRATE (PF) 250 MCG/5ML IJ SOLN
INTRAMUSCULAR | Status: DC | PRN
  Administered 2018-07-29: 100 ug via INTRAVENOUS
  Administered 2018-07-29: 250 ug via INTRAVENOUS
  Administered 2018-07-29 (×5): 100 ug via INTRAVENOUS
  Administered 2018-07-29: 50 ug via INTRAVENOUS

## 2018-07-29 MED ORDER — FENTANYL CITRATE (PF) 100 MCG/2ML IJ SOLN
50.0000 ug | INTRAMUSCULAR | Status: DC | PRN
  Administered 2018-07-29: 25 ug via INTRAVENOUS
  Administered 2018-07-29 (×2): 50 ug via INTRAVENOUS
  Administered 2018-07-29: 25 ug via INTRAVENOUS
  Administered 2018-07-30 (×2): 100 ug via INTRAVENOUS
  Administered 2018-07-30: 50 ug via INTRAVENOUS
  Administered 2018-07-30: 100 ug via INTRAVENOUS
  Administered 2018-07-30 (×2): 50 ug via INTRAVENOUS
  Administered 2018-07-31 (×3): 100 ug via INTRAVENOUS
  Filled 2018-07-29 (×12): qty 2

## 2018-07-29 MED ORDER — LACTATED RINGERS IV SOLN
500.0000 mL | Freq: Once | INTRAVENOUS | Status: AC | PRN
  Administered 2018-07-30: 500 mL via INTRAVENOUS

## 2018-07-29 MED ORDER — PHENYLEPHRINE 40 MCG/ML (10ML) SYRINGE FOR IV PUSH (FOR BLOOD PRESSURE SUPPORT)
PREFILLED_SYRINGE | INTRAVENOUS | Status: AC
  Filled 2018-07-29: qty 10

## 2018-07-29 SURGICAL SUPPLY — 86 items
BAG DECANTER FOR FLEXI CONT (MISCELLANEOUS) ×8 IMPLANT
BANDAGE ELASTIC 4 VELCRO ST LF (GAUZE/BANDAGES/DRESSINGS) ×4 IMPLANT
BANDAGE ELASTIC 6 VELCRO ST LF (GAUZE/BANDAGES/DRESSINGS) ×4 IMPLANT
BASKET HEART (ORDER IN 25'S) (MISCELLANEOUS) ×1
BASKET HEART (ORDER IN 25S) (MISCELLANEOUS) ×3 IMPLANT
BLADE CLIPPER SURG (BLADE) IMPLANT
BLADE STERNUM SYSTEM 6 (BLADE) ×4 IMPLANT
BNDG ELASTIC 4X5.8 VLCR STR LF (GAUZE/BANDAGES/DRESSINGS) ×4 IMPLANT
BNDG ELASTIC 6X5.8 VLCR STR LF (GAUZE/BANDAGES/DRESSINGS) ×4 IMPLANT
BNDG GAUZE ELAST 4 BULKY (GAUZE/BANDAGES/DRESSINGS) ×4 IMPLANT
CANISTER SUCT 3000ML PPV (MISCELLANEOUS) ×4 IMPLANT
CANNULA EZ GLIDE AORTIC 21FR (CANNULA) ×8 IMPLANT
CATH CPB KIT HENDRICKSON (MISCELLANEOUS) ×4 IMPLANT
CLIP RETRACTION 3.0MM CORONARY (MISCELLANEOUS) ×4 IMPLANT
CLIP VESOCCLUDE MED 24/CT (CLIP) IMPLANT
CLIP VESOCCLUDE SM WIDE 24/CT (CLIP) IMPLANT
COVER WAND RF STERILE (DRAPES) ×4 IMPLANT
DRAIN CHANNEL 19F RND (DRAIN) IMPLANT
DRAPE CARDIOVASCULAR INCISE (DRAPES) ×1
DRAPE INCISE IOBAN 66X45 STRL (DRAPES) ×4 IMPLANT
DRAPE SLUSH/WARMER DISC (DRAPES) ×4 IMPLANT
DRAPE SRG 135X102X78XABS (DRAPES) ×3 IMPLANT
DRSG AQUACEL AG ADV 3.5X14 (GAUZE/BANDAGES/DRESSINGS) ×4 IMPLANT
DRSG COVADERM 4X14 (GAUZE/BANDAGES/DRESSINGS) ×4 IMPLANT
ELECT REM PT RETURN 9FT ADLT (ELECTROSURGICAL) ×8
ELECTRODE REM PT RTRN 9FT ADLT (ELECTROSURGICAL) ×6 IMPLANT
FELT TEFLON 1X6 (MISCELLANEOUS) ×8 IMPLANT
GAUZE SPONGE 4X4 12PLY STRL (GAUZE/BANDAGES/DRESSINGS) ×12 IMPLANT
GLOVE BIO SURGEON STRL SZ7 (GLOVE) ×8 IMPLANT
GLOVE BIOGEL PI IND STRL 7.5 (GLOVE) ×6 IMPLANT
GLOVE BIOGEL PI INDICATOR 7.5 (GLOVE) ×2
GOWN STRL REUS W/ TWL LRG LVL3 (GOWN DISPOSABLE) ×12 IMPLANT
GOWN STRL REUS W/ TWL XL LVL3 (GOWN DISPOSABLE) ×6 IMPLANT
GOWN STRL REUS W/TWL LRG LVL3 (GOWN DISPOSABLE) ×4
GOWN STRL REUS W/TWL XL LVL3 (GOWN DISPOSABLE) ×2
HEMOSTAT POWDER SURGIFOAM 1G (HEMOSTASIS) ×12 IMPLANT
KIT BASIN OR (CUSTOM PROCEDURE TRAY) ×4 IMPLANT
KIT SUCTION CATH 14FR (SUCTIONS) ×12 IMPLANT
KIT TURNOVER KIT B (KITS) ×4 IMPLANT
KIT VASOVIEW HEMOPRO 2 VH 4000 (KITS) ×4 IMPLANT
LEAD PACING MYOCARDI (MISCELLANEOUS) ×4 IMPLANT
MARKER GRAFT CORONARY BYPASS (MISCELLANEOUS) ×12 IMPLANT
NS IRRIG 1000ML POUR BTL (IV SOLUTION) ×20 IMPLANT
PACK E OPEN HEART (SUTURE) ×4 IMPLANT
PACK OPEN HEART (CUSTOM PROCEDURE TRAY) ×4 IMPLANT
PAD ARMBOARD 7.5X6 YLW CONV (MISCELLANEOUS) ×8 IMPLANT
PAD ELECT DEFIB RADIOL ZOLL (MISCELLANEOUS) ×4 IMPLANT
PENCIL BUTTON HOLSTER BLD 10FT (ELECTRODE) ×4 IMPLANT
POSITIONER HEAD DONUT 9IN (MISCELLANEOUS) ×4 IMPLANT
PUNCH AORTIC ROTATE 4.0MM (MISCELLANEOUS) ×4 IMPLANT
PUNCH AORTIC ROTATE 4.5MM 8IN (MISCELLANEOUS) IMPLANT
PUNCH AORTIC ROTATE 5MM 8IN (MISCELLANEOUS) IMPLANT
SET CARDIOPLEGIA MPS 5001102 (MISCELLANEOUS) ×4 IMPLANT
SPONGE LAP 18X18 RF (DISPOSABLE) IMPLANT
SPONGE LAP 4X18 RFD (DISPOSABLE) IMPLANT
SUT BONE WAX W31G (SUTURE) ×4 IMPLANT
SUT ETHIBOND X763 2 0 SH 1 (SUTURE) ×8 IMPLANT
SUT MNCRL AB 3-0 PS2 18 (SUTURE) ×8 IMPLANT
SUT PDS AB 1 CTX 36 (SUTURE) ×8 IMPLANT
SUT PROLENE 2 0 SH DA (SUTURE) IMPLANT
SUT PROLENE 3 0 SH DA (SUTURE) ×4 IMPLANT
SUT PROLENE 3 0 SH1 36 (SUTURE) IMPLANT
SUT PROLENE 4 0 RB 1 (SUTURE) ×3
SUT PROLENE 4 0 SH DA (SUTURE) ×4 IMPLANT
SUT PROLENE 4-0 RB1 .5 CRCL 36 (SUTURE) ×9 IMPLANT
SUT PROLENE 5 0 C 1 36 (SUTURE) ×12 IMPLANT
SUT PROLENE 6 0 C 1 30 (SUTURE) IMPLANT
SUT PROLENE 7 0 BV 1 (SUTURE) ×4 IMPLANT
SUT PROLENE 8 0 BV175 6 (SUTURE) IMPLANT
SUT PROLENE BLUE 7 0 (SUTURE) ×4 IMPLANT
SUT PROLENE POLY MONO (SUTURE) IMPLANT
SUT STEEL 6MS V (SUTURE) ×8 IMPLANT
SUT VIC AB 1 CTX 36 (SUTURE) ×1
SUT VIC AB 1 CTX36XBRD ANBCTR (SUTURE) ×3 IMPLANT
SUT VIC AB 2-0 CT1 27 (SUTURE) ×1
SUT VIC AB 2-0 CT1 TAPERPNT 27 (SUTURE) ×3 IMPLANT
SUT VIC AB 3-0 X1 27 (SUTURE) ×8 IMPLANT
SYSTEM SAHARA CHEST DRAIN ATS (WOUND CARE) ×4 IMPLANT
TAPE CLOTH SURG 4X10 WHT LF (GAUZE/BANDAGES/DRESSINGS) ×8 IMPLANT
TAPE PAPER 3X10 WHT MICROPORE (GAUZE/BANDAGES/DRESSINGS) ×4 IMPLANT
TOWEL GREEN STERILE (TOWEL DISPOSABLE) ×4 IMPLANT
TOWEL GREEN STERILE FF (TOWEL DISPOSABLE) ×4 IMPLANT
TRAY FOLEY SLVR 16FR TEMP STAT (SET/KITS/TRAYS/PACK) ×4 IMPLANT
TUBING LAP HI FLOW INSUFFLATIO (TUBING) ×4 IMPLANT
UNDERPAD 30X30 (UNDERPADS AND DIAPERS) ×4 IMPLANT
WATER STERILE IRR 1000ML POUR (IV SOLUTION) ×8 IMPLANT

## 2018-07-29 NOTE — Anesthesia Procedure Notes (Signed)
Procedure Name: Intubation Date/Time: 07/29/2018 8:24 AM Performed by: Lance Coon, CRNA Pre-anesthesia Checklist: Patient identified, Emergency Drugs available, Suction available, Patient being monitored and Timeout performed Patient Re-evaluated:Patient Re-evaluated prior to induction Oxygen Delivery Method: Circle system utilized Preoxygenation: Pre-oxygenation with 100% oxygen Induction Type: IV induction Ventilation: Mask ventilation without difficulty Laryngoscope Size: Miller and 3 Grade View: Grade I Tube type: Oral Tube size: 7.5 mm Number of attempts: 1 Airway Equipment and Method: Stylet Placement Confirmation: ETT inserted through vocal cords under direct vision,  positive ETCO2 and breath sounds checked- equal and bilateral Secured at: 21 cm Tube secured with: Tape Dental Injury: Teeth and Oropharynx as per pre-operative assessment

## 2018-07-29 NOTE — Transfer of Care (Signed)
Immediate Anesthesia Transfer of Care Note  Patient: Tom Phelps  Procedure(s) Performed: CORONARY ARTERY BYPASS GRAFTING (CABG) x 2 (N/A Chest) TRANSESOPHAGEAL ECHOCARDIOGRAM (TEE) (N/A ) Endovein Harvest Of Greater Saphenous Vein Left Thigh; Exploration only of right leg greater saphenous vein (Left Thigh)  Patient Location: SICU  Anesthesia Type:General  Level of Consciousness: obtunded and Patient remains intubated per anesthesia plan  Airway & Oxygen Therapy: Patient remains intubated per anesthesia plan and Patient placed on Ventilator (see vital sign flow sheet for setting)  Post-op Assessment: Report given to RN and Post -op Vital signs reviewed and stable  Post vital signs: Reviewed and stable  Last Vitals:  Vitals Value Taken Time  BP 90/58 07/29/18 1300  Temp    Pulse 79 07/29/18 1305  Resp 12 07/29/18 1305  SpO2 100 % 07/29/18 1305  Vitals shown include unvalidated device data.  Last Pain:  Vitals:   07/29/18 0714  TempSrc:   PainSc: 0-No pain         Complications: No apparent anesthesia complications

## 2018-07-29 NOTE — Procedures (Signed)
Extubation Procedure Note  Patient Details:   Name: Tom Phelps DOB: 1960/05/22 MRN: [id number]   Airway Documentation:    Vent end date: 07/29/18 Vent end time: 1748   Evaluation  O2 sats: stable throughout Complications: No apparent complications Patient did tolerate procedure well. Bilateral Breath Sounds: Clear   Yes   Pt extubated to 4 l/m nasal cannula without difficulty per protocol.    Earney Navy 07/29/2018, 5:49 PM

## 2018-07-29 NOTE — Anesthesia Procedure Notes (Signed)
Arterial Line Insertion Start/End7/23/2020 7:15 AM, 07/29/2018 7:25 AM Performed by: Lance Coon, CRNA, CRNA  Patient location: Pre-op. Preanesthetic checklist: patient identified, IV checked, site marked, risks and benefits discussed, surgical consent, monitors and equipment checked, pre-op evaluation, timeout performed and anesthesia consent Lidocaine 1% used for infiltration and patient sedated Right, radial was placed Catheter size: 20 G Hand hygiene performed , maximum sterile barriers used  and Seldinger technique used  Attempts: 1 Procedure performed without using ultrasound guided technique. Following insertion, dressing applied and Biopatch. Post procedure assessment: normal and unchanged  Patient tolerated the procedure well with no immediate complications.

## 2018-07-29 NOTE — Progress Notes (Signed)
CT Surgery PM Rounds  S/p CABG today Extubated on side of bed NSR hemodynamics stable C/o nausea despite zofran- short dose of reglan

## 2018-07-29 NOTE — Anesthesia Postprocedure Evaluation (Signed)
Anesthesia Post Note  Patient: Tom Phelps  Procedure(s) Performed: CORONARY ARTERY BYPASS GRAFTING (CABG) x 2 (N/A Chest) TRANSESOPHAGEAL ECHOCARDIOGRAM (TEE) (N/A ) Endovein Harvest Of Greater Saphenous Vein Left Thigh; Exploration only of right leg greater saphenous vein (Left Thigh)     Patient location during evaluation: PACU Anesthesia Type: General Level of consciousness: awake and alert Pain management: pain level controlled Vital Signs Assessment: post-procedure vital signs reviewed and stable Respiratory status: spontaneous breathing, nonlabored ventilation, respiratory function stable and patient connected to nasal cannula oxygen Cardiovascular status: blood pressure returned to baseline and stable Postop Assessment: no apparent nausea or vomiting Anesthetic complications: no    Last Vitals:  Vitals:   07/29/18 1345 07/29/18 1400  BP:    Pulse: 76 71  Resp: 12 12  Temp: (!) 35.5 C (!) 35.5 C  SpO2: 99% 100%    Last Pain:  Vitals:   07/29/18 0714  TempSrc:   PainSc: 0-No pain                 Ethelle Ola S

## 2018-07-29 NOTE — OR Nursing (Signed)
Placed rectal temperature probe for monitoring during CABG procedure. Light bleeding from rectum noticed while prepping.

## 2018-07-29 NOTE — Brief Op Note (Signed)
07/29/2018  7:52 AM  PATIENT:  Tom Phelps  58 y.o. male  PRE-OPERATIVE DIAGNOSIS:  CAD  POST-OPERATIVE DIAGNOSIS:  Coronary Artery Disease   PROCEDURE:  Procedure(s): CORONARY ARTERY BYPASS GRAFTING (CABG) x 2 (N/A) TRANSESOPHAGEAL ECHOCARDIOGRAM (TEE) (N/A) Endovein Harvest Of Greater Saphenous Vein Left Thigh; Exploration only of right leg greater saphenous vein (Left) LIMA-LAD SVG-OM  SURGEON:  Surgeon(s) and Role:    * Lightfoot, Lucile Crater, MD - Primary  PHYSICIAN ASSISTANT: Wandell Scullion PA-C  ANESTHESIA:   general  EBL:  1100 mL   BLOOD ADMINISTERED:none  DRAINS: ROUTINE LEFT  PLEURAL AND PERICARDIAL CHEST TUBES   LOCAL MEDICATIONS USED:  NONE  SPECIMEN:  No Specimen  DISPOSITION OF SPECIMEN:  N/A  COUNTS:  YES  TOURNIQUET:  * No tourniquets in log *  DICTATION: .Other Dictation: Dictation Number [id number]  PLAN OF CARE: Admit to inpatient   PATIENT DISPOSITION:  ICU - intubated and hemodynamically stable.   Delay start of Pharmacological VTE agent (>24hrs) due to surgical blood loss or risk of bleeding: yes

## 2018-07-29 NOTE — Anesthesia Procedure Notes (Signed)
Central Venous Catheter Insertion Performed by: Roderic Palau, MD, anesthesiologist Start/End7/23/2020 7:15 AM, 07/29/2018 7:30 AM Patient location: Pre-op. Preanesthetic checklist: patient identified, IV checked, site marked, risks and benefits discussed, surgical consent, monitors and equipment checked, pre-op evaluation, timeout performed and anesthesia consent Position: Trendelenburg Lidocaine 1% used for infiltration and patient sedated Hand hygiene performed , maximum sterile barriers used  and Seldinger technique used Catheter size: 9 Fr Total catheter length 10. Central line was placed.MAC introducer Swan type:thermodilution Procedure performed using ultrasound guided technique. Ultrasound Notes:anatomy identified, needle tip was noted to be adjacent to the nerve/plexus identified, no ultrasound evidence of intravascular and/or intraneural injection and image(s) printed for medical record Attempts: 1 Following insertion, line sutured, dressing applied and Biopatch. Post procedure assessment: blood return through all ports, free fluid flow and no air  Patient tolerated the procedure well with no immediate complications.

## 2018-07-29 NOTE — Anesthesia Procedure Notes (Signed)
Central Venous Catheter Insertion Performed by: Roderic Palau, MD, anesthesiologist Start/End7/23/2020 7:15 AM, 07/29/2018 7:30 AM Patient location: Pre-op. Preanesthetic checklist: patient identified, IV checked, site marked, risks and benefits discussed, surgical consent, monitors and equipment checked, pre-op evaluation, timeout performed and anesthesia consent Hand hygiene performed  and maximum sterile barriers used  PA cath was placed.Swan type:thermodilution PA Cath depth:50 Procedure performed without using ultrasound guided technique. Attempts: 1 Patient tolerated the procedure well with no immediate complications.

## 2018-07-29 NOTE — Progress Notes (Signed)
  Echocardiogram Echocardiogram Transesophageal has been performed.  Jannett Celestine 07/29/2018, 9:48 AM

## 2018-07-29 NOTE — Progress Notes (Signed)
[address]       [place] [address]             [phone]                                                   [facility] Medical Record #[id number] Date of Birth: 09-Jun-1960  Referring: Corky Crafts, MD Primary Care: Mila Palmer, MD Primary Cardiologist: No primary care provider on file.  Chief Complaint:   No chief complaint on file.   History of Present Illness:    Tom Phelps 58 y.o. male presents today for surgery.  There have been no new changes since clinic  Per my previous note.  He was seen for evaluation of 2V CAD.  He has been followed with coronary CT scans, and has had worsening exertional angina over the past several months.  He underwent a LHC on 7/14 which revealed 2 vessel disease.  He denies any orthopnea, PND, or peripheral edema.  He does have anginal symptoms now at short distances.   Stopped plavix on 7/14  Current Activity/ Functional Status:  Patient is independent with mobility/ambulation, transfers, ADL's, IADL's.   Zubrod Score: At the time of surgery this patients most appropriate activity status/level should be described as: ?    0    Normal activity, no symptoms ?    1    Restricted in physical strenuous activity but ambulatory, able to do out light work ?    2    Ambulatory and capable of self care, unable to do work activities, up and about               >50 % of waking hours                              ?    3    Only limited self care, in bed greater than 50% of waking hours ?    4    Completely disabled, no self care, confined to bed or chair ?    5    Moribund       Past Medical History:  Diagnosis Date   Actinic keratoses    Ankle dislocation    calcaneal tendons   Anxiety    Aortic atherosclerosis (HCC)    Chronic pain of left knee 11/18/2017   Coronary artery disease    DDD (degenerative disc disease), cervical    Diabetes mellitus without complication  (HCC)    2019   Displaced other extraarticular fracture of right calcaneus, initial encounter for closed fracture 07/13/2017   DOE (dyspnea on exertion)    Fatty liver    mild   Fracture of distal end of right fibula    Hypercholesteremia    Hypercholesterolemia    Hypertension    Lightheadedness    Neck pain    Prediabetes    Pulmonary nodules    Stress reaction    TIA (transient ischemic attack)    circulation          Past Surgical History:  Procedure Laterality Date   INTRAVASCULAR PRESSURE WIRE/FFR STUDY N/A 07/20/2018   Procedure: INTRAVASCULAR PRESSURE WIRE/FFR STUDY;  Surgeon: Corky Crafts, MD;  Location: [facility];  Service:  Cardiovascular;  Laterality: N/A;   LEFT HEART CATH AND CORONARY ANGIOGRAPHY N/A 07/20/2018   Procedure: LEFT HEART CATH AND CORONARY ANGIOGRAPHY;  Surgeon: Corky CraftsVaranasi, Jayadeep S, MD;  [facility];  Service: Cardiovascular;  Laterality: N/A;         Family History  Problem Relation Age of Onset   CAD Mother    CAD Father      Social History      Tobacco Use  Smoking Status Never Smoker  Smokeless Tobacco Never Used    Social History       Substance and Sexual Activity  Alcohol Use Never   Frequency: Never          Allergies  Allergen Reactions   Metformin And Related Shortness Of Breath and Other (See Comments)    Pressure on heart   Oxycodone Itching          Current Outpatient Medications  Medication Sig Dispense Refill   atorvastatin (LIPITOR) 80 MG tablet Take 1 tablet (80 mg total) by mouth daily. 30 tablet 11   clopidogrel (PLAVIX) 75 MG tablet Take 75 mg by mouth daily.  12   metoprolol tartrate (LOPRESSOR) 25 MG tablet Take 1 tablet (25 mg total) by mouth 2 (two) times daily. 180 tablet 1   nitroGLYCERIN (NITROSTAT) 0.4 MG SL tablet Place 1 tablet (0.4 mg total) under the tongue every 5 (five) minutes as needed for chest pain. 25  tablet 6   No current facility-administered medications for this visit.     Constitutional: negative Respiratory: positive for dyspnea on exertion Cardiovascular: positive for exertional chest pressure/discomfort Gastrointestinal: negative Musculoskeletal:positive for stiff joints Neurological: negative   Review of Systems:               Cardiac Review of Systems: [Y] = yes  or   [ N ] = no              Chest Pain [    ]           Resting SOB [   ]         Exertional SOB  [  ]     Orthopnea [  ]             Pedal Edema [   ]        Palpitations [  ]            Syncope  [  ]    Presyncope [   ]              General Review of Systems: [Y] = yes [  ]=no Constitional: recent weight change [  ];  Wt loss over the last 3 months [   ] anorexia [  ]; fatigue [  ]; nausea [  ]; night sweats [  ]; fever [  ]; or chills [  ];           Eye : blurred vision [  ]; diplopia [   ]; vision changes [  ];  Amaurosis fugax[  ]; Resp: cough [  ];  wheezing[  ];  hemoptysis[  ]; shortness of breath[  ]; paroxysmal nocturnal dyspnea[  ]; dyspnea on exertion[  ]; or orthopnea[  ];  GI:  gallstones[  ], vomiting[  ];  dysphagia[  ]; melena[  ];  hematochezia [  ]; heartburn[  ];   Hx of  Colonoscopy[  ]; GU: kidney stones [  ];  hematuria[  ];   dysuria [  ];  nocturia[  ];  history of     obstruction [  ]; urinary frequency [  ]             Skin: rash, swelling[  ];, hair loss[  ];  peripheral edema[  ];  or itching[  ]; Musculosketetal: myalgias[  ];  joint swelling[  ];  joint erythema[  ];  joint pain[  ];  back pain[  ];             Heme/Lymph: bruising[  ];  bleeding[  ];  anemia[  ];  Neuro: TIA[  ];  headaches[  ];  stroke[  ];  vertigo[  ];  seizures[  ];   paresthesias[  ];  difficulty walking[  ];             Psych:depression[  ]; anxiety[  ];             Endocrine: diabetes[  ];  thyroid dysfunction[  ];             Immunizations: Flu up to date [  ]; Pneumococcal up to date [  ];              Other:     PHYSICAL EXAMINATION: There were no vitals taken for this visit. General appearance: alert and no distress Neck: no adenopathy, no carotid bruit, supple, symmetrical, trachea midline and thyroid not enlarged, symmetric, no tenderness/mass/nodules Resp: clear to auscultation bilaterally Cardio: bradycardic, no murmur GI: normal findings: soft, non-tender Extremities: extremities normal, atraumatic, no cyanosis or edema Neurologic: Grossly normal  Diagnostic Studies & Laboratory data:     Recent Radiology Findings:    Imaging Results  Ct Coronary Morph W/cta Cor W/score W/ca W/cm &/or Wo/cm  Addendum Date: 07/06/2018   ADDENDUM REPORT: 07/06/2018 16:27 ADDENDUM: Ramus intermedius branch is present, with severe proximal mixed atherosclerotic plaque, 70-99% stenosis. Mild scattered disease in remainder of vessel. Electronically Signed   By: Weston BrassGayatri  Acharya   On: 07/06/2018 16:27   Addendum Date: 07/06/2018   ADDENDUM REPORT: 07/06/2018 16:23 HISTORY: chest pain EXAM: Cardiac/Coronary  CT TECHNIQUE: The patient was scanned on a [employer] scanner. PROTOCOL: A 120 kV prospective scan was triggered in the descending thoracic aorta at 111 HU's. Axial non-contrast 3 mm slices were carried out through the heart. The data set was analyzed on a dedicated work station and scored using the Agatson method. Gantry rotation speed was 250 msecs and collimation was .6 mm. Beta blockade and 0.8 mg of sl NTG was given. The 3D data set was reconstructed in 5% intervals of the 67-82 % of the R-R cycle. Diastolic phases were analyzed on a dedicated work station using MPR, MIP and VRT modes. The patient received 100mL OMNIPAQUE IOHEXOL 350 MG/ML SOLN of contrast. FINDINGS: Coronary calcium score: [name] coronary artery calcium score is 937, which places the patient in the 96th percentile. Coronary arteries: Normal coronary origins.  Right dominance. Right Coronary Artery: Severe proximal  RCA mixed atherosclerotic plaque, 70-99% stenosis. Severe mid RCA with atherosclerotic plaque 70-99% stenosis, with features of positive remodeling (vulnerable plaque characteristic). Moderate distal RCA plaque, 50-69% stenosis. Patent PDA and PL, small caliber, with some proximal disease in PL. Left Main Coronary Artery: Moderate distal left main stenosis, 50-69% stenosis with mixed atherosclerotic plaque and near circumferential calcification. Left Anterior Descending Coronary Artery: Severe mixed atherosclerotic plaque in proximal LAD, 70-99% stenosis. Severe mixed atherosclerotic plaque  in the mid LAD, 70-99% stenosis. Severe mixed atherosclerotic plaque in the distal LAD, 70-99% stenosis. Patent small diagonals. Left Circumflex Artery: Mild mixed atherosclerotic plaque in the proximal circumflex. Likely severe mixed atherosclerotic plaque diffusely in the distal circumflex artery, which is a small caliber vessel. Patent small OM. Aorta: Normal size, 34 mm at the mid ascending aorta (level of the PA bifurcation) measured double oblique. No calcifications. No dissection. Aortic Valve: No calcifications. Other findings: Normal pulmonary vein drainage into the left atrium. Normal left atrial appendage without a thrombus. Normal size of the pulmonary artery. IMPRESSION: 1. Severe CAD, CADRADS = 4. Severe lesions in the LAD, and likely >50% lesions in distal left main coronary artery. Severe proximal RCA lesion. Consider coronary angiography. CT FFR will be performed and reported separately. 2. [name] coronary artery calcium score is 937, which places the patient in the 96th percentile for age and sex matched control. 3. Normal coronary origin with right dominance. Electronically Signed   By: Cherlynn Kaiser   On: 07/06/2018 16:23   Result Date: 07/06/2018 EXAM: OVER-READ INTERPRETATION  CT CHEST The following report is an over-read performed by radiologist Dr. Abigail Miyamoto of [facility], [place] on  07/06/2018. This over-read does not include interpretation of cardiac or coronary anatomy or pathology. The coronary CTA interpretation by the cardiologist is attached. COMPARISON:  Chest CT 04/10/2017 FINDINGS: Vascular: Aortic atherosclerosis. No central pulmonary embolism, on this non-dedicated study. Mediastinum/Nodes: No imaged thoracic adenopathy. Lungs/Pleura: No pleural fluid. Subpleural predominant pulmonary nodules on the order of 4 mm and less are similar, including on image 40/12. Upper Abdomen: Normal imaged portions of the liver, spleen, stomach. Musculoskeletal: No acute osseous abnormality. IMPRESSION: 1.  No acute findings in the imaged extracardiac chest. 2. Scattered subpleural pulmonary nodules are most likely subpleural lymph nodes. These were evaluated on 04/10/2017. Please see recommendations included there. Electronically Signed: By: Abigail Miyamoto M.D. On: 07/06/2018 08:47   Ct Coronary Fractional Flow Reserve Data Prep  Result Date: 07/14/2018 EXAM: CT FFR ANALYSIS CLINICAL DATA:  chest pain FINDINGS: FFRct analysis was performed on the original cardiac CT angiogram dataset. Diagrammatic representation of the FFRct analysis is provided in a separate PDF document in PACS. This dictation was created using the PDF document and an interactive 3D model of the results. 3D model is not available in the EMR/PACS. Normal FFR range is >0.80. 1. Left Main:  No significant stenosis. FFR = 0.95 2. LAD: Proximal FFR = 0.81, Mid FFR =0.59, Distal FFR = 0.56 3. Ramus intermedius: FFR = 0.85 4. LCX: No significant stenosis. Proximal FFR = 0.89, Distal FFR = not well mapped 5. RCA: No significant stenosis. Proximal FFR = 0.96, Mid FFR = 0.85, Distal FFR = 0.82 IMPRESSION: 1.  CT FFR analysis showed severe stenosis in the mid LAD. Consider coronary angiography to further evaluate chest pain. Electronically Signed   By: Cherlynn Kaiser   On: 07/07/2018 17:53   Ct Coronary Fractional Flow Reserve Fluid  Analysis  Result Date: 07/14/2018 EXAM: CT FFR ANALYSIS CLINICAL DATA:  chest pain FINDINGS: FFRct analysis was performed on the original cardiac CT angiogram dataset. Diagrammatic representation of the FFRct analysis is provided in a separate PDF document in PACS. This dictation was created using the PDF document and an interactive 3D model of the results. 3D model is not available in the EMR/PACS. Normal FFR range is >0.80. 1. Left Main:  No significant stenosis. FFR = 0.95 2. LAD: Proximal FFR = 0.81,  Mid FFR =0.59, Distal FFR = 0.56 3. Ramus intermedius: FFR = 0.85 4. LCX: No significant stenosis. Proximal FFR = 0.89, Distal FFR = not well mapped 5. RCA: No significant stenosis. Proximal FFR = 0.96, Mid FFR = 0.85, Distal FFR = 0.82 IMPRESSION: 1.  CT FFR analysis showed severe stenosis in the mid LAD. Consider coronary angiography to further evaluate chest pain. Electronically Signed   By: Weston BrassGayatri  Acharya   On: 07/07/2018 17:53      I have independently reviewed the above radiology studies  and reviewed the findings with the patient.   Recent Lab Findings: CBC Latest Ref Rng & Units 07/28/2018 07/16/2018 04/09/2017  WBC 4.0 - 10.5 K/uL 6.6 6.4 12.3(H)  Hemoglobin 13.0 - 17.0 g/dL 16.114.3 09.613.9 12.8(L)  Hematocrit 39.0 - 52.0 % 42.0 39.8 38.3(L)  Platelets 150 - 400 K/uL 323 240 235   BMP Latest Ref Rng & Units 07/28/2018 07/16/2018 07/06/2018  Glucose 70 - 99 mg/dL 045(W175(H) 098(J122(H) -  BUN 6 - 20 mg/dL 14 15 -  Creatinine 1.910.61 - 1.24 mg/dL 4.781.03 2.951.07 6.210.90  BUN/Creat Ratio 10 - 24 - 14 -  Sodium 135 - 145 mmol/L 137 137 -  Potassium 3.5 - 5.1 mmol/L 5.0 4.3 -  Chloride 98 - 111 mmol/L 108 102 -  CO2 22 - 32 mmol/L 17(L) 20 -  Calcium 8.9 - 10.3 mg/dL 9.1 9.1 -    LHC 3/08/657/14/20  Mid RCA lesion is 50% stenosed. Not significant by DFR: 0.99.  2nd Mrg lesion is 90% stenosed.  Dist LM lesion is 40% stenosed.  Mid LAD-1 lesion is 75% stenosed.  Mid LAD-2 lesion is 75% stenosed.  Ost LAD to Prox  LAD lesion is 70% stenosed.  Prox LAD to Mid LAD lesion is 50% stenosed.  The left ventricular systolic function is normal.  LV end diastolic pressure is normal.  The left ventricular ejection fraction is 55-65% by visual estimate.  There is no aortic valve stenosis.  Echo 06/21/18: 1. The left ventricle has low normal systolic function, with an ejection fraction of 50-55%. The cavity size was normal. There is mildly increased left ventricular wall thickness. Left ventricular diastolic parameters were normal. 2. The right ventricle has normal systolic function. The cavity was normal. There is no increase in right ventricular wall thickness. 3. Mild thickening of the mitral valve leaflet. 4. The aortic valve is tricuspid. Mild thickening of the aortic valve. Mild calcification of the aortic valve.  Assessment / Plan:   [age] male with 2V CAD, without evidence of valvular disease.  Preserved LV and RV function.  Images reviewed.  He has a 40% LM lesion, and tandem lesion in the LAD.  The LAD is suitable for bypass.  There is also a OM2 ostial lesion, but the vessel is quite small.  I will evaluate this at the time of surgery, but if it is a small caliber artery, I will bypass another vessel on the lateral wall given his left main disease.    CABG X2 today        Brendolyn Stockley O Rosa [place]

## 2018-07-29 NOTE — Op Note (Signed)
[address]       [place] [id number]             [phone]       07/29/2018 Patient:  .Tom Phelps Pre-Op Dx: 2 V CAD   Post-op Dx:  same Procedure: CABG X 2.  LIMA LAD.  RVSG to OM2   Endoscopic greater saphenous vein harvest on the left thigh Intra-operative Transesophageal Echocardiogram  Surgeon and Role:      * Raylei Losurdo, Eliezer LoftsHarrell O, MD - Primary    Webb Laws* W. Gold, PA-C - assisting   Anesthesia  general EBL:  1000 ml Blood Administration: none Xclamp Time:  43 min Pump Time:  78 min  Drains: 28 F blake drain:  L, mediastinal  Wires: V wires Counts: correct   Indications: 58 yo male with 2V CAD.  He has been followed with coronary CT scans, and has had worsening exertional angina over the past several months. He underwent a LHC on 7/14 which revealed 2 vessel disease. He denies any orthopnea, PND, or peripheral edema. He does have anginal symptoms now at short distances.   Findings: On review of his LHC, he has a 40% left main, and 90% lesion to a small OM branch.  Good LIMA, good conduit.  Good LAD target.  Very small OM target.  Flow of 20-25 at pressure of 130 on this anastomosis.    Operative Technique: All invasive lines were placed in pre-op holding.  After the risks, benefits and alternatives were thoroughly discussed, the patient was brought to the operative theatre.  Anesthesia was induced, and the patient was prepped and draped in normal sterile fashion.  An appropriate surgical pause was performed, and pre-operative antibiotics were dosed accordingly.  We began with simultaneous incisions were made along the right leg for harvesting of the greater saphenous vein and the chest for the sternotomy.  In regards to the sternotomy, this was carried down with bovie cautery, and the sternum was divided with a reciprocating saw.  Meticulous hemostasis was obtained.  The left internal thoracic artery was exposed and harvested in in pedicled fashion.   The patient was systemically heparinized, and the artery was divided distally, and placed in a papaverine sponge.    The sternal elevator was removed, and a retractor was placed.  The pericardium was divided in the midline and fashioned into a cradle with pericardial stitches.   After we confirmed an appropriate ACT, the ascending aorta was cannulated in standard fashion.  The right atrial appendage was used for venous cannulation site.  Cardiopulmonary bypass was initiated, and the heart retractor was placed. The cross clamp was applied, and a dose of anterograde cardioplegia was given with good arrest of the heart.  Next we exposed the lateral wall, and found a small target on the second marginal.  An end to side anastomosis with the vein graft was then created.  Flows were checked, and they were descent.  Finally, we exposed a good target on the LAD, and fashioned an end to side anastomosis between it and the LITA.  We began to re-warm, and a re-animation dose of cardioplegia was given.  The heart was de-aired, and the cross clamp was removed.  Meticulous hemostasis was obtained.    A partial occludding clamp was then placed on the ascending aorta, and we created an end to side anastomosis between it and the proximal vein graft.  The proximal site was marked with a ring.  Hemostasis was obtained, and we separated from cardiopulmonary bypass without event.the heparin was reversed with protamine.  Chest tubes and wires were placed, and the sternum was re-approximated with with sternal wires.  The soft tissue and skin were re-approximated wth absorbable suture.    The patient tolerated the procedure without any immediate complications, and was transferred to the ICU in guarded condition.  Tom Phelps Tom Phelps

## 2018-07-30 ENCOUNTER — Inpatient Hospital Stay (HOSPITAL_COMMUNITY): Payer: Medicare HMO

## 2018-07-30 ENCOUNTER — Encounter (HOSPITAL_COMMUNITY): Payer: Self-pay | Admitting: Thoracic Surgery (Cardiothoracic Vascular Surgery)

## 2018-07-30 LAB — BASIC METABOLIC PANEL
Anion gap: 9 (ref 5–15)
Anion gap: 10 (ref 5–15)
BUN: 12 mg/dL (ref 6–20)
BUN: 13 mg/dL (ref 6–20)
CO2: 20 mmol/L — ABNORMAL LOW (ref 22–32)
CO2: 23 mmol/L (ref 22–32)
Calcium: 7.8 mg/dL — ABNORMAL LOW (ref 8.9–10.3)
Calcium: 8.3 mg/dL — ABNORMAL LOW (ref 8.9–10.3)
Chloride: 105 mmol/L (ref 98–111)
Chloride: 104 mmol/L (ref 98–111)
Creatinine, Ser: 0.99 mg/dL (ref 0.61–1.24)
Creatinine, Ser: 1.03 mg/dL (ref 0.61–1.24)
GFR calc Af Amer: >60 mL/min (ref >60)
GFR calc Af Amer: >60 mL/min (ref >60)
GFR calc non Af Amer: >60 mL/min (ref >60)
GFR calc non Af Amer: >60 mL/min (ref >60)
Glucose, Bld: 140 mg/dL — ABNORMAL HIGH (ref 70–99)
Glucose, Bld: 146 mg/dL — ABNORMAL HIGH (ref 70–99)
Potassium: 4.2 mmol/L (ref 3.5–5.1)
Potassium: 4 mmol/L (ref 3.5–5.1)
Sodium: 134 mmol/L — ABNORMAL LOW (ref 135–145)
Sodium: 137 mmol/L (ref 135–145)

## 2018-07-30 LAB — CBC
HCT: 24.1 % — ABNORMAL LOW (ref 39.0–52.0)
HCT: 24.6 % — ABNORMAL LOW (ref 39.0–52.0)
Hemoglobin: 8.2 g/dL — ABNORMAL LOW (ref 13.0–17.0)
Hemoglobin: 8.1 g/dL — ABNORMAL LOW (ref 13.0–17.0)
MCH: 30.9 pg (ref 26.0–34.0)
MCH: 30.9 pg (ref 26.0–34.0)
MCHC: 34 g/dL (ref 30.0–36.0)
MCHC: 32.9 g/dL (ref 30.0–36.0)
MCV: 90.9 fL (ref 80.0–100.0)
MCV: 93.9 fL (ref 80.0–100.0)
Platelets: 137 10*3/uL — ABNORMAL LOW (ref 150–400)
Platelets: 123 10*3/uL — ABNORMAL LOW (ref 150–400)
RBC: 2.65 MIL/uL — ABNORMAL LOW (ref 4.22–5.81)
RBC: 2.62 MIL/uL — ABNORMAL LOW (ref 4.22–5.81)
RDW: 13.5 % (ref 11.5–15.5)
RDW: 13.8 % (ref 11.5–15.5)
WBC: 11.5 10*3/uL — ABNORMAL HIGH (ref 4.0–10.5)
WBC: 10.3 10*3/uL (ref 4.0–10.5)
nRBC: 0 % (ref 0.0–0.2)
nRBC: 0 % (ref 0.0–0.2)

## 2018-07-30 LAB — GLUCOSE, CAPILLARY
Glucose-Capillary: 102 mg/dL — ABNORMAL HIGH (ref 70–99)
Glucose-Capillary: 116 mg/dL — ABNORMAL HIGH (ref 70–99)
Glucose-Capillary: 122 mg/dL — ABNORMAL HIGH (ref 70–99)
Glucose-Capillary: 122 mg/dL — ABNORMAL HIGH (ref 70–99)
Glucose-Capillary: 123 mg/dL — ABNORMAL HIGH (ref 70–99)
Glucose-Capillary: 126 mg/dL — ABNORMAL HIGH (ref 70–99)
Glucose-Capillary: 132 mg/dL — ABNORMAL HIGH (ref 70–99)
Glucose-Capillary: 135 mg/dL — ABNORMAL HIGH (ref 70–99)
Glucose-Capillary: 141 mg/dL — ABNORMAL HIGH (ref 70–99)
Glucose-Capillary: 147 mg/dL — ABNORMAL HIGH (ref 70–99)
Glucose-Capillary: 91 mg/dL (ref 70–99)

## 2018-07-30 LAB — MAGNESIUM
Magnesium: 2.2 mg/dL (ref 1.7–2.4)
Magnesium: 2 mg/dL (ref 1.7–2.4)

## 2018-07-30 MED ORDER — INSULIN ASPART 100 UNIT/ML ~~LOC~~ SOLN
0.0000 [IU] | SUBCUTANEOUS | Status: DC
  Administered 2018-07-30 – 2018-07-31 (×3): 2 [IU] via SUBCUTANEOUS
  Administered 2018-07-31: 4 [IU] via SUBCUTANEOUS
  Administered 2018-07-31: 2 [IU] via SUBCUTANEOUS
  Administered 2018-07-31: 4 [IU] via SUBCUTANEOUS
  Administered 2018-08-01 (×3): 2 [IU] via SUBCUTANEOUS

## 2018-07-30 MED ORDER — ENOXAPARIN SODIUM 30 MG/0.3ML ~~LOC~~ SOLN
30.0000 mg | Freq: Every day | SUBCUTANEOUS | Status: DC
  Administered 2018-07-30 – 2018-08-01 (×3): 30 mg via SUBCUTANEOUS
  Filled 2018-07-30 (×3): qty 0.3

## 2018-07-30 NOTE — Discharge Summary (Signed)
Physician Discharge Summary  Patient ID: Tom Phelps MRN: [id number] DOB/AGE: 06-06-1960 58 y.o.  Admit date: 07/29/2018 Discharge date: 08/02/2018  Admission Diagnoses: Severe coronary artery disease  Discharge Diagnoses:  Active Problems:   S/P CABG x 2  Patient Active Problem List   Diagnosis Date Noted  . S/P CABG x 2 07/29/2018  . Angina pectoris ([place])   . Chronic pain of left knee 11/18/2017  . Displaced other extraarticular fracture of right calcaneus, initial encounter for closed fracture 07/13/2017    History of the present illness:  The patient is a 58 year old male who has been followed by cardiology with coronary CT scans who recently has developed worsening exertional angina over the past several months.  Due to this increase in symptomatology he underwent a left heart catheterization on 07/20/2018 which revealed severe two-vessel coronary artery disease.  Due to these findings he was referred to Dr. Kipp Brood in cardiothoracic surgical consultation.  Dr. Kipp Brood evaluated the patient and his studies and agree with recommendations to proceed with coronary artery surgical revascularization.  He was admitted this hospitalization for the procedure.   Discharged Condition: good  Hospital Course: The patient was admitted electively and taken to the operating room on 07/29/2018 at which time he underwent the below described procedure.  He tolerated it well and was taken to the surgical intensive care unit in stable condition.  Postoperative hospital course:  The patient has done well postoperatively.  He was extubated the evening of surgery using standard post cardiac surgical protocols.  He did initially require some low-dose Neo-Synephrine for blood pressure but this was able to be weaned without difficulty.  All routine lines, monitors and drainage devices were discontinued in the standard fashion.  The patient did develop an expected acute blood loss anemia but values stabilized  with most recent hemoglobin hematocrit 7.8 and 23.9 respectively.  The patient had a mild volume overload but responded well to routine diuresis.  Renal function has remained within normal limits.  Most recent BUN and creatinine dated 08/01/2018 was 14 and 0.88 respectively.  Oxygen was weaned and the patient maintains good saturations on room air.  The patient tolerated gradually increasing activities using standard cardiac rehab protocols.  Incisions are noted to be healing well without evidence of infection.  The patient was tolerating diet without difficulty.  At the time of discharge the patient was felt to be quite stable.   Consults: cardiology  Significant Diagnostic Studies: routine postop labs and serial CXR's  Treatments: surgery:   07/29/2018 Patient:  .Tom Phelps Pre-Op Dx: 2 V CAD   Post-op Dx:  same Procedure: CABG X 2.  LIMA LAD.  RVSG to OM2   Endoscopic greater saphenous vein harvest on the left thigh Intra-operative Transesophageal Echocardiogram  Surgeon and Role:      * Lightfoot, Lucile Crater, MD - Primary    Evonnie Pat, PA-C - assisting  Discharge Exam: Blood pressure 108/69, pulse 85, temperature 98.4 F (36.9 C), temperature source Oral, resp. rate (!) 32, height 5\' 10"  (1.778 m), weight 90 kg, SpO2 93 %. Alert NAD RRR EWOB Incision clean  Disposition: Discharge disposition: 01-Home or Self Care      Discharge Instructions    Discharge patient   Complete by: As directed    Discharge disposition: 01-Home or Self Care   Discharge patient date: 08/02/2018     Allergies as of 08/02/2018      Reactions   Metformin And Related Shortness Of Breath, Other (See  Comments)   Pressure on heart   Oxycodone Itching      Medication List    STOP taking these medications   nitroGLYCERIN 0.4 MG SL tablet Commonly known as: NITROSTAT     TAKE these medications   aspirin 325 MG EC tablet Take 1 tablet (325 mg total) by mouth every 6 (six) hours as needed.    atorvastatin 80 MG tablet Commonly known as: Lipitor Take 1 tablet (80 mg total) by mouth daily.   metoprolol tartrate 25 MG tablet Commonly known as: LOPRESSOR Take 1 tablet (25 mg total) by mouth 2 (two) times daily.   traMADol 50 MG tablet Commonly known as: ULTRAM Take 1 tablet (50 mg total) by mouth every 6 (six) hours as needed for moderate pain.      Follow-up Information    Lightfoot, Eliezer LoftsHarrell O, MD Follow up.   Specialty: Thoracic Surgery Why: see discharge paperwork for follow-up appointment.  Please obtain a chest x-ray 1/2-hour prior to this appointment [facility] which is located in  the same office complex on the first floor. Contact information: [address] [place] [address] [phone]        Corky CraftsVaranasi, Jayadeep S, MD Follow up.   Specialties: Cardiology, Radiology, Interventional Cardiology Why: Please see discharge paperwork for follow-up appointment with cardiologist Contact information: [address] [place] [address] [phone]         The patient has been discharged on:   1.Beta Blocker:  Yes [ y  ]                              No   [   ]                              If No, reason:  2.Ace Inhibitor/ARB: Yes [   ]                                     No  [ n   ]                                     If No, reason:  3.Statin:   Yes [place][y  ]                  No  [   ]                  If No, reason:  4.Ecasa:  Yes  [ y  ]                  No   [   ]                  If No, reason:   Signed: Rowe ClackWayne E Milia Warth PA-C 08/02/2018, 7:42 AM

## 2018-07-30 NOTE — Progress Notes (Signed)
[facility] DAILY ICU PROGRESS NOTE                   [address]            [place] [id number]          [phone]   1 Day Post-Op Procedure(s) (LRB): CORONARY ARTERY BYPASS GRAFTING (CABG) x 2 (N/A) TRANSESOPHAGEAL ECHOCARDIOGRAM (TEE) (N/A) Endovein Harvest Of Greater Saphenous Vein Left Thigh; Exploration only of right leg greater saphenous vein (Left)  Total Length of Stay:  LOS: 1 day   Subjective: Feels ok, some incisional discomfort   Objective: Vital signs in last 24 hours: Temp:  [95.5 F (35.3 C)-99.1 F (37.3 C)] 98.8 F (37.1 C) (07/24 0400) Pulse Rate:  [61-82] 81 (07/24 0600) Cardiac Rhythm: Normal sinus rhythm (07/24 0400) Resp:  [11-40] 14 (07/24 0700) BP: (90-122)/(51-72) 103/56 (07/24 0600) SpO2:  [97 %-100 %] 97 % (07/24 0600) Arterial Line BP: (84-141)/(46-71) 115/54 (07/24 0700) FiO2 (%):  [40 %-50 %] 40 % (07/23 1718) Weight:  [90 kg-92.3 kg] 92.3 kg (07/24 0500)  Filed Weights   07/29/18 2109 07/30/18 0500  Weight: 90 kg 92.3 kg    Weight change:    Hemodynamic parameters for last 24 hours: PAP: (16-29)/(8-21) 29/17 CVP:  [0 mmHg-12 mmHg] 4 mmHg PCWP:  [17 mmHg-18 mmHg] 18 mmHg CO:  [3.1 L/min-5.3 L/min] 5.3 L/min CI:  [1.5 L/min/m2-2.5 L/min/m2] 2.5 L/min/m2  Intake/Output from previous day: 07/23 0701 - 07/24 0700 In: 5328 [I.V.:3651.7; Blood:500; IV Piggyback:1176.3] Out: 4626 [Urine:3085; Emesis/NG output:1; Blood:1100; Chest Tube:440]  Intake/Output this shift: No intake/output data recorded.  Current Meds: Scheduled Meds: . acetaminophen  1,000 mg Oral Q6H   Or  . acetaminophen (TYLENOL) oral liquid 160 mg/5 mL  1,000 mg Per Tube Q6H  . aspirin EC  325 mg Oral Daily   Or  . aspirin  324 mg Per Tube Daily  . atorvastatin  80 mg Oral Daily  . bisacodyl  10 mg Oral Daily   Or  . bisacodyl  10 mg Rectal Daily  . Chlorhexidine Gluconate Cloth  6 each Topical Daily  . docusate sodium  200 mg Oral Daily  . insulin  regular  0-10 Units Intravenous TID WC  . mouth rinse  15 mL Mouth Rinse BID  . metoCLOPramide (REGLAN) injection  10 mg Intravenous Q6H  . metoprolol tartrate  12.5 mg Oral BID   Or  . metoprolol tartrate  12.5 mg Per Tube BID  . [START ON 07/31/2018] pantoprazole  40 mg Oral Daily  . sodium chloride flush  3 mL Intravenous Q12H   Continuous Infusions: . sodium chloride    . sodium chloride    . sodium chloride 10 mL/hr at 07/29/18 1315  . cefUROXime (ZINACEF)  IV Stopped (07/29/18 2012)  . dexmedetomidine (PRECEDEX) IV infusion Stopped (07/29/18 1645)  . insulin 1.6 mL/hr at 07/30/18 0700  . lactated ringers    . lactated ringers 20 mL/hr at 07/30/18 0700  . nitroGLYCERIN Stopped (07/29/18 1300)  . phenylephrine (NEO-SYNEPHRINE) Adult infusion 40 mcg/min (07/30/18 0700)   PRN Meds:.sodium chloride, fentaNYL (SUBLIMAZE) injection, metoprolol tartrate, ondansetron (ZOFRAN) IV, sodium chloride flush, traMADol  General appearance: alert, cooperative and no distress Heart: regular rate and rhythm Lungs: slightly dim in bases, o/w clear  Abdomen: soft nontender Extremities: no edema Wound: dressings CDI  Lab Results: CBC: Recent Labs    07/29/18 1919 07/29/18 2116 07/30/18 0410  WBC 11.3*  --  11.5*  HGB 8.4* 8.5* 8.2*  HCT 24.6* 25.0* 24.1*  PLT 114*  --  137*   BMET:  Recent Labs    07/28/18 0843  07/29/18 2116 07/30/18 0410  NA 137   < > 139 134*  K 5.0   < > 4.1 4.2  CL 108  --  105 105  CO2 17*  --   --  20*  GLUCOSE 175*   < > 161* 140*  BUN 14  --  13 12  CREATININE 1.03  --  0.80 0.99  CALCIUM 9.1  --   --  7.8*   < > = values in this interval not displayed.    CMET: Lab Results  Component Value Date   WBC 11.5 (H) 07/30/2018   HGB 8.2 (L) 07/30/2018   HCT 24.1 (L) 07/30/2018   PLT 137 (L) 07/30/2018   GLUCOSE 140 (H) 07/30/2018   ALT 10 07/28/2018   AST 29 07/28/2018   NA 134 (L) 07/30/2018   K 4.2 07/30/2018   CL 105 07/30/2018   CREATININE  0.99 07/30/2018   BUN 12 07/30/2018   CO2 20 (L) 07/30/2018   INR 2.0 (H) 07/29/2018   HGBA1C 6.1 (H) 07/28/2018      PT/INR:  Recent Labs    07/29/18 1319  LABPROT 22.8*  INR 2.0*   Radiology: Dg Chest Port 1 View  Result Date: 07/29/2018 CLINICAL DATA:  Status post CABG. EXAM: PORTABLE CHEST 1 VIEW COMPARISON:  07/28/2018 FINDINGS: Endotracheal tube in good position at the thoracic inlet. NG tube tip in the body of the stomach. Swan-Ganz catheter tip in the right main pulmonary artery. Left chest tube in place. Very tiny left apical pneumothorax. Minimal linear atelectasis on the left. No effusions. IMPRESSION: Tiny left apical pneumothorax. Minimal linear atelectasis on the left. Electronically Signed   By: Lorriane Shire M.D.   On: 07/29/2018 14:05     Assessment/Plan: S/P Procedure(s) (LRB): CORONARY ARTERY BYPASS GRAFTING (CABG) x 2 (N/A) TRANSESOPHAGEAL ECHOCARDIOGRAM (TEE) (N/A) Endovein Harvest Of Greater Saphenous Vein Left Thigh; Exploration only of right leg greater saphenous vein (Left)  1 doing  well POD # 1 CABG x 2  2 hemodyn stable, BP a little low so requiring some Neo- wean as able, CI/CO, PAP - good- should be able to d/c Sganz/aline soon- on lipitor/  3 CT 340 cc post op yesterday, 100 so far today- leave for now 4 sinus rhythm, EKG- unremarkable 5 expected ABL anemia, H/H close to transfusion threshold so will need to monitor closely 6 renal fxn/GFR normal, excellent spont diuresis- monitor as may require some diuretic 7 CXR , minor atx, routine pulm toilet and rehab 8 BS well controlled, no preop DM meds 9 see POD 1 progression orders [facility] 07/30/2018 7:52 AM  Pager [phone]

## 2018-07-30 NOTE — Discharge Instructions (Signed)
Coronary Artery Bypass Grafting, Care After °This sheet gives you information about how to care for yourself after your procedure. Your doctor may also give you more specific instructions. If you have problems or questions, call your doctor. °What can I expect after the procedure? °After the procedure, it is common to: °· Feel sick to your stomach (nauseous). °· Not want to eat as much as normal (lack of appetite). °· Have trouble pooping (constipation). °· Have weakness and tiredness (fatigue). °· Feel sad (depressed) or grouchy (irritable). °· Have pain or discomfort around the cuts from surgery (incisions). °Follow these instructions at home: °Medicines °· Take over-the-counter and prescription medicines only as told by your doctor. Do not stop taking medicines or start any new medicines unless your doctor says it is okay. °· If you were prescribed an antibiotic medicine, take it as told by your doctor. Do not stop taking the antibiotic even if you start to feel better. °Incision care ° °· Follow instructions from your doctor about how to take care of your cuts from surgery. Make sure you: °? Wash your hands with soap and water before and after you change your bandage (dressing). If you cannot use soap and water, use hand sanitizer. °? Change your bandage as told by your doctor. °? Leave stitches (sutures), skin glue, or skin tape (adhesive) strips in place. They may need to stay in place for 2 weeks or longer. If tape strips get loose and curl up, you may trim the loose edges. Do not remove tape strips completely unless your doctor says it is okay. °· Make sure the surgery cuts are clean, dry, and protected. °· Check your cut areas every day for signs of infection. Check for: °? More redness, swelling, or pain. °? More fluid or blood. °? Warmth. °? Pus or a bad smell. °· If cuts were made in your legs: °? Avoid crossing your legs. °? Avoid sitting for long periods of time. Change positions every 30  minutes. °? Raise (elevate) your legs when you are sitting. °Bathing °· Do not take baths, swim, or use a hot tub until your doctor says it is okay. °· You may shower.  Pat the surgery cuts dry. Do not rub the cuts to dry. ° °Endoscopic Saphenous Vein Harvesting, Care After °This sheet gives you information about how to care for yourself after your procedure. Your health care provider may also give you more specific instructions. If you have problems or questions, contact your health care provider. °What can I expect after the procedure? °After the procedure, it is common to have: °· Pain. °· Bruising. °· Swelling. °· Numbness. °Follow these instructions at home: °Incision care ° °· Follow instructions from your health care provider about how to take care of your incisions. Make sure you: °? Wash your hands with soap and water before and after you change your bandages (dressings). If soap and water are not available, use hand sanitizer. °? Change your dressings as told by your health care provider. °? Leave stitches (sutures), skin glue, or adhesive strips in place. These skin closures may need to stay in place for 2 weeks or longer. If adhesive strip edges start to loosen and curl up, you may trim the loose edges. Do not remove adhesive strips completely unless your health care provider tells you to do that. °· Check your incision areas every day for signs of infection. Check for: °? More redness, swelling, or pain. °? Fluid or blood. °? Warmth. °?   Pus or a bad smell. °Medicines °· Take over-the-counter and prescription medicines only as told by your health care provider. °· Ask your health care provider if the medicine prescribed to you requires you to avoid driving or using heavy machinery. °General instructions °· Raise (elevate) your legs above the level of your heart while you are sitting or lying down. °· Avoid crossing your legs. °· Avoid sitting for long periods of time. Change positions every 30  minutes. °· Do any exercises your health care providers have given you. These may include deep breathing, coughing, and walking exercises. °· Do not take baths, swim, or use a hot tub until your health care provider approves. Ask your health care provider if you may take showers. You may only be allowed to take sponge baths. °· Wear compression stockings as told by your health care provider. These stockings help to prevent blood clots and reduce swelling in your legs. °· Keep all follow-up visits as told by your health care provider. This is important. °Contact a health care provider if: °· Medicine does not help your pain. °· Your pain gets worse. °· You have new leg bruises or your leg bruises get bigger. °· Your leg feels numb. °· You have more redness, swelling, or pain around your incision. °· You have fluid or blood coming from your incision. °· Your incision feels warm to the touch. °· You have pus or a bad smell coming from your incision. °· You have a fever. °Get help right away if: °· Your pain is severe. °· You develop pain, tenderness, warmth, redness, or swelling in any part of your leg. °· You have chest pain. °· You have trouble breathing. °Summary °· Raise (elevate) your legs above the level of your heart while you are sitting or lying down. °· Wear compression stockings as told by your health care provider. °· Make sure you know which symptoms should prompt you to contact your health care provider. °· Keep all follow-up visits as told by your health care provider. °This information is not intended to replace advice given to you by your health care provider. Make sure you discuss any questions you have with your health care provider. °Document Released: 09/04/2010 Document Revised: 11/30/2017 Document Reviewed: 11/30/2017 °Elsevier Patient Education © 2020 [employer] ° °Eating and drinking ° °· Eat foods that are high in fiber, such as beans, nuts, whole grains, and raw fruits and vegetables. Any  meats you eat should be lean cut. Avoid canned, processed, and fried foods. This can help prevent trouble pooping. This is also a part of a heart-healthy diet. °· Drink enough fluid to keep your pee (urine) pale yellow. °· Do not drink alcohol until you are fully recovered. Ask your doctor when it is safe to drink alcohol. °Activity °· Rest and limit your activity as told by your doctor. You may be told to: °? Stop any activity right away if you have chest pain, shortness of breath, irregular heartbeats, or dizziness. Get help right away if you have any of these symptoms. °? Move around often for short periods or take short walks as told by your doctor. Slowly increase your activities. °? Avoid lifting, pushing, or pulling anything that is heavier than 10 lb (4.5 kg) for at least 6 weeks or as told by your doctor. °· Do physical therapy or a cardiac rehab (cardiac rehabilitation) program as told by your doctor. °? Physical therapy involves doing exercises to maintain movement and build strength and   endurance. °? A cardiac rehab program includes: °§ Exercise training. °§ Education. °§ Counseling. °· Do not drive until your doctor says it is okay. °· Ask your doctor when you can go back to work. °· Ask your doctor when you can be sexually active. °General instructions °· Do not drive or use heavy machinery while taking prescription pain medicine. °· Do not use any products that contain nicotine or tobacco. These include cigarettes, e-cigarettes, and chewing tobacco. If you need help quitting, ask your doctor. °· Take 2-3 deep breaths every few hours during the day while you get better. This helps expand your lungs and prevent problems. °· If you were given a device called an incentive spirometer, use it several times a day to practice deep breathing. Support your chest with a pillow or your arms when you take deep breaths or cough. °· Wear compression stockings as told by your doctor. °· Weigh yourself every day. This  helps to see if your body is holding (retaining) fluid that may make your heart and lungs work harder. °· Keep all follow-up visits as told by your doctor. This is important. °Contact a doctor if: °· You have more redness, swelling, or pain around any cut. °· You have more fluid or blood coming from any cut. °· Any cut feels warm to the touch. °· You have pus or a bad smell coming from any cut. °· You have a fever. °· You have swelling in your ankles or legs. °· You have pain in your legs. °· You gain 2 lb (0.9 kg) or more a day. °· You feel sick to your stomach or you throw up (vomit). °· You have watery poop (diarrhea). °Get help right away if: °· You have chest pain that goes to your jaw or arms. °· You are short of breath. °· You have a fast or irregular heartbeat. °· You notice a "clicking" in your breastbone (sternum) when you move. °· You have any signs of a stroke. "BE FAST" is an easy way to remember the main warning signs: °? B - Balance. Signs are dizziness, sudden trouble walking, or loss of balance. °? E - Eyes. Signs are trouble seeing or a change in how you see. °? F - Face. Signs are sudden weakness or loss of feeling of the face, or the face or eyelid drooping on one side. °? A - Arms. Signs are weakness or loss of feeling in an arm. This happens suddenly and usually on one side of the body. °? S - Speech. Signs are sudden trouble speaking, slurred speech, or trouble understanding what people say. °? T - Time. Time to call emergency services. Write down what time symptoms started. °· You have other signs of a stroke, such as: °? A sudden, very bad headache with no known cause. °? Feeling sick to your stomach. °? Throwing up. °? Jerky movements you cannot control (seizure). °These symptoms may be an emergency. Do not wait to see if the symptoms will go away. Get medical help right away. Call your local emergency services (911 in the [place] Do not drive yourself to the hospital. °Summary °· After the  procedure, it is common to have pain or discomfort in the cuts from surgery (incisions). °· Do not take baths, swim, or use a hot tub until your doctor says it is okay. °· Slowly increase your activities. You may need physical therapy or cardiac rehab. °· Weigh yourself every day. This helps to see if your   body is holding fluid. °This information is not intended to replace advice given to you by your health care provider. Make sure you discuss any questions you have with your health care provider. °Document Released: 12/28/2012 Document Revised: 09/01/2017 Document Reviewed: 09/01/2017 °Elsevier Patient Education © 2020 [employer]. ° °

## 2018-07-31 ENCOUNTER — Inpatient Hospital Stay (HOSPITAL_COMMUNITY): Payer: Medicare HMO

## 2018-07-31 LAB — GLUCOSE, CAPILLARY
Glucose-Capillary: 102 mg/dL — ABNORMAL HIGH (ref 70–99)
Glucose-Capillary: 112 mg/dL — ABNORMAL HIGH (ref 70–99)
Glucose-Capillary: 113 mg/dL — ABNORMAL HIGH (ref 70–99)
Glucose-Capillary: 116 mg/dL — ABNORMAL HIGH (ref 70–99)
Glucose-Capillary: 116 mg/dL — ABNORMAL HIGH (ref 70–99)
Glucose-Capillary: 122 mg/dL — ABNORMAL HIGH (ref 70–99)
Glucose-Capillary: 131 mg/dL — ABNORMAL HIGH (ref 70–99)
Glucose-Capillary: 137 mg/dL — ABNORMAL HIGH (ref 70–99)
Glucose-Capillary: 143 mg/dL — ABNORMAL HIGH (ref 70–99)
Glucose-Capillary: 171 mg/dL — ABNORMAL HIGH (ref 70–99)
Glucose-Capillary: 181 mg/dL — ABNORMAL HIGH (ref 70–99)
Glucose-Capillary: 87 mg/dL (ref 70–99)
Glucose-Capillary: 92 mg/dL (ref 70–99)

## 2018-07-31 LAB — CBC
HCT: 23.9 % — ABNORMAL LOW (ref 39.0–52.0)
Hemoglobin: 7.8 g/dL — ABNORMAL LOW (ref 13.0–17.0)
MCH: 30.8 pg (ref 26.0–34.0)
MCHC: 32.6 g/dL (ref 30.0–36.0)
MCV: 94.5 fL (ref 80.0–100.0)
Platelets: 129 10*3/uL — ABNORMAL LOW (ref 150–400)
RBC: 2.53 MIL/uL — ABNORMAL LOW (ref 4.22–5.81)
RDW: 13.9 % (ref 11.5–15.5)
WBC: 10.6 10*3/uL — ABNORMAL HIGH (ref 4.0–10.5)
nRBC: 0 % (ref 0.0–0.2)

## 2018-07-31 LAB — BASIC METABOLIC PANEL
Anion gap: 5 (ref 5–15)
BUN: 15 mg/dL (ref 6–20)
CO2: 27 mmol/L (ref 22–32)
Calcium: 8.6 mg/dL — ABNORMAL LOW (ref 8.9–10.3)
Chloride: 103 mmol/L (ref 98–111)
Creatinine, Ser: 0.97 mg/dL (ref 0.61–1.24)
GFR calc Af Amer: >60 mL/min (ref >60)
GFR calc non Af Amer: >60 mL/min (ref >60)
Glucose, Bld: 120 mg/dL — ABNORMAL HIGH (ref 70–99)
Potassium: 4.3 mmol/L (ref 3.5–5.1)
Sodium: 135 mmol/L (ref 135–145)

## 2018-07-31 MED ORDER — METOPROLOL TARTRATE 12.5 MG HALF TABLET
12.5000 mg | ORAL_TABLET | Freq: Three times a day (TID) | ORAL | Status: DC
  Administered 2018-08-01 – 2018-08-02 (×4): 12.5 mg via ORAL
  Filled 2018-07-31 (×4): qty 1

## 2018-07-31 MED ORDER — FUROSEMIDE 10 MG/ML IJ SOLN
20.0000 mg | Freq: Two times a day (BID) | INTRAMUSCULAR | Status: DC
  Administered 2018-07-31 – 2018-08-01 (×4): 20 mg via INTRAVENOUS
  Filled 2018-07-31 (×5): qty 2

## 2018-07-31 MED ORDER — METOPROLOL TARTRATE 25 MG/10 ML ORAL SUSPENSION
12.5000 mg | Freq: Three times a day (TID) | ORAL | Status: DC
  Filled 2018-07-31 (×4): qty 5

## 2018-07-31 NOTE — Progress Notes (Signed)
2 Days Post-Op Procedure(s) (LRB): CORONARY ARTERY BYPASS GRAFTING (CABG) x 2 (N/A) TRANSESOPHAGEAL ECHOCARDIOGRAM (TEE) (N/A) Endovein Harvest Of Greater Saphenous Vein Left Thigh; Exploration only of right leg greater saphenous vein (Left) Subjective: Minor left chest pain on occasion  Objective: Vital signs in last 24 hours: Temp:  [98 F (36.7 C)-98.4 F (36.9 C)] 98 F (36.7 C) (07/25 0400) Pulse Rate:  [66-98] 98 (07/25 0600) Cardiac Rhythm: Normal sinus rhythm (07/25 0000) Resp:  [11-29] 22 (07/25 0600) BP: (100-127)/(56-69) 100/66 (07/25 0500) SpO2:  [93 %-100 %] 95 % (07/25 0600) Arterial Line BP: (122-138)/(48-56) 138/48 (07/24 1200) Weight:  [91.4 kg] 91.4 kg (07/25 0500)  Hemodynamic parameters for last 24 hours: CVP:  [4 mmHg-11 mmHg] 10 mmHg CO:  [6.6 L/min] 6.6 L/min CI:  [3.2 L/min/m2] 3.2 L/min/m2  Intake/Output from previous day: 07/24 0701 - 07/25 0700 In: 1310 [P.O.:600; I.V.:510; IV Piggyback:200] Out: 1890 [Urine:1125; Emesis/NG output:525; Chest Tube:240] Intake/Output this shift: No intake/output data recorded.  General appearance: alert, cooperative and no distress Neurologic: intact Heart: regular rate and rhythm, S1, S2 normal, no murmur, click, rub or gallop Lungs: clear to auscultation bilaterally Wound: dressing dry  Lab Results: Recent Labs    07/30/18 1740 07/31/18 0443  WBC 10.3 10.6*  HGB 8.1* 7.8*  HCT 24.6* 23.9*  PLT 123* 129*   BMET:  Recent Labs    07/30/18 1740 07/31/18 0443  NA 137 135  K 4.0 4.3  CL 104 103  CO2 23 27  GLUCOSE 146* 120*  BUN 13 15  CREATININE 1.03 0.97  CALCIUM 8.3* 8.6*    PT/INR:  Recent Labs    07/29/18 1319  LABPROT 22.8*  INR 2.0*   ABG    Component Value Date/Time   PHART 7.349 (L) 07/29/2018 1907   HCO3 17.7 (L) 07/29/2018 1907   TCO2 20 (L) 07/29/2018 2116   ACIDBASEDEF 7.0 (H) 07/29/2018 1907   O2SAT 99.0 07/29/2018 1907   CBG (last 3)  Recent Labs    07/30/18 2016  07/30/18 2353 07/31/18 0442  GLUCAP 147* 135* 113*    Assessment/Plan: S/P Procedure(s) (LRB): CORONARY ARTERY BYPASS GRAFTING (CABG) x 2 (N/A) TRANSESOPHAGEAL ECHOCARDIOGRAM (TEE) (N/A) Endovein Harvest Of Greater Saphenous Vein Left Thigh; Exploration only of right leg greater saphenous vein (Left) Mobilize Diuresis d/c pacing wires d/c tubes/lines   LOS: 2 days    Wonda Olds 07/31/2018

## 2018-07-31 NOTE — Progress Notes (Signed)
TCTS PM rounds  Stable day though continuing with nausea Able to ambulate; chest tubes removed Continue to monitor in ICU

## 2018-08-01 ENCOUNTER — Inpatient Hospital Stay (HOSPITAL_COMMUNITY): Payer: Medicare HMO

## 2018-08-01 LAB — GLUCOSE, CAPILLARY
Glucose-Capillary: 117 mg/dL — ABNORMAL HIGH (ref 70–99)
Glucose-Capillary: 145 mg/dL — ABNORMAL HIGH (ref 70–99)
Glucose-Capillary: 127 mg/dL — ABNORMAL HIGH (ref 70–99)
Glucose-Capillary: 131 mg/dL — ABNORMAL HIGH (ref 70–99)
Glucose-Capillary: 98 mg/dL (ref 70–99)

## 2018-08-01 LAB — BASIC METABOLIC PANEL
Anion gap: 9 (ref 5–15)
BUN: 14 mg/dL (ref 6–20)
CO2: 28 mmol/L (ref 22–32)
Calcium: 8.6 mg/dL — ABNORMAL LOW (ref 8.9–10.3)
Chloride: 97 mmol/L — ABNORMAL LOW (ref 98–111)
Creatinine, Ser: 0.88 mg/dL (ref 0.61–1.24)
GFR calc Af Amer: >60 mL/min (ref >60)
GFR calc non Af Amer: >60 mL/min (ref >60)
Glucose, Bld: 137 mg/dL — ABNORMAL HIGH (ref 70–99)
Potassium: 3.6 mmol/L (ref 3.5–5.1)
Sodium: 134 mmol/L — ABNORMAL LOW (ref 135–145)

## 2018-08-01 MED ORDER — POTASSIUM CHLORIDE 20 MEQ/15ML (10%) PO SOLN
20.0000 meq | ORAL | Status: AC
  Administered 2018-08-01 (×3): 20 meq via ORAL
  Filled 2018-08-01 (×3): qty 15

## 2018-08-01 MED ORDER — POTASSIUM CHLORIDE CRYS ER 20 MEQ PO TBCR
20.0000 meq | EXTENDED_RELEASE_TABLET | ORAL | Status: DC
  Filled 2018-08-01: qty 1

## 2018-08-01 NOTE — Progress Notes (Signed)
3 Days Post-Op Procedure(s) (LRB): CORONARY ARTERY BYPASS GRAFTING (CABG) x 2 (N/A) TRANSESOPHAGEAL ECHOCARDIOGRAM (TEE) (N/A) Endovein Harvest Of Greater Saphenous Vein Left Thigh; Exploration only of right leg greater saphenous vein (Left) Subjective: Remains nauseated with difficulty swallowing--attributed to the R IJ cordis  Objective: Vital signs in last 24 hours: Temp:  [97.9 F (36.6 C)-99 F (37.2 C)] 98.2 F (36.8 C) (07/26 0841) Pulse Rate:  [70-94] 94 (07/26 0800) Cardiac Rhythm: Normal sinus rhythm (07/26 0800) Resp:  [11-32] 31 (07/26 0903) BP: (82-135)/(65-85) 135/71 (07/26 0903) SpO2:  [75 %-100 %] 93 % (07/26 0800) Weight:  [89.4 kg] 89.4 kg (07/26 0500)  Hemodynamic parameters for last 24 hours:    Intake/Output from previous day: 07/25 0701 - 07/26 0700 In: 1059.9 [P.O.:960; IV Piggyback:99.9] Out: 2625 [TDVVO:1607; Chest Tube:150] Intake/Output this shift: No intake/output data recorded.  General appearance: alert, cooperative and no distress Neurologic: intact Heart: regular rate and rhythm, S1, S2 normal, no murmur, click, rub or gallop Lungs: clear to auscultation bilaterally Wound: c/d/i  Lab Results: Recent Labs    07/30/18 1740 07/31/18 0443  WBC 10.3 10.6*  HGB 8.1* 7.8*  HCT 24.6* 23.9*  PLT 123* 129*   BMET:  Recent Labs    07/31/18 0443 08/01/18 0518  NA 135 134*  K 4.3 3.6  CL 103 97*  CO2 27 28  GLUCOSE 120* 137*  BUN 15 14  CREATININE 0.97 0.88  CALCIUM 8.6* 8.6*    PT/INR:  Recent Labs    07/29/18 1319  LABPROT 22.8*  INR 2.0*   ABG    Component Value Date/Time   PHART 7.349 (L) 07/29/2018 1907   HCO3 17.7 (L) 07/29/2018 1907   TCO2 20 (L) 07/29/2018 2116   ACIDBASEDEF 7.0 (H) 07/29/2018 1907   O2SAT 99.0 07/29/2018 1907   CBG (last 3)  Recent Labs    07/31/18 2024 07/31/18 2344 08/01/18 0336  GLUCAP 116* 116* 117*    Assessment/Plan: S/P Procedure(s) (LRB): CORONARY ARTERY BYPASS GRAFTING (CABG) x  2 (N/A) TRANSESOPHAGEAL ECHOCARDIOGRAM (TEE) (N/A) Endovein Harvest Of Greater Saphenous Vein Left Thigh; Exploration only of right leg greater saphenous vein (Left) Mobilize d/c pacing wires txfer to tele   LOS: 3 days    Wonda Olds 08/01/2018

## 2018-08-01 NOTE — Progress Notes (Signed)
Ventricular pacing wire removed. Bedrest initiated. Q15 vitals commenced.   RN will transfer to 4E after bedrest is complete.   RN will continue to monitor patient.

## 2018-08-01 NOTE — Progress Notes (Signed)
Patient transferred to 4E09 on tele.  Mackayla RN to receive patient at bedside.

## 2018-08-01 NOTE — Progress Notes (Signed)
Pt transferred to 4E-09 via wheelchair with RN. Pt walked to bed. Pt oriented to call bell, bed and room. Call bell within reach. CHG bath given. Tele applied, CCMD notified. VSS. Will continue to monitor.  Amanda Cockayne, RN

## 2018-08-02 LAB — GLUCOSE, CAPILLARY
Glucose-Capillary: 110 mg/dL — ABNORMAL HIGH (ref 70–99)
Glucose-Capillary: 83 mg/dL (ref 70–99)
Glucose-Capillary: 92 mg/dL (ref 70–99)

## 2018-08-02 MED ORDER — FUROSEMIDE 20 MG PO TABS
20.0000 mg | ORAL_TABLET | Freq: Once | ORAL | Status: AC
  Administered 2018-08-02: 20 mg via ORAL
  Filled 2018-08-02: qty 1

## 2018-08-02 MED ORDER — TRAMADOL HCL 50 MG PO TABS: 50.0000 mg | ORAL_TABLET | Freq: Four times a day (QID) | ORAL | 0 refills | Status: DC | PRN

## 2018-08-02 MED ORDER — ASPIRIN 325 MG PO TBEC: 325.0000 mg | DELAYED_RELEASE_TABLET | Freq: Four times a day (QID) | ORAL | Status: DC | PRN

## 2018-08-02 NOTE — Progress Notes (Signed)
     [address]       [place] [address]             [phone]       No events.  Doing well today  Vitals:   08/02/18 0700 08/02/18 0728  BP:  108/69  Pulse:  85  Resp: (!) 31 (!) 32  Temp:  98.4 F (36.9 C)  SpO2:  93%   Alert NAD RRR EWOB Incision clean  CBC Latest Ref Rng & Units 07/31/2018 07/30/2018 07/30/2018  WBC 4.0 - 10.5 K/uL 10.6(H) 10.3 11.5(H)  Hemoglobin 13.0 - 17.0 g/dL 7.8(L) 8.1(L) 8.2(L)  Hematocrit 39.0 - 52.0 % 23.9(L) 24.6(L) 24.1(L)  Platelets 150 - 400 K/uL 129(L) 123(L) 137(L)   BMP Latest Ref Rng & Units 08/01/2018 07/31/2018 07/30/2018  Glucose 70 - 99 mg/dL 137(H) 120(H) 146(H)  BUN 6 - 20 mg/dL 14 15 13   Creatinine 0.61 - 1.24 mg/dL 0.88 0.97 1.03  BUN/Creat Ratio 10 - 24 - - -  Sodium 135 - 145 mmol/L 134(L) 135 137  Potassium 3.5 - 5.1 mmol/L 3.6 4.3 4.0  Chloride 98 - 111 mmol/L 97(L) 103 104  CO2 22 - 32 mmol/L 28 27 23   Calcium 8.9 - 10.3 mg/dL 8.6(L) 8.6(L) 8.3(L)    POD 4 s/p CABG Doing well Home today.

## 2018-08-02 NOTE — H&P (Signed)
[facility] Record #[id number] Date of Birth:05-10-1960  Referring:Varanasi, Charlann Lange, MD Primary Care:Wolters, Ivin Booty, MD Primary Cardiologist:No primary care provider on file.  Chief Complaint:No chief complaint on file.   History of Present Illness: Tom Nami58 y.o.malepresents today for surgery.  There have been no new changes since clinic  Per my previous note.  He was seen for evaluation of 2V CAD. He has been followed with coronary CT scans, and has had worsening exertional angina over the past several months. He underwent a LHC on 7/14 which revealed 2 vessel disease. He denies any orthopnea, PND, or peripheral edema. He does have anginal symptoms now at short distances.  Stopped plavix on 7/14  Current Activity/ Functional Status:  Patientisindependent with mobility/ambulation, transfers, ADL's, IADL's.   Zubrod Score: At the time of surgery this patient's most appropriate activity status/level should be described as: [x] ??0 Normal activity, no symptoms [] ??1 Restricted in physical strenuous activity but ambulatory, able to do out light work [] ??2 Ambulatory and capable of self care, unable to do work activities, up and about >50 % of waking hours  [] ??3 Only limited self care, in bed greater than 50% of waking hours [] ??4 Completely disabled, no self care, confined to bed or chair [] ??5 Moribund       Past Medical History:  Diagnosis Date  . Actinic keratoses   . Ankle dislocation    calcaneal tendons  . Anxiety   . Aortic atherosclerosis ([place])   . Chronic pain of left knee 11/18/2017  . Coronary artery disease   . DDD (degenerative disc disease), cervical   . Diabetes mellitus without complication ([place])    4401  . Displaced other extraarticular fracture of right calcaneus, initial encounter for closed fracture 07/13/2017   . DOE (dyspnea on exertion)   . Fatty liver    mild  . Fracture of distal end of right fibula   . Hypercholesteremia   . Hypercholesterolemia   . Hypertension   . Lightheadedness   . Neck pain   . Prediabetes   . Pulmonary nodules   . Stress reaction   . TIA (transient ischemic attack)    circulation         Past Surgical History:  Procedure Laterality Date  . INTRAVASCULAR PRESSURE WIRE/FFR STUDY N/A 07/20/2018   Procedure: INTRAVASCULAR PRESSURE WIRE/FFR STUDY; Surgeon: Jettie Booze, MD; Location: [facility]; Service: Cardiovascular; Laterality: N/A;  . LEFT HEART CATH AND CORONARY ANGIOGRAPHY N/A 07/20/2018   Procedure: LEFT HEART CATH AND CORONARY ANGIOGRAPHY; Surgeon: Jettie Booze, MD; Location: [facility]; Service: Cardiovascular; Laterality: N/A;         Family History  Problem Relation Age of Onset  . CAD Mother   . CAD Father      Social History      Tobacco Use  Smoking Status Never Smoker  Smokeless Tobacco Never Used   Social History       Substance and Sexual Activity  Alcohol Use Never  . Frequency: Never          Allergies  Allergen Reactions  . Metformin And Related Shortness Of Breath and Other (See Comments)    Pressure on heart  . Oxycodone Itching          Current Outpatient Medications  Medication Sig Dispense Refill  . atorvastatin (LIPITOR) 80 MG tablet Take 1 tablet (80 mg total) by mouth daily. 30 tablet 11  . clopidogrel (PLAVIX) 75 MG tablet Take 75  mg by mouth daily.  12  . metoprolol tartrate (LOPRESSOR) 25 MG tablet Take 1 tablet (25 mg total) by mouth 2 (two) times daily. 180 tablet 1  . nitroGLYCERIN (NITROSTAT) 0.4 MG SL tablet Place 1 tablet (0.4 mg total) under the tongue every 5 (five) minutes as needed for chest pain. 25 tablet 6   No current facility-administered medications for this visit.     Constitutional:negative Respiratory:positive fordyspnea on exertion Cardiovascular:positive forexertional chest pressure/discomfort Gastrointestinal:negative Musculoskeletal:positive forstiff joints Neurological:negative  Review of Systems:   Cardiac Review of Systems: [Y] = yes or [ N ] = no  Chest Pain [ ]  Resting SOB[ ] Exertional SOB[ ]  Orthopnea[ ]  Pedal Edema[ ]  Palpitations[ ] Syncope [ ]  Presyncope[ ]   General Review of Systems: [Y] = yes [ ] =no Constitional:recent weight change [ ] ; Wt loss over the last 3 months [ ]  anorexia[ ] ; fatigue [ ] ;nausea[ ] ; night sweats[ ] ; fever [ ] ; or chills [ ] ;Eye : blurred vision [ ] ;diplopia[ ] ; vision changes [ ] ;Amaurosis fugax[ ] ; Resp: cough[ ] ;wheezing[ ] ;hemoptysis[ ] ;shortness of breath[ ] ; paroxysmal nocturnal dyspnea[ ] ;dyspnea on exertion[ ] ; or orthopnea[ ] ;  YQ:MVHQIONGEX$[id number]$$[id number]:gallstones[ ] , vomiting[ ] ;dysphagia[ ] ;melena[ ] ;hematochezia [ ] ; heartburn[ ] ; Hx of Colonoscopy[ ] ; GU: kidney stones [ ] ; hematuria[ ] ; dysuria[ ] ; nocturia[ ] ; history of obstruction [ ] ; urinary frequency [ ]  Skin: rash, swelling[ ] ;, hair loss[ ] ;peripheral edema[ ] ; or itching[ ] ; Musculosketetal:myalgias[ ] ;joint swelling[ ] ;joint erythema[ ] ; joint pain[ ] ;back pain[ ] ; Heme/Lymph: bruising[ ] ;bleeding[ ] ;anemia[ ] ;  Neuro:TIA[ ] ; headaches[ ] ;stroke[ ] ; vertigo[ ] ; seizures[ ] ; paresthesias[ ] ;difficulty walking[ ] ; Psych:depression[ ] ;anxiety[ ] ; Endocrine:diabetes[ ] ;thyroid dysfunction[ ] ; Immunizations: Flu up to date[ ] ; Pneumococcal up to date [ ] ; Other:     PHYSICAL EXAMINATION: There were no vitals taken for  this visit. General appearance:alert and no distress Neck:no adenopathy, no carotid bruit, supple, symmetrical, trachea midline and thyroid not enlarged, symmetric, no tenderness/mass/nodules Resp:clear to auscultation bilaterally Cardio:bradycardic, no murmur GY:IRSWNIGI:normal findings:soft, non-tender Extremities:extremities normal, atraumatic, no cyanosis or edema Neurologic:Grossly normal  Diagnostic Studies & Laboratory data:  Recent Radiology Findings:   Imaging Results  Ct Coronary Morph W/cta Cor W/score W/ca W/cm &/or Wo/cm  Addendum Date: 07/06/2018  ADDENDUM REPORT: 07/06/2018 16:27 ADDENDUM: Ramus intermedius branch is present, with severe proximal mixed atherosclerotic plaque, 70-99% stenosis. Mild scattered disease in remainder of vessel. Electronically Signed By: Weston BrassGayatri Acharya On: 07/06/2018 16:27   Addendum Date: 07/06/2018  ADDENDUM REPORT: 07/06/2018 16:23 HISTORY: chest pain EXAM: Cardiac/Coronary CT TECHNIQUE: The patient was scanned on a [employer] Force scanner. PROTOCOL: A 120 kV prospective scan was triggered in the descending thoracic aorta at 111 HU's. Axial non-contrast 3 mm slices were carried out through the heart. The data set was analyzed on a dedicated work station and scored using the Agatson method. Gantry rotation speed was 250 msecs and collimation was .6 mm. Beta blockade and 0.8 mg of sl NTG was given. The 3D data set was reconstructed in 5% intervals of the 67-82 % of the R-R cycle. Diastolic phases were analyzed on a dedicated work station using MPR, MIP and VRT modes. The patient received 100mL OMNIPAQUE IOHEXOL 350 MG/ML SOLN of contrast. FINDINGS: Coronary calcium score: [name] coronary artery calcium score is 937, which places the patient in the 96th percentile. Coronary arteries: Normal coronary origins. Right dominance. Right Coronary Artery: Severe proximal RCA mixed atherosclerotic plaque, 70-99% stenosis. Severe mid RCA with  atherosclerotic plaque 70-99% stenosis, with features of positive remodeling (vulnerable plaque characteristic). Moderate distal  RCA plaque, 50-69% stenosis. Patent PDA and PL, small caliber, with some proximal disease in PL. Left Main Coronary Artery: Moderate distal left main stenosis, 50-69% stenosis with mixed atherosclerotic plaque and near circumferential calcification. Left Anterior Descending Coronary Artery: Severe mixed atherosclerotic plaque in proximal LAD, 70-99% stenosis. Severe mixed atherosclerotic plaque in the mid LAD, 70-99% stenosis. Severe mixed atherosclerotic plaque in the distal LAD, 70-99% stenosis. Patent small diagonals. Left Circumflex Artery: Mild mixed atherosclerotic plaque in the proximal circumflex. Likely severe mixed atherosclerotic plaque diffusely in the distal circumflex artery, which is a small caliber vessel. Patent small OM. Aorta: Normal size, 34 mm at the mid ascending aorta (level of the PA bifurcation) measured double oblique. No calcifications. No dissection. Aortic Valve: No calcifications. Other findings: Normal pulmonary vein drainage into the left atrium. Normal left atrial appendage without a thrombus. Normal size of the pulmonary artery. IMPRESSION: 1. Severe CAD, CADRADS = 4. Severe lesions in the LAD, and likely >50% lesions in distal left main coronary artery. Severe proximal RCA lesion. Consider coronary angiography. CT FFR will be performed and reported separately. 2. [name] coronary artery calcium score is 937, which places the patient in the 96th percentile for age and sex matched control. 3. Normal coronary origin with right dominance. Electronically Signed By: Weston BrassGayatri Acharya On: 07/06/2018 16:23   Result Date: 07/06/2018 EXAM: OVER-READ INTERPRETATION CT CHEST The following report is an over-read performed by radiologist Dr. Jeronimo [facility]Kyle Talbot [facility] on 07/06/2018. This over-read does not include interpretation of cardiac or  coronary anatomy or pathology. The coronary CTA interpretation by the cardiologist is attached. COMPARISON: Chest CT 04/10/2017 FINDINGS: Vascular: Aortic atherosclerosis. No central pulmonary embolism, on this non-dedicated study. Mediastinum/Nodes: No imaged thoracic adenopathy. Lungs/Pleura: No pleural fluid. Subpleural predominant pulmonary nodules on the order of 4 mm and less are similar, including on image 40/12. Upper Abdomen: Normal imaged portions of the liver, spleen, stomach. Musculoskeletal: No acute osseous abnormality. IMPRESSION: 1. No acute findings in the imaged extracardiac chest. 2. Scattered subpleural pulmonary nodules are most likely subpleural lymph nodes. These were evaluated on 04/10/2017. Please see recommendations included there. Electronically Signed: By: Jeronimo GreavesKyle Talbot M.D. On: 07/06/2018 08:47   Ct Coronary Fractional Flow Reserve Data Prep  Result Date: 07/14/2018 EXAM: CT FFR ANALYSIS CLINICAL DATA: chest pain FINDINGS: FFRct analysis was performed on the original cardiac CT angiogram dataset. Diagrammatic representation of the FFRct analysis is provided in a separate PDF document in PACS. This dictation was created using the PDF document and an interactive 3D model of the results. 3D model is not available in the EMR/PACS. Normal FFR range is >0.80. 1. Left Main: No significant stenosis. FFR = 0.95 2. LAD: Proximal FFR = 0.81, Mid FFR =0.59, Distal FFR = 0.56 3. Ramus intermedius: FFR = 0.85 4. LCX: No significant stenosis. Proximal FFR = 0.89, Distal FFR = not well mapped 5. RCA: No significant stenosis. Proximal FFR = 0.96, Mid FFR = 0.85, Distal FFR = 0.82 IMPRESSION: 1. CT FFR analysis showed severe stenosis in the mid LAD. Consider coronary angiography to further evaluate chest pain. Electronically Signed By: Weston BrassGayatri Acharya On: 07/07/2018 17:53   Ct Coronary Fractional Flow Reserve Fluid Analysis  Result Date: 07/14/2018 EXAM: CT FFR ANALYSIS CLINICAL DATA:  chest pain FINDINGS: FFRct analysis was performed on the original cardiac CT angiogram dataset. Diagrammatic representation of the FFRct analysis is provided in a separate PDF document in PACS. This dictation was created using the PDF document and an  interactive 3D model of the results. 3D model is not available in the EMR/PACS. Normal FFR range is >0.80. 1. Left Main: No significant stenosis. FFR = 0.95 2. LAD: Proximal FFR = 0.81, Mid FFR =0.59, Distal FFR = 0.56 3. Ramus intermedius: FFR = 0.85 4. LCX: No significant stenosis. Proximal FFR = 0.89, Distal FFR = not well mapped 5. RCA: No significant stenosis. Proximal FFR = 0.96, Mid FFR = 0.85, Distal FFR = 0.82 IMPRESSION: 1. CT FFR analysis showed severe stenosis in the mid LAD. Consider coronary angiography to further evaluate chest pain. Electronically Signed By: Weston BrassGayatri Acharya On: 07/07/2018 17:53     I have independently reviewed the above radiology studies and reviewed the findings with the patient.   Recent Lab Findings: CBC Latest Ref Rng & Units 07/28/2018 07/16/2018 04/09/2017  WBC 4.0 - 10.5 K/uL 6.6 6.4 12.3(H)  Hemoglobin 13.0 - 17.0 g/dL 29.514.3 62.113.9 12.8(L)  Hematocrit 39.0 - 52.0 % 42.0 39.8 38.3(L)  Platelets 150 - 400 K/uL 323 240 235   BMP Latest Ref Rng & Units 07/28/2018 07/16/2018 07/06/2018  Glucose 70 - 99 mg/dL 308(M175(H) 578(I122(H) -  BUN 6 - 20 mg/dL 14 15 -  Creatinine 6.960.61 - 1.24 mg/dL 2.951.03 2.841.07 1.320.90  BUN/Creat Ratio 10 - 24 - 14 -  Sodium 135 - 145 mmol/L 137 137 -  Potassium 3.5 - 5.1 mmol/L 5.0 4.3 -  Chloride 98 - 111 mmol/L 108 102 -  CO2 22 - 32 mmol/L 17(L) 20 -  Calcium 8.9 - 10.3 mg/dL 9.1 9.1 -    LHC 4/40/107/14/20  Mid RCA lesion is 50% stenosed. Not significant by DFR: 0.99.  2nd Mrg lesion is 90% stenosed.  Dist LM lesion is 40% stenosed.  Mid LAD-1 lesion is 75% stenosed.  Mid LAD-2 lesion is 75% stenosed.  Ost LAD to Prox LAD lesion is 70% stenosed.  Prox LAD to Mid LAD lesion is 50%  stenosed.  The left ventricular systolic function is normal.  LV end diastolic pressure is normal.  The left ventricular ejection fraction is 55-65% by visual estimate.  There is no aortic valve stenosis.  Echo 06/21/18: 1. The left ventricle has low normal systolic function, with an ejection fraction of 50-55%. The cavity size was normal. There is mildly increased left ventricular wall thickness. Left ventricular diastolic parameters were normal. 2. The right ventricle has normal systolic function. The cavity was normal. There is no increase in right ventricular wall thickness. 3. Mild thickening of the mitral valve leaflet. 4. The aortic valve is tricuspid. Mild thickening of the aortic valve. Mild calcification of the aortic valve.  Assessment / Plan: [age] male with 2V CAD, without evidence of valvular disease. Preserved LV and RV function. Images reviewed. He has a 40% LM lesion, and tandem lesion in the LAD. The LAD is suitable for bypass. There is also a OM2 ostial lesion, but the vessel is quite small. I will evaluate this at the time of surgery, but if it is a small caliber artery, I will bypass another vessel on the lateral wall given his left main disease.   CABG X2 today     Reianna Batdorf O Laikynn Pollio

## 2018-08-02 NOTE — Progress Notes (Signed)
CARDIAC REHAB PHASE I   PRE:  Rate/Rhythm: 76 SR  BP:  Supine:   Sitting: 108/69  Standing:    SaO2: 94%RA  MODE:  Ambulation: 200 ft   POST:  Rate/Rhythm: 107 ST  BP:  Supine:   Sitting: 123/68  Standing:    SaO2: 100%RA 0802-0900 Pt walked 200 ft on RA with steady gait. Stopped several times due to SOB. Stated hard to breathe with mask on and had walked earlier. Sats good on RA. Education completed with pt who voiced understanding. Reviewed sternal precautions and staying in the tube, IS, walking for ex, and gave heart healthy diet. Encouraged pt to eat more vegetables and whole grains and fruits. Discussed CRP 2 and referred to [facility]. Did not want to view discharge video. Got liquids for breakfast. Notified RN who ordered solid food for pt.  Pt is interested in participating in Virtual Cardiac Rehab. Pt advised that Virtual Cardiac Rehab is provided at no cost to the patient.  Checklist:  1. Pt has smart device  ie smartphone and/or ipad for downloading an app  Yes 2. Reliable internet/wifi service    Yes 3. Understands how to use their smartphone and navigate within an app.  Yes   Reviewed with pt the scheduling process for virtual cardiac rehab.  Pt verbalized understanding.    Graylon Good, RN BSN  08/02/2018 8:55 AM

## 2018-08-02 NOTE — Plan of Care (Signed)
Poc progressing.  

## 2018-08-02 NOTE — Progress Notes (Signed)
D/c instructions given to patient. Medications and wound care reviewed. All questions answered. IV removed, clean and intact. Wife to escort pt home.  Clyde Canterbury, RN

## 2018-08-03 MED FILL — Lidocaine HCl Local Soln Prefilled Syringe 100 MG/5ML (2%): INTRAMUSCULAR | Qty: 5 | Status: AC

## 2018-08-03 MED FILL — Heparin Sodium (Porcine) Inj 1000 Unit/ML: INTRAMUSCULAR | Qty: 30 | Status: AC

## 2018-08-03 MED FILL — Electrolyte-R (PH 7.4) Solution: INTRAVENOUS | Qty: 5000 | Status: AC

## 2018-08-03 MED FILL — Potassium Chloride Inj 2 mEq/ML: INTRAVENOUS | Qty: 40 | Status: AC

## 2018-08-03 MED FILL — Sodium Bicarbonate IV Soln 8.4%: INTRAVENOUS | Qty: 50 | Status: AC

## 2018-08-03 MED FILL — Calcium Chloride Inj 10%: INTRAVENOUS | Qty: 10 | Status: AC

## 2018-08-03 MED FILL — Lidocaine HCl Local Preservative Free (PF) Inj 2%: INTRAMUSCULAR | Qty: 15 | Status: AC

## 2018-08-03 MED FILL — Mannitol IV Soln 20%: INTRAVENOUS | Qty: 500 | Status: AC

## 2018-08-09 ENCOUNTER — Telehealth: Payer: Self-pay

## 2018-08-09 NOTE — Telephone Encounter (Signed)
Patient contacted the office 08/06/2018 with questions concerning hot flashes after surgery.  Patient is s/p CABG x2 with Dr. Kipp Brood 07/29/2018.  Patient stated that it happens when he drinks hot tea.  Does have some chills but has not checked his temperature.  Patient stated when asked that his incisions look ok and he does not have any shortness of breath but some chest pain.  Pain does go away when prescription medication taken. Advised patient to take his temperature.  If no temperature patient should not worry about hot flashes unless they do not go away.  Patient advised to give the office a call back if symptoms persist or if he has a fever.  He acknowledged receipt.

## 2018-08-13 ENCOUNTER — Telehealth: Payer: Self-pay

## 2018-08-13 NOTE — Telephone Encounter (Signed)
Patient contacted the office with concerns about a dry cough that has started.  He is s/p CABG x2  With Dr. Kipp Brood 07/29/2018.  Per Dr. Kipp Brood patient advised that there is no concern with a dry cough.  Advised patient that if he began to run a temperature or have shortness of breath to contact the office back.  He acknowledged receipt.

## 2018-08-16 DIAGNOSIS — Z8673 Personal history of transient ischemic attack (TIA), and cerebral infarction without residual deficits: Secondary | ICD-10-CM | POA: Insufficient documentation

## 2018-08-16 NOTE — Progress Notes (Signed)
Cardiology Office Note    Date:  08/17/2018   ID:  Tom SisSam Welge, DOB [birth year], MRN [id number]  PCP:  Mila PalmerWolters, Sharon, MD  Cardiologist: Lance MussJayadeep Varanasi, MD EPS: None  Chief Complaint  Patient presents with  . Follow-up    History of Present Illness:  Tom Phelps is a 58 y.o. male with history of hypertension, DM, HLD, family history of early CAD, TIA on Plavix.  Patient saw Dr. Eldridge DaceVaranasi 06/2018 complaining of exertional chest pain shortness of breath coronary CT which showed FFR analysis severe stenosis in the mid LAD.  He underwent cardiac catheterization 07/20/2018 and was found to have severe diffuse two-vessel disease and was referred for CABG.  Patient underwent CABG times 2-7/23/20 with a LIMA to the LAD and RSVG to OM 2.   Patient  Has chest soreness when he sneezes. Walks 1 1/2 miles daily. Sleepy today  But took a tramadol before he came. Has a long knot and tightness above left leg graft site.  Past Medical History:  Diagnosis Date  . Actinic keratoses   . Ankle dislocation    calcaneal tendons  . Anxiety   . Aortic atherosclerosis (HCC)   . Chronic pain of left knee 11/18/2017  . Coronary artery disease   . DDD (degenerative disc disease), cervical   . Diabetes mellitus without complication (HCC)    2019  . Displaced other extraarticular fracture of right calcaneus, initial encounter for closed fracture 07/13/2017  . DOE (dyspnea on exertion)   . Dyspnea   . Fatty liver    mild  . Fracture of distal end of right fibula   . Hypercholesteremia   . Hypercholesterolemia   . Hypertension   . Lightheadedness   . Neck pain   . Prediabetes   . Pulmonary nodules   . Stress reaction   . Stroke (HCC)    2018  . TIA (transient ischemic attack)    circulation     Past Surgical History:  Procedure Laterality Date  . CORONARY ARTERY BYPASS GRAFT N/A 07/29/2018   Procedure: CORONARY ARTERY BYPASS GRAFTING (CABG) x 2;  Surgeon: Corliss SkainsLightfoot, Harrell O, MD;  Location: [facility] OR;   Service: Open Heart Surgery;  Laterality: N/A;  . ENDOVEIN HARVEST OF GREATER SAPHENOUS VEIN Left 07/29/2018   Procedure: Endovein Harvest Of Greater Saphenous Vein Left Thigh; Exploration only of right leg greater saphenous vein;  Surgeon: Corliss SkainsLightfoot, Harrell O, MD;  Location: [facility] OR;  Service: Open Heart Surgery;  Laterality: Left;  . INTRAVASCULAR PRESSURE WIRE/FFR STUDY N/A 07/20/2018   Procedure: INTRAVASCULAR PRESSURE WIRE/FFR STUDY;  Surgeon: Corky CraftsVaranasi, Jayadeep S, MD;  [facility];  Service: Cardiovascular;  Laterality: N/A;  . LEFT HEART CATH AND CORONARY ANGIOGRAPHY N/A 07/20/2018   Procedure: LEFT HEART CATH AND CORONARY ANGIOGRAPHY;  Surgeon: Corky CraftsVaranasi, Jayadeep S, MD;  [facility];  Service: Cardiovascular;  Laterality: N/A;  . TEE WITHOUT CARDIOVERSION N/A 07/29/2018   Procedure: TRANSESOPHAGEAL ECHOCARDIOGRAM (TEE);  Surgeon: Corliss SkainsLightfoot, Harrell O, MD;  [facility];  Service: Open Heart Surgery;  Laterality: N/A;    Current Medications: Current Meds  Medication Sig  . aspirin EC 325 MG EC tablet Take 1 tablet (325 mg total) by mouth every 6 (six) hours as needed.  Marland Kitchen. atorvastatin (LIPITOR) 80 MG tablet Take 1 tablet (80 mg total) by mouth daily.  . metoprolol tartrate (LOPRESSOR) 25 MG tablet Take 1 tablet (25 mg total) by mouth 2 (two) times daily.  . traMADol Janean Sark(ULTRAM)  50 MG tablet Take 1 tablet (50 mg total) by mouth every 6 (six) hours as needed for moderate pain.     Allergies:   Metformin and related and Oxycodone   Social History   Socioeconomic History  . Marital status: Married    Spouse name: Not on file  . Number of children: Not on file  . Years of education: Not on file  . Highest education level: Not on file  Occupational History  . Not on file  Social Needs  . Financial resource strain: Not on file  . Food insecurity    Worry: Not on file    Inability: Not on file  . Transportation needs    Medical: Not on file     Non-medical: Not on file  Tobacco Use  . Smoking status: Never Smoker  . Smokeless tobacco: Never Used  Substance and Sexual Activity  . Alcohol use: Never    Frequency: Never  . Drug use: Never  . Sexual activity: Yes    Comment: married (Azar)  Lifestyle  . Physical activity    Days per week: Not on file    Minutes per session: Not on file  . Stress: Not on file  Relationships  . Social Musicianconnections    Talks on phone: Not on file    Gets together: Not on file    Attends religious service: Not on file    Active member of club or organization: Not on file    Attends meetings of clubs or organizations: Not on file    Relationship status: Not on file  Other Topics Concern  . Not on file  Social History Narrative  . Not on file     Family History:  [name]   family history includes CAD in his father and mother.   ROS:   Please see the history of present illness.    ROS All other systems reviewed and are negative.   PHYSICAL EXAM:   VS:  BP 106/68   Pulse 96   Ht 5\' 10"  (1.778 m)   Wt 193 lb 9.6 oz (87.8 kg)   SpO2 98%   BMI 27.78 kg/m   Physical Exam  GEN: Well nourished, well developed, in no acute distress  Neck: no JVD, carotid bruits, or masses Cardiac:RRR; no murmurs, rubs, or gallops  Respiratory:  clear to auscultation bilaterally, normal work of breathing GI: soft, nontender, nondistended, + BS Ext: tight knot about 4-5 inches above veinectomy site left thigh. Otherwise lower extremity without cyanosis, clubbing, or edema, Good distal pulses bilaterally Neuro:  Alert and Oriented x 3 Psych: euthymic mood, full affect  Wt Readings from Last 3 Encounters:  08/17/18 193 lb 9.6 oz (87.8 kg)  08/02/18 198 lb 6.6 oz (90 kg)  07/28/18 198 lb 6.4 oz (90 kg)      Studies/Labs Reviewed:   EKG:  EKG is not ordered today.    Recent Labs: 07/28/2018: ALT 10 07/30/2018: Magnesium 2.0 07/31/2018: Hemoglobin 7.8; Platelets 129 08/01/2018: BUN 14; Creatinine,  Ser 0.88; Potassium 3.6; Sodium 134   Lipid Panel No results found for: CHOL, TRIG, HDL, CHOLHDL, VLDL, LDLCALC, LDLDIRECT  Additional studies/ records that were reviewed today include:  Cardiac catheterization 07/20/2018  Mid RCA lesion is 50% stenosed. Not significant by DFR: 0.99.  2nd Mrg lesion is 90% stenosed.  Dist LM lesion is 40% stenosed.  Mid LAD-1 lesion is 75% stenosed.  Mid LAD-2 lesion is 75% stenosed.  Ost LAD to Prox  LAD lesion is 70% stenosed.  Prox LAD to Mid LAD lesion is 50% stenosed.  The left ventricular systolic function is normal.  LV end diastolic pressure is normal.  The left ventricular ejection fraction is 55-65% by visual estimate.  There is no aortic valve stenosis.  Hiogh bifurcation of radial artery and ulnar artery leading to more spasm. Would not use radial approach in the future if cath was needed.   Severe diffuse 2 vessel CAD.  Diffuse LAD disease and subtotally occluded OM.    Moderate RCA disease, not significant by DFR.    Given diffuse LAD disease including the ostium, I think LIMA to LAD will be a better long term strategy for revascularization.     Plan for outpatient surgery eval.  Once date of surgery is noted, he can hold plavix 5 days prior.    Coronary CTA 6/30/2020IMPRESSION: 1. Severe CAD, CADRADS = 4. Severe lesions in the LAD, and likely >50% lesions in distal left main coronary artery. Severe proximal RCA lesion. Consider coronary angiography. CT FFR will be performed and reported separately.   2. [name] coronary artery calcium score is 937, which places the patient in the 96th percentile for age and sex matched control.   3. Normal coronary origin with right dominance.     Electronically Signed   By: Weston BrassGayatri  Acharya   On: 07/06/2018 16:23   ADDENDUM: Ramus intermedius branch is present, with severe proximal mixed atherosclerotic plaque, 70-99% stenosis. Mild scattered disease in remainder of  vessel.     Electronically Signed   By: Weston BrassGayatri  Acharya   On: 07/06/2018 16:27   Addended by Parke PoissonAcharya, Gayatri A, MD on 07/06/2018  4:30 PM   ADDENDUM REPORT: 07/06/2018 16:23   . Left Main:  No significant stenosis. FFR = 0.95   2. LAD: Proximal FFR = 0.81, Mid FFR =0.59, Distal FFR = 0.56 3. Ramus intermedius: FFR = 0.85 4. LCX: No significant stenosis. Proximal FFR = 0.89, Distal FFR = not well mapped 5. RCA: No significant stenosis. Proximal FFR = 0.96, Mid FFR = 0.85, Distal FFR = 0.82   IMPRESSION: 1.  CT FFR analysis showed severe stenosis in the mid LAD.   Consider coronary angiography to further evaluate chest pain.     Electronically Signed   By: Weston BrassGayatri  Acharya   On: 07/07/2018 17:53    ASSESSMENT:    1. S/P CABG x 2   2. Essential hypertension   3. Hyperlipidemia, unspecified hyperlipidemia type   4. History of TIA (transient ischemic attack)   5. Acute superficial venous thrombosis of left lower extremity      PLAN:  In order of problems listed above:  Status post CABG x7/23/20-overall doing well without chest pain. Walking 1 1/2 miles daily. Some chest soreness. On asa and lipitor. F/u with Dr. Eldridge DaceVaranasi in 2 months.  Essential hypertension BP running low but stable.   Hyperlipidemia on lipitor check FLP in 2 months  History of TIA previously on plavix but stopped in hospital  Left leg swelling above graft site ? Superficial DVT-will send over to Dr. Alfonse AlpersLightfoots office for evaluation.    Medication Adjustments/Labs and Tests Ordered: Current medicines are reviewed at length with the patient today.  Concerns regarding medicines are outlined above.  Medication changes, Labs and Tests ordered today are listed in the Patient Instructions below. Patient Instructions  Medication Instructions:  Your physician recommends that you continue on your current medications as directed. Please refer to the  Current Medication list given to you today.  If you  need a refill on your cardiac medications before your next appointment, please call your pharmacy.   Lab work: Fasting labs to be drawn on 11/03/18 at your next appointment (LIPIDS, LFTS)  If you have labs (blood work) drawn today and your tests are completely normal, you will receive your results only by: Marland Kitchen MyChart Message (if you have MyChart) OR . A paper copy in the mail If you have any lab test that is abnormal or we need to change your treatment, we will call you to review the results.  Testing/Procedures: None ordered  Follow-Up: . Follow up with Dr. Irish Lack on 11/03/18 at 11:20 AM . Dr. Abran Duke office will call you to arrange an appointment with them TODAY.  Any Other Special Instructions Will Be Listed Below (If Applicable).       Sumner Boast, PA-C  08/17/2018 11:40 AM    [facility] [address], [place], [place]  [address] Phone: [phone]; Fax: [phone]

## 2018-08-17 ENCOUNTER — Encounter: Payer: Self-pay | Admitting: Physician Assistant

## 2018-08-17 ENCOUNTER — Other Ambulatory Visit: Payer: Self-pay

## 2018-08-17 ENCOUNTER — Ambulatory Visit (INDEPENDENT_AMBULATORY_CARE_PROVIDER_SITE_OTHER): Payer: Self-pay | Admitting: Physician Assistant

## 2018-08-17 ENCOUNTER — Ambulatory Visit: Payer: Medicare HMO | Admitting: Physician Assistant

## 2018-08-17 VITALS — BP 116/76 | HR 71 | Temp 97.7°F | Resp 16 | Ht 70.0 in | Wt 193.0 lb

## 2018-08-17 VITALS — BP 106/68 | HR 96 | Ht 70.0 in | Wt 193.6 lb

## 2018-08-17 DIAGNOSIS — Z8673 Personal history of transient ischemic attack (TIA), and cerebral infarction without residual deficits: Secondary | ICD-10-CM

## 2018-08-17 DIAGNOSIS — E785 Hyperlipidemia, unspecified: Secondary | ICD-10-CM

## 2018-08-17 DIAGNOSIS — I10 Essential (primary) hypertension: Secondary | ICD-10-CM

## 2018-08-17 DIAGNOSIS — Z951 Presence of aortocoronary bypass graft: Secondary | ICD-10-CM | POA: Diagnosis not present

## 2018-08-17 DIAGNOSIS — I82812 Embolism and thrombosis of superficial veins of left lower extremities: Secondary | ICD-10-CM

## 2018-08-17 DIAGNOSIS — I2510 Atherosclerotic heart disease of native coronary artery without angina pectoris: Secondary | ICD-10-CM

## 2018-08-17 NOTE — Patient Instructions (Addendum)
Medication Instructions:  Your physician recommends that you continue on your current medications as directed. Please refer to the Current Medication list given to you today.  If you need a refill on your cardiac medications before your next appointment, please call your pharmacy.   Lab work: Fasting labs to be drawn on 11/03/18 at your next appointment (LIPIDS, LFTS)  If you have labs (blood work) drawn today and your tests are completely normal, you will receive your results only by: Marland Kitchen MyChart Message (if you have MyChart) OR . A paper copy in the mail If you have any lab test that is abnormal or we need to change your treatment, we will call you to review the results.  Testing/Procedures: None ordered  Follow-Up: . Follow up with Dr. Irish Lack on 11/03/18 at 11:20 AM . Dr. Abran Duke office will call you to arrange an appointment with them TODAY.  Any Other Special Instructions Will Be Listed Below (If Applicable).

## 2018-08-17 NOTE — Patient Instructions (Signed)
No change in medications. F/U with Dr. Kipp Brood at previously scheduled appointment on 8/21. Notify our office if you develop fever, pain, redness, or drainage at the left leg vein harvest incision.

## 2018-08-17 NOTE — Progress Notes (Signed)
  HPI:  Patient returns for a postoperative wound check having undergone CABG x 2  by Dr. Kipp Brood on 07/29/18 for unstable angina.   [name] early postoperative recovery while in the hospital was uncomplicated and he was discharged to home on the 4th post-op day.  While he was being seen at a routine cardiology office visit today  he was noted to have induration along the left leg EVH tunnel and evaluation in our office was requested.  Overall, Tom Phelps feels he has been making progress since his discharge. He is having minor expected sternal discomfort but no angina or shortness of breath. He denies any pain, fever, or drainage from any of his incisions. He is walking about 1 1/2 miles daily.   Current Outpatient Medications  Medication Sig Dispense Refill  . aspirin EC 325 MG EC tablet Take 1 tablet (325 mg total) by mouth every 6 (six) hours as needed.    Marland Kitchen atorvastatin (LIPITOR) 80 MG tablet Take 1 tablet (80 mg total) by mouth daily. 30 tablet 11  . metoprolol tartrate (LOPRESSOR) 25 MG tablet Take 1 tablet (25 mg total) by mouth 2 (two) times daily. 180 tablet 1  . traMADol (ULTRAM) 50 MG tablet Take 1 tablet (50 mg total) by mouth every 6 (six) hours as needed for moderate pain. 28 tablet 0   No current facility-administered medications for this visit.     Physical Exam   BP:116/76       P:71         RR:16        T: 97.23F       SpO2: 96%      Heart-RRR Chest.- breath sounds are clear, full and equal. The sternotomy incision is healing without evidence of complication, the sternum is stable.  Exts: EVH incisions above and below the knee on the right are well approximated and healing nicely.  The left thigh EVH incision is also well approximated and dry.  There are a few suture fibers exposed.  There is a firm cord palpable along the EVH tunnel at the medial left thigh. This is not particularly tender and the over-lying tissues are healthy with no erythema or induration. There is no  fluctuance or evidence of drainage.   Diagnostic Tests: None  Impression / Plan: I suspect Tom Phelps has an organized hematoma along the [facility] tunnel that may well have formed after leaving the OR.  This is not causing him any distress and shows no signs of infection or impending drainage.  I see no reason for intervention at this point but explained that it could potentially drain spontaneously or show signs of infection at some point and would need further attention.  He is already scheduled for routine post-op follow up in our office on 08/27/18 and we can re-evaluate then.  I asked him to contact us anytime in the interim if he notices any changes that are of concern  to him.    Antony Odea, PA-C [facility] [phone]

## 2018-08-26 ENCOUNTER — Other Ambulatory Visit: Payer: Self-pay | Admitting: Thoracic Surgery (Cardiothoracic Vascular Surgery)

## 2018-08-26 DIAGNOSIS — Z951 Presence of aortocoronary bypass graft: Secondary | ICD-10-CM

## 2018-08-27 ENCOUNTER — Ambulatory Visit
Admission: RE | Admit: 2018-08-27 | Discharge: 2018-08-27 | Disposition: A | Discharge status: Home / Self Care | Payer: Medicare HMO | Source: Ambulatory Visit | Attending: Thoracic Surgery (Cardiothoracic Vascular Surgery) | Admitting: Thoracic Surgery (Cardiothoracic Vascular Surgery)

## 2018-08-27 ENCOUNTER — Other Ambulatory Visit: Payer: Self-pay

## 2018-08-27 ENCOUNTER — Encounter: Payer: Self-pay | Admitting: Thoracic Surgery (Cardiothoracic Vascular Surgery)

## 2018-08-27 ENCOUNTER — Ambulatory Visit (INDEPENDENT_AMBULATORY_CARE_PROVIDER_SITE_OTHER): Payer: Self-pay | Admitting: Thoracic Surgery (Cardiothoracic Vascular Surgery)

## 2018-08-27 VITALS — BP 120/80 | HR 96 | Temp 96.8°F | Resp 20 | Ht 70.0 in | Wt 188.0 lb

## 2018-08-27 DIAGNOSIS — I25119 Atherosclerotic heart disease of native coronary artery with unspecified angina pectoris: Secondary | ICD-10-CM

## 2018-08-27 DIAGNOSIS — J90 Pleural effusion, not elsewhere classified: Secondary | ICD-10-CM

## 2018-08-27 DIAGNOSIS — Z951 Presence of aortocoronary bypass graft: Secondary | ICD-10-CM | POA: Diagnosis not present

## 2018-08-27 NOTE — Progress Notes (Signed)
      [address]       [place] [address]             [phone]        Gareth Manfre [place] Medical Record #[id number] Date of Birth: 21-Aug-1960  Referring: Jettie Booze, MD Primary Care: Jonathon Jordan, MD Primary Cardiologist:Jayadeep Irish Lack, MD  Reason for visit:   follow-up  History of Present Illness:     Doing well.  Ambulating regularly.  Only complains of itching around the incision.     Physical Exam: BP 120/80   Pulse 96   Temp (!) 96.8 F (36 C) (Skin)   Resp 20   Ht 5\' 10"  (1.778 m)   Wt 188 lb (85.3 kg)   SpO2 97% Comment: RA  BMI 26.98 kg/m   Alert NAD RRR, no murmur.  Incision well healed.  Sternum stable Abdomen soft, NT/ND no peripheral edema   Diagnostic Studies & Laboratory data: CXR: effusion on the Left     Assessment / Plan:   58 yo male s/p CABG 2, with left pleural effusion.  Doing well Will schedule a US guided thoracentesis Clear for Cardiac rehab Will f/u repeat CXR   Lajuana Matte 08/27/2018 10:52 AM

## 2018-08-31 ENCOUNTER — Other Ambulatory Visit: Payer: Self-pay | Admitting: *Deleted

## 2018-08-31 DIAGNOSIS — J90 Pleural effusion, not elsewhere classified: Secondary | ICD-10-CM

## 2018-08-31 DIAGNOSIS — Z951 Presence of aortocoronary bypass graft: Secondary | ICD-10-CM

## 2018-09-01 ENCOUNTER — Other Ambulatory Visit (HOSPITAL_COMMUNITY)
Admission: RE | Admit: 2018-09-01 | Discharge: 2018-09-01 | Disposition: A | Discharge status: Home / Self Care | Payer: Medicare HMO | Source: Ambulatory Visit | Attending: Thoracic Surgery (Cardiothoracic Vascular Surgery) | Admitting: Thoracic Surgery (Cardiothoracic Vascular Surgery)

## 2018-09-01 DIAGNOSIS — Z20828 Contact with and (suspected) exposure to other viral communicable diseases: Secondary | ICD-10-CM | POA: Diagnosis not present

## 2018-09-01 DIAGNOSIS — Z01812 Encounter for preprocedural laboratory examination: Secondary | ICD-10-CM | POA: Insufficient documentation

## 2018-09-01 DIAGNOSIS — J90 Pleural effusion, not elsewhere classified: Secondary | ICD-10-CM

## 2018-09-01 LAB — SARS CORONAVIRUS 2 (TAT 6-24 HRS): SARS Coronavirus 2: NEGATIVE

## 2018-09-02 ENCOUNTER — Ambulatory Visit (HOSPITAL_COMMUNITY)
Admission: RE | Admit: 2018-09-02 | Discharge: 2018-09-02 | Disposition: A | Discharge status: Home / Self Care | Payer: Medicare HMO | Source: Ambulatory Visit | Attending: Student | Admitting: Student

## 2018-09-02 ENCOUNTER — Ambulatory Visit (HOSPITAL_COMMUNITY)
Admission: RE | Admit: 2018-09-02 | Discharge: 2018-09-02 | Disposition: A | Discharge status: Home / Self Care | Payer: Medicare HMO | Source: Ambulatory Visit | Attending: Thoracic Surgery (Cardiothoracic Vascular Surgery) | Admitting: Thoracic Surgery (Cardiothoracic Vascular Surgery)

## 2018-09-02 ENCOUNTER — Other Ambulatory Visit: Payer: Self-pay

## 2018-09-02 DIAGNOSIS — R0600 Dyspnea, unspecified: Secondary | ICD-10-CM | POA: Diagnosis not present

## 2018-09-02 DIAGNOSIS — J90 Pleural effusion, not elsewhere classified: Secondary | ICD-10-CM | POA: Diagnosis not present

## 2018-09-02 DIAGNOSIS — R079 Chest pain, unspecified: Secondary | ICD-10-CM | POA: Insufficient documentation

## 2018-09-02 DIAGNOSIS — Z951 Presence of aortocoronary bypass graft: Secondary | ICD-10-CM | POA: Diagnosis not present

## 2018-09-02 DIAGNOSIS — Z9889 Other specified postprocedural states: Secondary | ICD-10-CM | POA: Diagnosis not present

## 2018-09-02 MED ORDER — LIDOCAINE HCL 1 % IJ SOLN
INTRAMUSCULAR | Status: AC
  Filled 2018-09-02: qty 10

## 2018-09-02 NOTE — Procedures (Signed)
PROCEDURE SUMMARY:  Successful image-guided left thoracentesis. Yielded 400 milliliters of dark red fluid. Patient tolerated procedure well. No immediate complications. EBL < 5 mL.  Specimen was not sent for labs. CXR ordered.  Claris Pong Louk PA-C 09/02/2018 2:15 PM

## 2018-09-03 ENCOUNTER — Ambulatory Visit (HOSPITAL_COMMUNITY): Admission: RE | Admit: 2018-09-03 | Payer: Medicare HMO | Source: Ambulatory Visit

## 2018-09-09 ENCOUNTER — Telehealth: Payer: Self-pay | Admitting: Interventional Cardiology

## 2018-09-09 NOTE — Telephone Encounter (Signed)
New message:  Patient was calling to follow-up from his MyChart message he sent Dr. Irish Lack. He has noticed that he has had some temper issues since his bypass surgery. He was not sure if this was an issue that Dr. Irish Lack could handle, or if he should contact his PCP. He would like Dr. Irish Lack or his nurse to give him a call when they are back in the clinic.

## 2018-09-09 NOTE — Telephone Encounter (Signed)
Pt is calling in to inform Dr. Irish Lack that ever since he had his bypass surgery, he has noticed that he is very emotional, very easy to get stressed out or upset with any situation, and very short-tempered.  Pt would like to know if there is any correlation of pts having this issue after bypass?  Pt states his family members have definitely noticed a difference in the way he responds to different situations.  Pt is wanting advice if he should refer to his PCP for this, or does Dr. Irish Lack need to address this issue.  Informed the pt that I will route this message to Dr. Irish Lack and his covering RN for further review, recommendation, and follow-up with the pt thereafter.  Informed the pt that if Dr. Irish Lack responds today, a triage nurse will follow-up with him thereafter, with further recommendations.  Pt verbalized understanding and agrees with this plan.

## 2018-09-10 NOTE — Telephone Encounter (Signed)
Responded back to patient's MyChart message letting him know that Dr. Irish Lack had reviewed his message and did not see any medications that would change your temper. Let patient know that if was having pain, this can cause some irritability and that he can use the tramadol to help with that. If that did not help instructed patient to follow up with PCP for other causes.

## 2018-09-23 DIAGNOSIS — R079 Chest pain, unspecified: Secondary | ICD-10-CM | POA: Diagnosis not present

## 2018-10-06 ENCOUNTER — Other Ambulatory Visit: Payer: Self-pay

## 2018-10-06 ENCOUNTER — Ambulatory Visit
Admission: RE | Admit: 2018-10-06 | Discharge: 2018-10-06 | Disposition: A | Discharge status: Home / Self Care | Payer: Medicare HMO | Source: Ambulatory Visit | Attending: Family Medicine | Admitting: Family Medicine

## 2018-10-06 ENCOUNTER — Other Ambulatory Visit: Payer: Self-pay | Admitting: Family Medicine

## 2018-10-06 DIAGNOSIS — R0789 Other chest pain: Secondary | ICD-10-CM

## 2018-10-06 DIAGNOSIS — R079 Chest pain, unspecified: Secondary | ICD-10-CM | POA: Diagnosis not present

## 2018-10-07 DIAGNOSIS — M1712 Unilateral primary osteoarthritis, left knee: Secondary | ICD-10-CM | POA: Diagnosis not present

## 2018-10-07 DIAGNOSIS — M21161 Varus deformity, not elsewhere classified, right knee: Secondary | ICD-10-CM | POA: Diagnosis not present

## 2018-10-07 DIAGNOSIS — M25562 Pain in left knee: Secondary | ICD-10-CM | POA: Diagnosis not present

## 2018-10-07 DIAGNOSIS — M170 Bilateral primary osteoarthritis of knee: Secondary | ICD-10-CM | POA: Diagnosis not present

## 2018-10-07 DIAGNOSIS — R269 Unspecified abnormalities of gait and mobility: Secondary | ICD-10-CM | POA: Diagnosis not present

## 2018-10-07 DIAGNOSIS — M21162 Varus deformity, not elsewhere classified, left knee: Secondary | ICD-10-CM | POA: Diagnosis not present

## 2018-10-14 DIAGNOSIS — E7800 Pure hypercholesterolemia, unspecified: Secondary | ICD-10-CM | POA: Diagnosis not present

## 2018-10-14 DIAGNOSIS — G459 Transient cerebral ischemic attack, unspecified: Secondary | ICD-10-CM | POA: Diagnosis not present

## 2018-10-14 DIAGNOSIS — R69 Illness, unspecified: Secondary | ICD-10-CM | POA: Diagnosis not present

## 2018-10-14 DIAGNOSIS — I2510 Atherosclerotic heart disease of native coronary artery without angina pectoris: Secondary | ICD-10-CM | POA: Diagnosis not present

## 2018-10-14 DIAGNOSIS — Z0000 Encounter for general adult medical examination without abnormal findings: Secondary | ICD-10-CM | POA: Diagnosis not present

## 2018-10-14 DIAGNOSIS — E1169 Type 2 diabetes mellitus with other specified complication: Secondary | ICD-10-CM | POA: Diagnosis not present

## 2018-10-14 DIAGNOSIS — M9981 Other biomechanical lesions of cervical region: Secondary | ICD-10-CM | POA: Diagnosis not present

## 2018-10-14 DIAGNOSIS — M5030 Other cervical disc degeneration, unspecified cervical region: Secondary | ICD-10-CM | POA: Diagnosis not present

## 2018-10-14 DIAGNOSIS — Z79899 Other long term (current) drug therapy: Secondary | ICD-10-CM | POA: Diagnosis not present

## 2018-10-14 DIAGNOSIS — Z23 Encounter for immunization: Secondary | ICD-10-CM | POA: Diagnosis not present

## 2018-10-26 DIAGNOSIS — R69 Illness, unspecified: Secondary | ICD-10-CM | POA: Diagnosis not present

## 2018-11-02 NOTE — Progress Notes (Signed)
Cardiology Office Note   Date:  11/03/2018   ID:  Arsal, Tappan Apr 26, 1960, MRN [id number]  PCP:  Mila Palmer, MD    No chief complaint on file.  CAD  Wt Readings from Last 3 Encounters:  11/03/18 195 lb 6.4 oz (88.6 kg)  08/27/18 188 lb (85.3 kg)  08/17/18 193 lb (87.5 kg)       History of Present Illness: Tom Phelps is a 58 y.o. male  with history of hypertension, DM, HLD, family history of early CAD, TIA on Plavix.  I saw him on 06/2018 complaining of exertional chest pain shortness of breath coronary CT which showed FFR analysis severe stenosis in the mid LAD.  He underwent cardiac catheterization 07/20/2018 and was found to have severe diffuse two-vessel disease and was referred for CABG.  Patient underwent CABG times 2-7/23/20 with a LIMA to the LAD and RSVG to OM 2. He had a pleural effusion that was drained. F/u CXR showed no effusion.  Acute superficial venous thrombosis of left lower extremity.  Since the last visit, he has done well.  Angina has resolved.    Denies : Chest pain. Dizziness. Leg edema. Nitroglycerin use. Orthopnea. Palpitations. Paroxysmal nocturnal dyspnea. Syncope.   Has some DOE that is getting better.  He is walking daily.  He feels ok walking up stairs.     Past Medical History:  Diagnosis Date  . Actinic keratoses   . Ankle dislocation    calcaneal tendons  . Anxiety   . Aortic atherosclerosis (HCC)   . Chronic pain of left knee 11/18/2017  . Coronary artery disease   . DDD (degenerative disc disease), cervical   . Diabetes mellitus without complication (HCC)    2019  . Displaced other extraarticular fracture of right calcaneus, initial encounter for closed fracture 07/13/2017  . DOE (dyspnea on exertion)   . Dyspnea   . Fatty liver    mild  . Fracture of distal end of right fibula   . Hypercholesteremia   . Hypercholesterolemia   . Hypertension   . Lightheadedness   . Neck pain   . Prediabetes   . Pulmonary nodules   .  Stress reaction   . Stroke (HCC)    2018  . TIA (transient ischemic attack)    circulation     Past Surgical History:  Procedure Laterality Date  . CORONARY ARTERY BYPASS GRAFT N/A 07/29/2018   Procedure: CORONARY ARTERY BYPASS GRAFTING (CABG) x 2;  Surgeon: Corliss Skains, MD;  Location: [facility] OR;  Service: Open Heart Surgery;  Laterality: N/A;  . ENDOVEIN HARVEST OF GREATER SAPHENOUS VEIN Left 07/29/2018   Procedure: Endovein Harvest Of Greater Saphenous Vein Left Thigh; Exploration only of right leg greater saphenous vein;  Surgeon: Corliss Skains, MD;  Location: [facility] OR;  Service: Open Heart Surgery;  Laterality: Left;  . INTRAVASCULAR PRESSURE WIRE/FFR STUDY N/A 07/20/2018   Procedure: INTRAVASCULAR PRESSURE WIRE/FFR STUDY;  Surgeon: Corky Crafts, MD;  Location: [facility] INVASIVE CV LAB;  Service: Cardiovascular;  Laterality: N/A;  . LEFT HEART CATH AND CORONARY ANGIOGRAPHY N/A 07/20/2018   Procedure: LEFT HEART CATH AND CORONARY ANGIOGRAPHY;  Surgeon: Corky Crafts, MD;  Location: [facility] INVASIVE CV LAB;  Service: Cardiovascular;  Laterality: N/A;  . TEE WITHOUT CARDIOVERSION N/A 07/29/2018   Procedure: TRANSESOPHAGEAL ECHOCARDIOGRAM (TEE);  Surgeon: Corliss Skains, MD;  Location: [facility] OR;  Service: Open Heart Surgery;  Laterality: N/A;     Current Outpatient Medications  Medication Sig Dispense Refill  . aspirin EC 325 MG EC tablet Take 1 tablet (325 mg total) by mouth every 6 (six) hours as needed. (Patient taking differently: Take 325 mg by mouth daily. )    . atorvastatin (LIPITOR) 80 MG tablet Take 1 tablet (80 mg total) by mouth daily. 30 tablet 11  . clopidogrel (PLAVIX) 75 MG tablet Take 75 mg by mouth daily.    Marland Kitchen escitalopram (LEXAPRO) 10 MG tablet TAKE 1 2 (ONE HALF) TABLET BY MOUTH ONCE DAILY FOR 7 DAYS THEN TAKE 1 TABLET ONCE DAILY FOR 30 DAYS    . ibuprofen (ADVIL) 800 MG tablet Take 800 mg by mouth 3 (three) times daily as needed.    . metoprolol tartrate  (LOPRESSOR) 25 MG tablet Take 1 tablet (25 mg total) by mouth 2 (two) times daily. 180 tablet 1  . traMADol (ULTRAM) 50 MG tablet Take 1 tablet (50 mg total) by mouth every 6 (six) hours as needed for moderate pain. 28 tablet 0   No current facility-administered medications for this visit.     Allergies:   Metformin and related and Oxycodone    Social History:  The patient  reports that he has never smoked. He has never used smokeless tobacco. He reports that he does not drink alcohol or use drugs.   Family History:  [name] family history includes CAD in his father and mother.    ROS:  Please see the history of present illness.   Otherwise, review of systems are positive for mild DOE, improving.   All other systems are reviewed and negative.    PHYSICAL EXAM: VS:  BP 116/72   Pulse 82   Ht 5\' 10"  (1.778 m)   Wt 195 lb 6.4 oz (88.6 kg)   SpO2 97%   BMI 28.04 kg/m  , BMI Body mass index is 28.04 kg/m. GEN: Well nourished, well developed, in no acute distress  HEENT: normal  Neck: no JVD, carotid bruits, or masses Cardiac: RRR; no murmurs, rubs, or gallops,no edema  Respiratory:  clear to auscultation bilaterally, normal work of breathing GI: soft, nontender, nondistended, + BS MS: no deformity or atrophy  Skin: warm and dry, no rash Neuro:  Strength and sensation are intact Psych: euthymic mood, full affect   EKG:   The ekg ordered today demonstrates    Recent Labs: 07/28/2018: ALT 10 07/30/2018: Magnesium 2.0 07/31/2018: Hemoglobin 7.8; Platelets 129 08/01/2018: BUN 14; Creatinine, Ser 0.88; Potassium 3.6; Sodium 134   Lipid Panel No results found for: CHOL, TRIG, HDL, CHOLHDL, VLDL, LDLCALC, LDLDIRECT   Other studies Reviewed: Additional studies/ records that were reviewed today with results demonstrating: .   ASSESSMENT AND PLAN:  1. CAD: No angina.  COntinue aggressive secondary prevention.  Will decrease aspirin at next visit. 2. HTN: The current  medical regimen is effective;  continue present plan and medications. 3. Hyperlipidemia: The current medical regimen is effective;  continue present plan and medications. 4. TIA: COntinue clopidogrel.   Current medicines are reviewed at length with the patient today.  The patient concerns regarding his medicines were addressed.  The following changes have been made:  No change  Labs/ tests ordered today include:  No orders of the defined types were placed in this encounter.   Recommend 150 minutes/week of aerobic exercise Low fat, low carb, high fiber diet recommended  Disposition:   FU in 6 months   Signed, Larae Grooms, MD  11/03/2018 11:47 AM  [facility] [place], [place], [place]  [address] Phone: [phone]; Fax: [phone]

## 2018-11-03 ENCOUNTER — Ambulatory Visit: Payer: Medicare HMO | Admitting: Interventional Cardiology

## 2018-11-03 ENCOUNTER — Other Ambulatory Visit: Payer: Self-pay

## 2018-11-03 ENCOUNTER — Encounter: Payer: Self-pay | Admitting: Interventional Cardiology

## 2018-11-03 VITALS — BP 116/72 | HR 82 | Ht 70.0 in | Wt 195.4 lb

## 2018-11-03 DIAGNOSIS — E782 Mixed hyperlipidemia: Secondary | ICD-10-CM

## 2018-11-03 DIAGNOSIS — I25118 Atherosclerotic heart disease of native coronary artery with other forms of angina pectoris: Secondary | ICD-10-CM

## 2018-11-03 DIAGNOSIS — I10 Essential (primary) hypertension: Secondary | ICD-10-CM

## 2018-11-03 DIAGNOSIS — M1712 Unilateral primary osteoarthritis, left knee: Secondary | ICD-10-CM | POA: Diagnosis not present

## 2018-11-03 DIAGNOSIS — M25562 Pain in left knee: Secondary | ICD-10-CM | POA: Diagnosis not present

## 2018-11-03 DIAGNOSIS — Z8673 Personal history of transient ischemic attack (TIA), and cerebral infarction without residual deficits: Secondary | ICD-10-CM | POA: Diagnosis not present

## 2018-11-03 NOTE — Patient Instructions (Signed)
Medication Instructions:  Your physician recommends that you continue on your current medications as directed. Please refer to the Current Medication list given to you today.  *If you need a refill on your cardiac medications before your next appointment, please call your pharmacy*  Lab Work: None ordered If you have labs (blood work) drawn today and your tests are completely normal, you will receive your results only by: Marland Kitchen MyChart Message (if you have MyChart) OR . A paper copy in the mail If you have any lab test that is abnormal or we need to change your treatment, we will call you to review the results.  Testing/Procedures: None ordered  Follow-Up: At [facility], you and your health needs are our priority.  As part of our continuing mission to provide you with exceptional heart care, we have created designated Provider Care Teams.  These Care Teams include your primary Cardiologist (physician) and Advanced Practice Providers (APPs -  Physician Assistants and Nurse Practitioners) who all work together to provide you with the care you need, when you need it.  Your next appointment:   6 months  The format for your next appointment:   In Person  Provider:   Casandra Doffing, MD  Other Instructions

## 2018-11-24 DIAGNOSIS — M1712 Unilateral primary osteoarthritis, left knee: Secondary | ICD-10-CM | POA: Diagnosis not present

## 2018-11-24 DIAGNOSIS — M25562 Pain in left knee: Secondary | ICD-10-CM | POA: Diagnosis not present

## 2019-01-21 DIAGNOSIS — Z03818 Encounter for observation for suspected exposure to other biological agents ruled out: Secondary | ICD-10-CM | POA: Diagnosis not present

## 2019-02-15 ENCOUNTER — Other Ambulatory Visit: Payer: Self-pay

## 2019-02-15 ENCOUNTER — Encounter: Payer: Self-pay | Admitting: Family Medicine

## 2019-02-15 ENCOUNTER — Ambulatory Visit (INDEPENDENT_AMBULATORY_CARE_PROVIDER_SITE_OTHER): Payer: Medicare HMO | Admitting: Family Medicine

## 2019-02-15 DIAGNOSIS — M25562 Pain in left knee: Secondary | ICD-10-CM

## 2019-02-15 DIAGNOSIS — G8929 Other chronic pain: Secondary | ICD-10-CM | POA: Diagnosis not present

## 2019-02-15 NOTE — Progress Notes (Signed)
Office Visit Note   Patient: Tom Phelps           Date of Birth: [birth year]           MRN: [id number] Visit Date: 02/15/2019 Requested by: Mila Palmer, MD [address] [place],  [place] [address] PCP: Mila Palmer, MD  Subjective: Chief Complaint  Patient presents with  . Left Knee - Pain    Pain has flared again in the left knee x 1 week. Has seen Dr. Roda Shutters in 2019 for this. Hurts to walk much - walks with a limp. Swelling.    HPI: He is here with flareup of left knee pain.  He has a known history of DJD.  Last time he was given cortisone injection.  He went elsewhere to have flexagenic/hyaluronic acid injections.  These helped for a while.  Recently it flared up again about a week ago.  Walking with a limp.  No mechanical symptoms.              ROS: No fever or chills.  All other systems were reviewed and are negative.  Objective: Vital Signs: There were no vitals taken for this visit.  Physical Exam:  General:  Alert and oriented, in no acute distress. Pulm:  Breathing unlabored. Psy:  Normal mood, congruent affect. Skin: No erythema. Left knee: 2+ effusion with no warmth.  Full active extension, flexion of 120 degrees.  Very tender on the medial joint line.  Imaging: None today  Assessment & Plan: 1.  Left knee pain with effusion, most likely a flareup of underlying DJD. -Discussed options with him and elected to aspirate and inject with cortisone today.  Viscosupplementation if symptoms persist.     Procedures: Left knee aspiration and injection: After sterile prep with Betadine, injected 5 cc 1% lidocaine without epinephrine then aspirated about 20 cc of clear yellow synovial fluid with cartilaginous pieces, then injected 40 mg methylprednisolone from superolateral approach.    PMFS History: Patient Active Problem List   Diagnosis Date Noted  . History of TIA (transient ischemic attack) 08/16/2018  . S/P CABG x 2 07/29/2018  . Angina  pectoris (HCC)   . Chronic pain of left knee 11/18/2017  . Displaced other extraarticular fracture of right calcaneus, initial encounter for closed fracture 07/13/2017  . Vertigo 12/10/2016   Past Medical History:  Diagnosis Date  . Actinic keratoses   . Ankle dislocation    calcaneal tendons  . Anxiety   . Aortic atherosclerosis (HCC)   . Chronic pain of left knee 11/18/2017  . Coronary artery disease   . DDD (degenerative disc disease), cervical   . Diabetes mellitus without complication (HCC)    2019  . Displaced other extraarticular fracture of right calcaneus, initial encounter for closed fracture 07/13/2017  . DOE (dyspnea on exertion)   . Dyspnea   . Fatty liver    mild  . Fracture of distal end of right fibula   . Hypercholesteremia   . Hypercholesterolemia   . Hypertension   . Lightheadedness   . Neck pain   . Prediabetes   . Pulmonary nodules   . Stress reaction   . Stroke (HCC)    2018  . TIA (transient ischemic attack)    circulation     Family History  Problem Relation Age of Onset  . CAD Mother   . CAD Father     Past Surgical History:  Procedure Laterality Date  . CORONARY ARTERY BYPASS  GRAFT N/A 07/29/2018   Procedure: CORONARY ARTERY BYPASS GRAFTING (CABG) x 2;  Surgeon: Lajuana Matte, MD;  Location: [facility];  Service: Open Heart Surgery;  Laterality: N/A;  . ENDOVEIN HARVEST OF GREATER SAPHENOUS VEIN Left 07/29/2018   Procedure: Endovein Harvest Of Greater Saphenous Vein Left Thigh; Exploration only of right leg greater saphenous vein;  Surgeon: Lajuana Matte, MD;  Location: [facility];  Service: Open Heart Surgery;  Laterality: Left;  . INTRAVASCULAR PRESSURE WIRE/FFR STUDY N/A 07/20/2018   Procedure: INTRAVASCULAR PRESSURE WIRE/FFR STUDY;  Surgeon: Jettie Booze, MD;  Location: [facility] CV LAB;  Service: Cardiovascular;  Laterality: N/A;  . LEFT HEART CATH AND CORONARY ANGIOGRAPHY N/A 07/20/2018   Procedure: LEFT HEART CATH AND CORONARY  ANGIOGRAPHY;  Surgeon: Jettie Booze, MD;  Location: [facility] CV LAB;  Service: Cardiovascular;  Laterality: N/A;  . TEE WITHOUT CARDIOVERSION N/A 07/29/2018   Procedure: TRANSESOPHAGEAL ECHOCARDIOGRAM (TEE);  Surgeon: Lajuana Matte, MD;  Location: [facility];  Service: Open Heart Surgery;  Laterality: N/A;   Social History   Occupational History  . Not on file  Tobacco Use  . Smoking status: Never Smoker  . Smokeless tobacco: Never Used  Substance and Sexual Activity  . Alcohol use: Never  . Drug use: Never  . Sexual activity: Yes    Comment: married Production assistant, radio)

## 2019-02-15 NOTE — Patient Instructions (Signed)
    Glucosamine Sulfate:  1,000 mg twice daily  Turmeric:  500 mg twice daily (anti-inflammatory effects).

## 2019-03-07 ENCOUNTER — Telehealth: Payer: Self-pay

## 2019-03-07 ENCOUNTER — Telehealth: Payer: Self-pay | Admitting: Family Medicine

## 2019-03-07 DIAGNOSIS — R109 Unspecified abdominal pain: Secondary | ICD-10-CM | POA: Diagnosis not present

## 2019-03-07 DIAGNOSIS — N3000 Acute cystitis without hematuria: Secondary | ICD-10-CM | POA: Diagnosis not present

## 2019-03-07 DIAGNOSIS — L309 Dermatitis, unspecified: Secondary | ICD-10-CM | POA: Diagnosis not present

## 2019-03-07 DIAGNOSIS — E1169 Type 2 diabetes mellitus with other specified complication: Secondary | ICD-10-CM | POA: Diagnosis not present

## 2019-03-07 MED ORDER — DICLOFENAC SODIUM 1 % EX GEL: 4.0000 g | Freq: Four times a day (QID) | CUTANEOUS | 6 refills | Status: DC | PRN

## 2019-03-07 MED ORDER — CELECOXIB 200 MG PO CAPS: 200.0000 mg | ORAL_CAPSULE | Freq: Two times a day (BID) | ORAL | 6 refills | Status: DC | PRN

## 2019-03-07 NOTE — Telephone Encounter (Signed)
Patient called and stated that he had fluid taken off last week and knee is not feeling any better. Still having pain. Wants to know what do you suggest?  Please call patient @[phone]

## 2019-03-07 NOTE — Telephone Encounter (Signed)
I requested approval for gel injections. I called and spoke with patient concerning Voltaren gel and gel injections. He said that he received gel injections at another office in October of last year and did not get any relief from them. Patient would like to know if you want him to try those again or something else? Thanks!

## 2019-03-07 NOTE — Telephone Encounter (Signed)
Please get authorization for left knee gel injections. This is a patient of Dr.Hilts. Thanks!

## 2019-03-07 NOTE — Telephone Encounter (Signed)
error 

## 2019-03-07 NOTE — Telephone Encounter (Signed)
Spoke with patient. He is going to call us tomorrow and let us know if he wants to get precerted for the Zilretta injections or not.

## 2019-03-07 NOTE — Telephone Encounter (Signed)
We could try to get approval for Zilretta injections (long-acting cortisone) instead.

## 2019-03-07 NOTE — Telephone Encounter (Signed)
Accidentally called in celebrex.  Please cancel this since he's on plavix.    Called in voltaren gel instead.  Also, please request approval for gel injections for knee OA.

## 2019-03-07 NOTE — Addendum Note (Signed)
Addended by: Lillia Carmel on: 03/07/2019 02:51 PM   Modules accepted: Orders

## 2019-03-07 NOTE — Telephone Encounter (Signed)
Please advise and I can call patient. Thanks!

## 2019-03-08 ENCOUNTER — Telehealth: Payer: Self-pay | Admitting: Family Medicine

## 2019-03-08 NOTE — Telephone Encounter (Signed)
Patient called.   He was told to call and give [place] the names of the gel injections he had preciously received from his other providers. The names are zilretta and orthovaisc   Call back: [phone]

## 2019-03-08 NOTE — Telephone Encounter (Signed)
Per pt, he doesn't want to proceed with Gel injections.

## 2019-03-08 NOTE — Telephone Encounter (Signed)
See below

## 2019-03-08 NOTE — Telephone Encounter (Signed)
Message received. Pt will call back to let us know about doing the zilretta injection

## 2019-03-08 NOTE — Telephone Encounter (Signed)
Zilretta is not a "gel" injection, but is a long-acting steroid.  But if he tried it before and it didn't help, it's probably not worth trying again.   Another option would be to inject dextrose solution into the knee (called prolotherapy).  Or still another option would be PRP (platelet-rich plasma) injection, although insurance doesn't cover this one and it's $475.

## 2019-03-09 NOTE — Telephone Encounter (Signed)
Patient wants to try prolotherapy.  I made an appt for Monday 03/14/2019 @ 340pm

## 2019-03-14 ENCOUNTER — Ambulatory Visit (INDEPENDENT_AMBULATORY_CARE_PROVIDER_SITE_OTHER): Payer: Medicare HMO | Admitting: Family Medicine

## 2019-03-14 ENCOUNTER — Other Ambulatory Visit: Payer: Self-pay

## 2019-03-14 ENCOUNTER — Encounter: Payer: Self-pay | Admitting: Family Medicine

## 2019-03-14 DIAGNOSIS — M25562 Pain in left knee: Secondary | ICD-10-CM

## 2019-03-14 DIAGNOSIS — G8929 Other chronic pain: Secondary | ICD-10-CM

## 2019-03-14 NOTE — Progress Notes (Signed)
Office Visit Note   Patient: Tom Phelps           Date of Birth: November 01, 1960           MRN: [id number] Visit Date: 03/14/2019 Requested by: Mila Palmer, MD [address] [place],  [place] [address] PCP: Mila Palmer, MD  Subjective: Chief Complaint  Patient presents with  . Left Knee - Pain, Follow-up    prolotherapy injection    HPI: He is here with persistent left knee pain.  Steroid injection unfortunately has not helped.  In the past he has not had good response to viscosupplementation.               ROS:   All other systems were reviewed and are negative.  Objective: Vital Signs: There were no vitals taken for this visit.  Physical Exam:  General:  Alert and oriented, in no acute distress. Pulm:  Breathing unlabored. Psy:  Normal mood, congruent affect.  Left knee: 1+ effusion with no warmth.  Range of motion 0 to 120 degrees.  Imaging: None today  Assessment & Plan: 1.  Left knee DJD -Discussed options with him and he wants to try dextrose prolotherapy.  He will come back in about 3 weeks for the second of 3 to 5 injections.     Procedures: Left knee prolotherapy injection: After sterile prep with Betadine, injected 6 cc 1% lidocaine without epinephrine and 4 cc of 50% dextrose solution from superolateral approach, a flash of clear yellow synovial fluid was obtained prior to injection.   PMFS History: Patient Active Problem List   Diagnosis Date Noted  . History of TIA (transient ischemic attack) 08/16/2018  . S/P CABG x 2 07/29/2018  . Angina pectoris (HCC)   . Chronic pain of left knee 11/18/2017  . Displaced other extraarticular fracture of right calcaneus, initial encounter for closed fracture 07/13/2017  . Vertigo 12/10/2016   Past Medical History:  Diagnosis Date  . Actinic keratoses   . Ankle dislocation    calcaneal tendons  . Anxiety   . Aortic atherosclerosis (HCC)   . Chronic pain of left knee 11/18/2017  . Coronary  artery disease   . DDD (degenerative disc disease), cervical   . Diabetes mellitus without complication (HCC)    2019  . Displaced other extraarticular fracture of right calcaneus, initial encounter for closed fracture 07/13/2017  . DOE (dyspnea on exertion)   . Dyspnea   . Fatty liver    mild  . Fracture of distal end of right fibula   . Hypercholesteremia   . Hypercholesterolemia   . Hypertension   . Lightheadedness   . Neck pain   . Prediabetes   . Pulmonary nodules   . Stress reaction   . Stroke (HCC)    2018  . TIA (transient ischemic attack)    circulation     Family History  Problem Relation Age of Onset  . CAD Mother   . CAD Father     Past Surgical History:  Procedure Laterality Date  . CORONARY ARTERY BYPASS GRAFT N/A 07/29/2018   Procedure: CORONARY ARTERY BYPASS GRAFTING (CABG) x 2;  Surgeon: Corliss Skains, MD;  Location: [facility] OR;  Service: Open Heart Surgery;  Laterality: N/A;  . ENDOVEIN HARVEST OF GREATER SAPHENOUS VEIN Left 07/29/2018   Procedure: Endovein Harvest Of Greater Saphenous Vein Left Thigh; Exploration only of right leg greater saphenous vein;  Surgeon: Corliss Skains, MD;  Location: [facility] OR;  Service: Open Heart Surgery;  Laterality: Left;  . INTRAVASCULAR PRESSURE WIRE/FFR STUDY N/A 07/20/2018   Procedure: INTRAVASCULAR PRESSURE WIRE/FFR STUDY;  Surgeon: Jettie Booze, MD;  Location: [facility] CV LAB;  Service: Cardiovascular;  Laterality: N/A;  . LEFT HEART CATH AND CORONARY ANGIOGRAPHY N/A 07/20/2018   Procedure: LEFT HEART CATH AND CORONARY ANGIOGRAPHY;  Surgeon: Jettie Booze, MD;  Location: [facility] CV LAB;  Service: Cardiovascular;  Laterality: N/A;  . TEE WITHOUT CARDIOVERSION N/A 07/29/2018   Procedure: TRANSESOPHAGEAL ECHOCARDIOGRAM (TEE);  Surgeon: Lajuana Matte, MD;  Location: [facility];  Service: Open Heart Surgery;  Laterality: N/A;   Social History   Occupational History  . Not on file  Tobacco Use  .  Smoking status: Never Smoker  . Smokeless tobacco: Never Used  Substance and Sexual Activity  . Alcohol use: Never  . Drug use: Never  . Sexual activity: Yes    Comment: married Production assistant, radio)

## 2019-04-06 NOTE — Telephone Encounter (Signed)
disregard

## 2019-04-20 MED ORDER — METOPROLOL TARTRATE 25 MG PO TABS: 25.0000 mg | ORAL_TABLET | Freq: Two times a day (BID) | ORAL | 1 refills | Status: DC

## 2019-05-03 ENCOUNTER — Encounter: Payer: Self-pay | Admitting: Family Medicine

## 2019-05-03 ENCOUNTER — Telehealth: Payer: Self-pay | Admitting: Family Medicine

## 2019-05-03 DIAGNOSIS — M1712 Unilateral primary osteoarthritis, left knee: Secondary | ICD-10-CM

## 2019-05-03 NOTE — Telephone Encounter (Signed)
Patient called stating that he is having some left knee pain.  The injections that he received worked, but the left knee is hurting pretty bad.  He wanted to know his options.  CB#[phone].  Thank you.

## 2019-05-03 NOTE — Telephone Encounter (Signed)
He received dextrose injection on 03/14/19. Please advise.

## 2019-05-03 NOTE — Telephone Encounter (Signed)
The dextrose injection helped very little. He said the knee continues to hurt, especially at night. He has tried taking 1-2 ibuprofen at bedtime - it does help in that he has less pain in the morning. However, he does not want to have to take this long-term. Are there any other suggestions or things to try?   He said it is ok to send him a response through MyChart.

## 2019-05-03 NOTE — Telephone Encounter (Signed)
My Chart message sent

## 2019-05-03 NOTE — Telephone Encounter (Signed)
We did dextrose injection last month.  If it helped some, we can do it again.  Often we'll do 3-5 injections spaced apart by 3-4 weeks.

## 2019-05-05 NOTE — Addendum Note (Signed)
Addended by: Lillia Carmel on: 05/05/2019 07:56 AM   Modules accepted: Orders

## 2019-05-09 ENCOUNTER — Ambulatory Visit (INDEPENDENT_AMBULATORY_CARE_PROVIDER_SITE_OTHER): Payer: Medicare HMO | Admitting: Family Medicine

## 2019-05-09 ENCOUNTER — Encounter: Payer: Self-pay | Admitting: Family Medicine

## 2019-05-09 ENCOUNTER — Other Ambulatory Visit: Payer: Self-pay

## 2019-05-09 DIAGNOSIS — M1712 Unilateral primary osteoarthritis, left knee: Secondary | ICD-10-CM | POA: Diagnosis not present

## 2019-05-09 NOTE — Progress Notes (Signed)
Office Visit Note   Patient: Tom Phelps           Date of Birth: 29-Mar-1960           MRN: [id number] Visit Date: 05/09/2019 Requested by: Jonathon Jordan, MD [address] [place],  [place] [address] PCP: Jonathon Jordan, MD  Subjective: Chief Complaint  Patient presents with  . Left Knee - Pain    Cortisone injection    HPI: He is here with persistent left knee pain.  No improvement with dextrose prolotherapy.  He is very reluctant to undergo knee replacement.  We are waiting for an unloading brace.  He would like to try another cortisone injection.              ROS:   All other systems were reviewed and are negative.  Objective: Vital Signs: There were no vitals taken for this visit.  Physical Exam:  General:  Alert and oriented, in no acute distress. Pulm:  Breathing unlabored. Psy:  Normal mood, congruent affect.  Left knee: 1+ effusion with no warmth.  Full active extension.  Flexion of about 120 degrees.  He has 1+ laxity with valgus stress but still a solid endpoint.  Imaging: No results found.  Assessment & Plan: 1.  Left knee DJD -Reinject with steroid today.  Follow-up as needed.     Procedures: Left knee steroid injection: After sterile prep with Betadine, injected 3 cc 1% lidocaine without epinephrine and 40 mg methylprednisolone from superolateral approach, a flash of clear yellow synovial fluid was obtained prior to injection.    PMFS History: Patient Active Problem List   Diagnosis Date Noted  . History of TIA (transient ischemic attack) 08/16/2018  . S/P CABG x 2 07/29/2018  . Angina pectoris ([facility])   . Chronic pain of left knee 11/18/2017  . Displaced other extraarticular fracture of right calcaneus, initial encounter for closed fracture 07/13/2017  . Vertigo 12/10/2016   Past Medical History:  Diagnosis Date  . Actinic keratoses   . Ankle dislocation    calcaneal tendons  . Anxiety   . Aortic atherosclerosis ([place])   .  Chronic pain of left knee 11/18/2017  . Coronary artery disease   . DDD (degenerative disc disease), cervical   . Diabetes mellitus without complication ([place])    6812  . Displaced other extraarticular fracture of right calcaneus, initial encounter for closed fracture 07/13/2017  . DOE (dyspnea on exertion)   . Dyspnea   . Fatty liver    mild  . Fracture of distal end of right fibula   . Hypercholesteremia   . Hypercholesterolemia   . Hypertension   . Lightheadedness   . Neck pain   . Prediabetes   . Pulmonary nodules   . Stress reaction   . Stroke ([facility])    2018  . TIA (transient ischemic attack)    circulation     Family History  Problem Relation Age of Onset  . CAD Mother   . CAD Father     Past Surgical History:  Procedure Laterality Date  . CORONARY ARTERY BYPASS GRAFT N/A 07/29/2018   Procedure: CORONARY ARTERY BYPASS GRAFTING (CABG) x 2;  Surgeon: Lajuana Matte, MD;  Location: [facility];  Service: Open Heart Surgery;  Laterality: N/A;  . ENDOVEIN HARVEST OF GREATER SAPHENOUS VEIN Left 07/29/2018   Procedure: Endovein Harvest Of Greater Saphenous Vein Left Thigh; Exploration only of right leg greater saphenous vein;  Surgeon: Lajuana Matte,  MD;  [facility];  Service: Open Heart Surgery;  Laterality: Left;  . INTRAVASCULAR PRESSURE WIRE/FFR STUDY N/A 07/20/2018   Procedure: INTRAVASCULAR PRESSURE WIRE/FFR STUDY;  Surgeon: Corky Crafts, MD;  Location: [facility] INVASIVE CV LAB;  Service: Cardiovascular;  Laterality: N/A;  . LEFT HEART CATH AND CORONARY ANGIOGRAPHY N/A 07/20/2018   Procedure: LEFT HEART CATH AND CORONARY ANGIOGRAPHY;  Surgeon: Corky Crafts, MD;  [facility]: [facility] INVASIVE CV LAB;  Service: Cardiovascular;  Laterality: N/A;  . TEE WITHOUT CARDIOVERSION N/A 07/29/2018   Procedure: TRANSESOPHAGEAL ECHOCARDIOGRAM (TEE);  Surgeon: Corliss Skains, MD;  Location: [facility] OR;  Service: Open Heart Surgery;  Laterality: N/A;   Social History    Occupational History  . Not on file  Tobacco Use  . Smoking status: Never Smoker  . Smokeless tobacco: Never Used  Substance and Sexual Activity  . Alcohol use: Never  . Drug use: Never  . Sexual activity: Yes    Comment: married Estate agent)

## 2019-05-16 ENCOUNTER — Encounter: Payer: Self-pay | Admitting: Family Medicine

## 2019-05-17 ENCOUNTER — Other Ambulatory Visit: Payer: Self-pay

## 2019-05-17 MED ORDER — ATORVASTATIN CALCIUM 80 MG PO TABS: 80.0000 mg | ORAL_TABLET | Freq: Every day | ORAL | 4 refills | Status: DC

## 2019-05-26 ENCOUNTER — Encounter: Payer: Self-pay | Admitting: Orthopedic Surgery

## 2019-05-26 ENCOUNTER — Ambulatory Visit (INDEPENDENT_AMBULATORY_CARE_PROVIDER_SITE_OTHER): Payer: Medicare HMO

## 2019-05-26 ENCOUNTER — Ambulatory Visit: Payer: Medicare HMO | Admitting: Orthopedic Surgery

## 2019-05-26 ENCOUNTER — Other Ambulatory Visit: Payer: Self-pay

## 2019-05-26 DIAGNOSIS — M25562 Pain in left knee: Secondary | ICD-10-CM

## 2019-05-26 NOTE — Progress Notes (Signed)
Office Visit Note   Patient: Tom Phelps           Date of Birth: 01-01-1961           MRN: [id number] Visit Date: 05/26/2019 Requested by: Mila Palmer, MD [address] [place],  [place] [address] PCP: Mila Palmer, MD  Subjective: Chief Complaint  Patient presents with  . Left Knee - Pain    HPI: Tom Phelps is a 59 year old patient with left knee pain of several years duration.  Does wake him from sleep at night.  Reports some weakness and giving way.  He has had flex agenic's treatment a year ago which did not help.  Had some gel injections with Dr. Prince Rome which helped him some.  He does not work currently.  Describes global pain in the left knee.  He does walk daily with his wife.  Has a history of right heel injury which forces him to put more pressure on that left leg.              ROS: All systems reviewed are negative as they relate to the chief complaint within the history of present illness.  Patient denies  fevers or chills.   Assessment & Plan: Visit Diagnoses:  1. Left knee pain, unspecified chronicity     Plan: Impression is end-stage left knee arthritis with mild effusion but good alignment and the symptoms which do not really yet warrant total knee replacement.  He is little concerned about total knee replacement because he is known some people who have done well with that but some who have not.  Plan at this time is to continue conservative management.  He is going to forego unloader brace and try knee sleeve instead.  Come back to see Dr. Prince Rome for episodic gel injections.  Continue with nonweightbearing quad strengthening exercises and follow-up as needed.  Follow-Up Instructions: Return if symptoms worsen or fail to improve.   Orders:  Orders Placed This Encounter  Procedures  . XR KNEE 3 VIEW LEFT   No orders of the defined types were placed in this encounter.     Procedures: No procedures performed   Clinical Data: No additional  findings.  Objective: Vital Signs: There were no vitals taken for this visit.  Physical Exam:   Constitutional: Patient appears well-developed HEENT:  Head: Normocephalic Eyes:EOM are normal Neck: Normal range of motion Cardiovascular: Normal rate Pulmonary/chest: Effort normal Neurologic: Patient is alert Skin: Skin is warm Psychiatric: Patient has normal mood and affect    Ortho Exam: Ortho exam demonstrates normal gait alignment with mild effusion in the left knee.  No flexion contracture on the left.  Pedal pulses palpable.  No masses lymphadenopathy or skin changes noted in the left knee region.  Extensor mechanism is intact.  Collateral and cruciate ligaments are stable.    Specialty Comments:  No specialty comments available.  Imaging: XR KNEE 3 VIEW LEFT  Result Date: 05/26/2019 AP lateral merchant left knee reviewed.  End-stage tricompartmental arthritis is present worse in the medial and patellofemoral compartments.  Lateral compartment affected but to a moderate degree.  Mild varus alignment.  No acute fracture.  Bone quality appears intact.  Bone-on-bone changes with some spurring worse in the medial compartment.    PMFS History: Patient Active Problem List   Diagnosis Date Noted  . History of TIA (transient ischemic attack) 08/16/2018  . S/P CABG x 2 07/29/2018  . Angina pectoris ([facility])   . Chronic  pain of left knee 11/18/2017  . Displaced other extraarticular fracture of right calcaneus, initial encounter for closed fracture 07/13/2017  . Vertigo 12/10/2016   Past Medical History:  Diagnosis Date  . Actinic keratoses   . Ankle dislocation    calcaneal tendons  . Anxiety   . Aortic atherosclerosis ([place])   . Chronic pain of left knee 11/18/2017  . Coronary artery disease   . DDD (degenerative disc disease), cervical   . Diabetes mellitus without complication ([place])    9604  . Displaced other extraarticular fracture of right calcaneus, initial encounter  for closed fracture 07/13/2017  . DOE (dyspnea on exertion)   . Dyspnea   . Fatty liver    mild  . Fracture of distal end of right fibula   . Hypercholesteremia   . Hypercholesterolemia   . Hypertension   . Lightheadedness   . Neck pain   . Prediabetes   . Pulmonary nodules   . Stress reaction   . Stroke ([place])    2018  . TIA (transient ischemic attack)    circulation     Family History  Problem Relation Age of Onset  . CAD Mother   . CAD Father     Past Surgical History:  Procedure Laterality Date  . CORONARY ARTERY BYPASS GRAFT N/A 07/29/2018   Procedure: CORONARY ARTERY BYPASS GRAFTING (CABG) x 2;  Surgeon: Lajuana Matte, MD;  Location: [facility];  Service: Open Heart Surgery;  Laterality: N/A;  . ENDOVEIN HARVEST OF GREATER SAPHENOUS VEIN Left 07/29/2018   Procedure: Endovein Harvest Of Greater Saphenous Vein Left Thigh; Exploration only of right leg greater saphenous vein;  Surgeon: Lajuana Matte, MD;  Location: [facility];  Service: Open Heart Surgery;  Laterality: Left;  . INTRAVASCULAR PRESSURE WIRE/FFR STUDY N/A 07/20/2018   Procedure: INTRAVASCULAR PRESSURE WIRE/FFR STUDY;  Surgeon: Jettie Booze, MD;  Location: [facility] CV LAB;  Service: Cardiovascular;  Laterality: N/A;  . LEFT HEART CATH AND CORONARY ANGIOGRAPHY N/A 07/20/2018   Procedure: LEFT HEART CATH AND CORONARY ANGIOGRAPHY;  Surgeon: Jettie Booze, MD;  Location: [facility] CV LAB;  Service: Cardiovascular;  Laterality: N/A;  . TEE WITHOUT CARDIOVERSION N/A 07/29/2018   Procedure: TRANSESOPHAGEAL ECHOCARDIOGRAM (TEE);  Surgeon: Lajuana Matte, MD;  Location: [facility];  Service: Open Heart Surgery;  Laterality: N/A;   Social History   Occupational History  . Not on file  Tobacco Use  . Smoking status: Never Smoker  . Smokeless tobacco: Never Used  Substance and Sexual Activity  . Alcohol use: Never  . Drug use: Never  . Sexual activity: Yes    Comment: married Production assistant, radio)

## 2019-06-02 NOTE — Progress Notes (Signed)
Cardiology Office Note   Date:  06/03/2019   ID:  Tom Phelps, DOB 1960-10-22, MRN [id number]  PCP:  Mila Palmer, MD    No chief complaint on file.  CAD  Wt Readings from Last 3 Encounters:  06/03/19 197 lb (89.4 kg)  11/03/18 195 lb 6.4 oz (88.6 kg)  08/27/18 188 lb (85.3 kg)       History of Present Illness: Tom Phelps is a 59 y.o. male   with history of hypertension, DM, HLD, family history of early CAD, TIA on Plavix. I saw him on 06/2018 complaining of exertional chest pain shortness of breath coronary CT which showed FFR analysis severe stenosis in the mid LAD.He underwent cardiac catheterization 07/20/2018 and was found to have severe diffuse two-vessel disease and was referred for CABG.  Patient underwent CABG times 2-7/23/20 with a LIMA to the LAD and RSVG to OM 2. He had a pleural effusion that was drained. F/u CXR showed no effusion.  Acute superficial venous thrombosis of left lower extremity.  Since the last visit, he has knee pain.  No sx like what he had before CABG.  Denies : Chest pain. Dizziness. Leg edema. Nitroglycerin use. Orthopnea. Palpitations. Paroxysmal nocturnal dyspnea. Shortness of breath. Syncope.   Walking limited by knee pain. May ultimately need TKR, but he is trying to avoid this.   Got his COVID shots without a problem.    Past Medical History:  Diagnosis Date  . Actinic keratoses   . Ankle dislocation    calcaneal tendons  . Anxiety   . Aortic atherosclerosis ([facility])   . Chronic pain of left knee 11/18/2017  . Coronary artery disease   . DDD (degenerative disc disease), cervical   . Diabetes mellitus without complication ([facility])    2019  . Displaced other extraarticular fracture of right calcaneus, initial encounter for closed fracture 07/13/2017  . DOE (dyspnea on exertion)   . Dyspnea   . Fatty liver    mild  . Fracture of distal end of right fibula   . Hypercholesteremia   . Hypercholesterolemia   . Hypertension   .  Lightheadedness   . Neck pain   . Prediabetes   . Pulmonary nodules   . Stress reaction   . Stroke ([facility])    2018  . TIA (transient ischemic attack)    circulation     Past Surgical History:  Procedure Laterality Date  . CORONARY ARTERY BYPASS GRAFT N/A 07/29/2018   Procedure: CORONARY ARTERY BYPASS GRAFTING (CABG) x 2;  Surgeon: Corliss Skains, MD;  [facility];  Service: Open Heart Surgery;  Laterality: N/A;  . ENDOVEIN HARVEST OF GREATER SAPHENOUS VEIN Left 07/29/2018   Procedure: Endovein Harvest Of Greater Saphenous Vein Left Thigh; Exploration only of right leg greater saphenous vein;  Surgeon: Corliss Skains, MD;  [facility];  Service: Open Heart Surgery;  Laterality: Left;  . INTRAVASCULAR PRESSURE WIRE/FFR STUDY N/A 07/20/2018   Procedure: INTRAVASCULAR PRESSURE WIRE/FFR STUDY;  Surgeon: Corky Crafts, MD;  Location: [facility] INVASIVE CV LAB;  Service: Cardiovascular;  Laterality: N/A;  . LEFT HEART CATH AND CORONARY ANGIOGRAPHY N/A 07/20/2018   Procedure: LEFT HEART CATH AND CORONARY ANGIOGRAPHY;  Surgeon: Corky Crafts, MD;  Location: [facility] INVASIVE CV LAB;  Service: Cardiovascular;  Laterality: N/A;  . TEE WITHOUT CARDIOVERSION N/A 07/29/2018   Procedure: TRANSESOPHAGEAL ECHOCARDIOGRAM (TEE);  Surgeon: Corliss Skains, MD;  Location: [facility] OR;  Service: Open Heart Surgery;  Laterality:  N/A;     Current Outpatient Medications  Medication Sig Dispense Refill  . aspirin EC 325 MG EC tablet Take 1 tablet (325 mg total) by mouth every 6 (six) hours as needed. (Patient taking differently: Take 325 mg by mouth daily. )    . atorvastatin (LIPITOR) 80 MG tablet Take 1 tablet (80 mg total) by mouth daily. 30 tablet 4  . clopidogrel (PLAVIX) 75 MG tablet Take 75 mg by mouth daily.    . diclofenac Sodium (VOLTAREN) 1 % GEL Apply 4 g topically 4 (four) times daily as needed. 500 g 6  . escitalopram (LEXAPRO) 10 MG tablet TAKE 1 2 (ONE HALF) TABLET BY MOUTH ONCE  DAILY FOR 7 DAYS THEN TAKE 1 TABLET ONCE DAILY FOR 30 DAYS    . ibuprofen (ADVIL) 800 MG tablet Take 800 mg by mouth 3 (three) times daily as needed.    . metoprolol tartrate (LOPRESSOR) 25 MG tablet Take 1 tablet (25 mg total) by mouth 2 (two) times daily. 180 tablet 1  . traMADol (ULTRAM) 50 MG tablet Take 1 tablet (50 mg total) by mouth every 6 (six) hours as needed for moderate pain. 28 tablet 0   No current facility-administered medications for this visit.    Allergies:   Metformin and related and Oxycodone    Social History:  The patient  reports that he has never smoked. He has never used smokeless tobacco. He reports that he does not drink alcohol or use drugs.   Family History:  [name] family history includes CAD in his father and mother.    ROS:  Please see the history of present illness.   Otherwise, review of systems are positive for knee pain.   All other systems are reviewed and negative.    PHYSICAL EXAM: VS:  BP 110/70   Pulse (!) 51   Ht 5\' 10"  (1.778 m)   Wt 197 lb (89.4 kg)   SpO2 98%   BMI 28.27 kg/m  , BMI Body mass index is 28.27 kg/m. GEN: Well nourished, well developed, in no acute distress  HEENT: normal  Neck: no JVD, carotid bruits, or masses Cardiac: RRR; no murmurs, rubs, or gallops,no edema  Respiratory:  clear to auscultation bilaterally, normal work of breathing GI: soft, nontender, nondistended, + BS MS: no deformity or atrophy  Skin: warm and dry, no rash Neuro:  Strength and sensation are intact Psych: euthymic mood, full affect   EKG:   The ekg ordered today demonstrates sinus bradycardia, nonspecific ST-T wave changes   Recent Labs: 07/28/2018: ALT 10 07/30/2018: Magnesium 2.0 07/31/2018: Hemoglobin 7.8; Platelets 129 08/01/2018: BUN 14; Creatinine, Ser 0.88; Potassium 3.6; Sodium 134   Lipid Panel No results found for: CHOL, TRIG, HDL, CHOLHDL, VLDL, LDLCALC, LDLDIRECT   Other studies Reviewed: Additional studies/ records  that were reviewed today with results demonstrating: PMD labs reviewed.   ASSESSMENT AND PLAN:  1. CAD: Status post CABG.  No angina.  Continue aggressive secondary prevention. Let us know if he hsa any sx like what he had before CABG. Decrease aspirin to 81 mg daily.   2. Hypertension: The current medical regimen is effective;  continue present plan and medications. 3. Hyperlipidemia: LDL 63 in 10/2018. 4. TIA: Continue aggressive secondary prevention including clopidogrel. 5. Borderline sugar: A1C 6.7   Current medicines are reviewed at length with the patient today.  The patient concerns regarding his medicines were addressed.  The following changes have been made:  No change  Labs/ tests ordered today include:  No orders of the defined types were placed in this encounter.   Recommend 150 minutes/week of aerobic exercise Low fat, low carb, high fiber diet recommended  Disposition:   FU in 1 year   Signed, Lance Muss, MD  06/03/2019 1:46 PM    [facility] [address] [place], [place], [place]  [address] Phone: [phone]; Fax: [phone]

## 2019-06-03 ENCOUNTER — Encounter: Payer: Self-pay | Admitting: Interventional Cardiology

## 2019-06-03 ENCOUNTER — Other Ambulatory Visit: Payer: Self-pay

## 2019-06-03 ENCOUNTER — Ambulatory Visit: Payer: Medicare HMO | Admitting: Interventional Cardiology

## 2019-06-03 VITALS — BP 110/70 | HR 51 | Ht 70.0 in | Wt 197.0 lb

## 2019-06-03 DIAGNOSIS — Z8673 Personal history of transient ischemic attack (TIA), and cerebral infarction without residual deficits: Secondary | ICD-10-CM | POA: Diagnosis not present

## 2019-06-03 DIAGNOSIS — I25118 Atherosclerotic heart disease of native coronary artery with other forms of angina pectoris: Secondary | ICD-10-CM

## 2019-06-03 DIAGNOSIS — E782 Mixed hyperlipidemia: Secondary | ICD-10-CM | POA: Diagnosis not present

## 2019-06-03 DIAGNOSIS — Z951 Presence of aortocoronary bypass graft: Secondary | ICD-10-CM | POA: Diagnosis not present

## 2019-06-03 DIAGNOSIS — I10 Essential (primary) hypertension: Secondary | ICD-10-CM | POA: Diagnosis not present

## 2019-06-03 NOTE — Patient Instructions (Signed)
Medication Instructions:  I have reviewed the provider's instructions with the patient, answering all questions to his satisfaction.  STOP aspirin 325 mg START aspirin 81 mg by mouth daily. *If you need a refill on your cardiac medications before your next appointment, please call your pharmacy*   Lab Work: None today If you have labs (blood work) drawn today and your tests are completely normal, you will receive your results only by: Marland Kitchen MyChart Message (if you have MyChart) OR . A paper copy in the mail If you have any lab test that is abnormal or we need to change your treatment, we will call you to review the results.   Testing/Procedures: None today   Follow-Up: At [facility] you and your health needs are our priority.  As part of our continuing mission to provide you with exceptional heart care, we have created designated Provider Care Teams.  These Care Teams include your primary Cardiologist (physician) and Advanced Practice Providers (APPs -  Physician Assistants and Nurse Practitioners) who all work together to provide you with the care you need, when you need it.  We recommend signing up for the patient portal called "MyChart".  Sign up information is provided on this After Visit Summary.  MyChart is used to connect with patients for Virtual Visits (Telemedicine).  Patients are able to view lab/test results, encounter notes, upcoming appointments, etc.  Non-urgent messages can be sent to your provider as well.   To learn more about what you can do with MyChart, go to [email or web address].    Your next appointment:   1 year(s)  The format for your next appointment:   In Person  Provider:   You may see Lance Muss, MD or one of the following Advanced Practice Providers on your designated Care Team:    Ronie Spies, PA-C  Jacolyn Reedy, PA-C

## 2019-07-07 DIAGNOSIS — Z8673 Personal history of transient ischemic attack (TIA), and cerebral infarction without residual deficits: Secondary | ICD-10-CM | POA: Diagnosis not present

## 2019-07-07 DIAGNOSIS — Z791 Long term (current) use of non-steroidal anti-inflammatories (NSAID): Secondary | ICD-10-CM | POA: Diagnosis not present

## 2019-07-07 DIAGNOSIS — Z7982 Long term (current) use of aspirin: Secondary | ICD-10-CM | POA: Diagnosis not present

## 2019-07-07 DIAGNOSIS — Z79899 Other long term (current) drug therapy: Secondary | ICD-10-CM | POA: Diagnosis not present

## 2019-07-07 DIAGNOSIS — M1990 Unspecified osteoarthritis, unspecified site: Secondary | ICD-10-CM | POA: Diagnosis not present

## 2019-07-07 DIAGNOSIS — E119 Type 2 diabetes mellitus without complications: Secondary | ICD-10-CM | POA: Diagnosis not present

## 2019-07-07 DIAGNOSIS — R079 Chest pain, unspecified: Secondary | ICD-10-CM | POA: Diagnosis not present

## 2019-07-07 DIAGNOSIS — Z8249 Family history of ischemic heart disease and other diseases of the circulatory system: Secondary | ICD-10-CM | POA: Diagnosis not present

## 2019-07-07 DIAGNOSIS — I2510 Atherosclerotic heart disease of native coronary artery without angina pectoris: Secondary | ICD-10-CM | POA: Diagnosis not present

## 2019-07-07 DIAGNOSIS — Z951 Presence of aortocoronary bypass graft: Secondary | ICD-10-CM | POA: Diagnosis not present

## 2019-07-07 DIAGNOSIS — K8020 Calculus of gallbladder without cholecystitis without obstruction: Secondary | ICD-10-CM | POA: Diagnosis not present

## 2019-07-07 DIAGNOSIS — K8050 Calculus of bile duct without cholangitis or cholecystitis without obstruction: Secondary | ICD-10-CM | POA: Diagnosis not present

## 2019-07-07 NOTE — Telephone Encounter (Signed)
Called patient regarding his MyChart message. He states that he is currently in the ER. I have advised patient to continue receiving treatment there and call us back after he is discharged if he has any additional concerns.

## 2019-07-10 DIAGNOSIS — R001 Bradycardia, unspecified: Secondary | ICD-10-CM | POA: Diagnosis not present

## 2019-07-22 DIAGNOSIS — K8020 Calculus of gallbladder without cholecystitis without obstruction: Secondary | ICD-10-CM | POA: Diagnosis not present

## 2019-07-22 DIAGNOSIS — E1169 Type 2 diabetes mellitus with other specified complication: Secondary | ICD-10-CM | POA: Diagnosis not present

## 2019-07-22 DIAGNOSIS — R69 Illness, unspecified: Secondary | ICD-10-CM | POA: Diagnosis not present

## 2019-08-02 ENCOUNTER — Other Ambulatory Visit: Payer: Self-pay | Admitting: Interventional Cardiology

## 2019-08-17 DIAGNOSIS — Z01818 Encounter for other preprocedural examination: Secondary | ICD-10-CM | POA: Diagnosis not present

## 2019-08-17 DIAGNOSIS — K8020 Calculus of gallbladder without cholecystitis without obstruction: Secondary | ICD-10-CM | POA: Diagnosis not present

## 2019-10-01 ENCOUNTER — Emergency Department (HOSPITAL_COMMUNITY): Payer: Medicare HMO

## 2019-10-01 ENCOUNTER — Emergency Department (HOSPITAL_COMMUNITY)
Admission: EM | Admit: 2019-10-01 | Discharge: 2019-10-02 | Disposition: A | Discharge status: Home / Self Care | Payer: Medicare HMO | Attending: Emergency Medicine | Admitting: Emergency Medicine

## 2019-10-01 ENCOUNTER — Encounter (HOSPITAL_COMMUNITY): Payer: Self-pay | Admitting: Emergency Medicine

## 2019-10-01 DIAGNOSIS — K8020 Calculus of gallbladder without cholecystitis without obstruction: Secondary | ICD-10-CM | POA: Diagnosis not present

## 2019-10-01 DIAGNOSIS — Z7984 Long term (current) use of oral hypoglycemic drugs: Secondary | ICD-10-CM | POA: Insufficient documentation

## 2019-10-01 DIAGNOSIS — K8050 Calculus of bile duct without cholangitis or cholecystitis without obstruction: Secondary | ICD-10-CM

## 2019-10-01 DIAGNOSIS — Z20822 Contact with and (suspected) exposure to covid-19: Secondary | ICD-10-CM | POA: Diagnosis not present

## 2019-10-01 DIAGNOSIS — R1013 Epigastric pain: Secondary | ICD-10-CM | POA: Insufficient documentation

## 2019-10-01 DIAGNOSIS — R079 Chest pain, unspecified: Secondary | ICD-10-CM | POA: Diagnosis not present

## 2019-10-01 DIAGNOSIS — J90 Pleural effusion, not elsewhere classified: Secondary | ICD-10-CM | POA: Diagnosis not present

## 2019-10-01 DIAGNOSIS — Z7982 Long term (current) use of aspirin: Secondary | ICD-10-CM | POA: Insufficient documentation

## 2019-10-01 DIAGNOSIS — K429 Umbilical hernia without obstruction or gangrene: Secondary | ICD-10-CM | POA: Diagnosis not present

## 2019-10-01 DIAGNOSIS — I10 Essential (primary) hypertension: Secondary | ICD-10-CM | POA: Diagnosis not present

## 2019-10-01 DIAGNOSIS — I2510 Atherosclerotic heart disease of native coronary artery without angina pectoris: Secondary | ICD-10-CM | POA: Insufficient documentation

## 2019-10-01 DIAGNOSIS — I700 Atherosclerosis of aorta: Secondary | ICD-10-CM | POA: Diagnosis not present

## 2019-10-01 DIAGNOSIS — E119 Type 2 diabetes mellitus without complications: Secondary | ICD-10-CM | POA: Insufficient documentation

## 2019-10-01 DIAGNOSIS — R1084 Generalized abdominal pain: Secondary | ICD-10-CM | POA: Diagnosis not present

## 2019-10-01 DIAGNOSIS — Z79899 Other long term (current) drug therapy: Secondary | ICD-10-CM | POA: Insufficient documentation

## 2019-10-01 DIAGNOSIS — K8689 Other specified diseases of pancreas: Secondary | ICD-10-CM | POA: Diagnosis not present

## 2019-10-01 DIAGNOSIS — R0689 Other abnormalities of breathing: Secondary | ICD-10-CM | POA: Diagnosis not present

## 2019-10-01 HISTORY — DX: Calculus of gallbladder without cholecystitis without obstruction: K80.20

## 2019-10-01 LAB — CBC
HCT: 39.7 % (ref 39.0–52.0)
Hemoglobin: 13.7 g/dL (ref 13.0–17.0)
MCH: 30.4 pg (ref 26.0–34.0)
MCHC: 34.5 g/dL (ref 30.0–36.0)
MCV: 88.2 fL (ref 80.0–100.0)
Platelets: 265 10*3/uL (ref 150–400)
RBC: 4.5 MIL/uL (ref 4.22–5.81)
RDW: 13.2 % (ref 11.5–15.5)
WBC: 8.2 10*3/uL (ref 4.0–10.5)
nRBC: 0 % (ref 0.0–0.2)

## 2019-10-01 LAB — RESPIRATORY PANEL BY RT PCR (FLU A&B, COVID)
Influenza A by PCR: NEGATIVE
Influenza B by PCR: NEGATIVE
SARS Coronavirus 2 by RT PCR: NEGATIVE

## 2019-10-01 LAB — COMPREHENSIVE METABOLIC PANEL
ALT: 11 U/L (ref 0–44)
AST: 24 U/L (ref 15–41)
Albumin: 4 g/dL (ref 3.5–5.0)
Alkaline Phosphatase: 78 U/L (ref 38–126)
Anion gap: 10 (ref 5–15)
BUN: 16 mg/dL (ref 8–23)
CO2: 21 mmol/L — ABNORMAL LOW (ref 22–32)
Calcium: 9 mg/dL (ref 8.9–10.3)
Chloride: 103 mmol/L (ref 98–111)
Creatinine, Ser: 1.2 mg/dL (ref 0.61–1.24)
GFR calc Af Amer: >60 mL/min (ref >60)
GFR calc non Af Amer: >60 mL/min (ref >60)
Glucose, Bld: 193 mg/dL — ABNORMAL HIGH (ref 70–99)
Potassium: 3.9 mmol/L (ref 3.5–5.1)
Sodium: 134 mmol/L — ABNORMAL LOW (ref 135–145)
Total Bilirubin: 1 mg/dL (ref 0.3–1.2)
Total Protein: 7.1 g/dL (ref 6.5–8.1)

## 2019-10-01 LAB — LIPASE, BLOOD: Lipase: 26 U/L (ref 11–51)

## 2019-10-01 LAB — TROPONIN I (HIGH SENSITIVITY): Troponin I (High Sensitivity): 3 ng/L (ref <18)

## 2019-10-01 MED ORDER — SODIUM CHLORIDE 0.9 % IV BOLUS
1000.0000 mL | Freq: Once | INTRAVENOUS | Status: AC
  Administered 2019-10-01: 1000 mL via INTRAVENOUS

## 2019-10-01 MED ORDER — ONDANSETRON HCL 4 MG/2ML IJ SOLN
4.0000 mg | Freq: Once | INTRAMUSCULAR | Status: AC
  Administered 2019-10-01: 4 mg via INTRAVENOUS
  Filled 2019-10-01: qty 2

## 2019-10-01 MED ORDER — HYDROMORPHONE HCL 1 MG/ML IJ SOLN
1.0000 mg | Freq: Once | INTRAMUSCULAR | Status: AC
  Administered 2019-10-01: 1 mg via INTRAVENOUS
  Filled 2019-10-01: qty 1

## 2019-10-01 NOTE — ED Provider Notes (Signed)
[facility]-EMERGENCY DEPT Provider Note   CSN: [id number] Arrival date & time: 10/01/19  2041     History Chief Complaint  Patient presents with  . Abdominal Pain    Tom Phelps is a 59 y.o. male.  Patient with hx gallstones, cad/cabg, c/o pain to epigastric area. Symptoms acute onset this evening, at rest, constant, dull, mod-severe, occasionally feels radiating to mid back.  +nausea. No diaphoresis or sob. States unlike cardiac chest pain. Also states is different/worse than prior gallstone related pain. Denies cough or uri symptoms. No fever or chills. No abd distension. No vomiting or diarrhea. Having normal bms. No dysuria or gu c/o. No hx pud or pancreatitis.   The history is provided by the patient and the EMS personnel.  Abdominal Pain Associated symptoms: nausea   Associated symptoms: no chest pain, no chills, no dysuria, no fever, no shortness of breath, no sore throat and no vomiting        Past Medical History:  Diagnosis Date  . Actinic keratoses   . Ankle dislocation    calcaneal tendons  . Anxiety   . Aortic atherosclerosis (HCC)   . Chronic pain of left knee 11/18/2017  . Coronary artery disease   . DDD (degenerative disc disease), cervical   . Diabetes mellitus without complication (HCC)    2019  . Displaced other extraarticular fracture of right calcaneus, initial encounter for closed fracture 07/13/2017  . DOE (dyspnea on exertion)   . Dyspnea   . Fatty liver    mild  . Fracture of distal end of right fibula   . Gallstones   . Hypercholesteremia   . Hypercholesterolemia   . Hypertension   . Lightheadedness   . Neck pain   . Prediabetes   . Pulmonary nodules   . Stress reaction   . Stroke (HCC)    2018  . TIA (transient ischemic attack)    circulation     Patient Active Problem List   Diagnosis Date Noted  . History of TIA (transient ischemic attack) 08/16/2018  . S/P CABG x 2 07/29/2018  . Angina pectoris (HCC)   .  Chronic pain of left knee 11/18/2017  . Displaced other extraarticular fracture of right calcaneus, initial encounter for closed fracture 07/13/2017  . Vertigo 12/10/2016    Past Surgical History:  Procedure Laterality Date  . CORONARY ARTERY BYPASS GRAFT N/A 07/29/2018   Procedure: CORONARY ARTERY BYPASS GRAFTING (CABG) x 2;  Surgeon: Corliss Skains, MD;  Location: [facility] OR;  Service: Open Heart Surgery;  Laterality: N/A;  . ENDOVEIN HARVEST OF GREATER SAPHENOUS VEIN Left 07/29/2018   Procedure: Endovein Harvest Of Greater Saphenous Vein Left Thigh; Exploration only of right leg greater saphenous vein;  Surgeon: Corliss Skains, MD;  Location: [facility] OR;  Service: Open Heart Surgery;  Laterality: Left;  . INTRAVASCULAR PRESSURE WIRE/FFR STUDY N/A 07/20/2018   Procedure: INTRAVASCULAR PRESSURE WIRE/FFR STUDY;  Surgeon: Corky Crafts, MD;  Location: [facility] INVASIVE CV LAB;  Service: Cardiovascular;  Laterality: N/A;  . LEFT HEART CATH AND CORONARY ANGIOGRAPHY N/A 07/20/2018   Procedure: LEFT HEART CATH AND CORONARY ANGIOGRAPHY;  Surgeon: Corky Crafts, MD;  Location: [facility] INVASIVE CV LAB;  Service: Cardiovascular;  Laterality: N/A;  . TEE WITHOUT CARDIOVERSION N/A 07/29/2018   Procedure: TRANSESOPHAGEAL ECHOCARDIOGRAM (TEE);  Surgeon: Corliss Skains, MD;  Location: [facility] OR;  Service: Open Heart Surgery;  Laterality: N/A;       Family History  Problem Relation  Age of Onset  . CAD Mother   . CAD Father     Social History   Tobacco Use  . Smoking status: Never Smoker  . Smokeless tobacco: Never Used  Substance Use Topics  . Alcohol use: Never  . Drug use: Never    Home Medications Prior to Admission medications   Medication Sig Start Date End Date Taking? Authorizing Provider  aspirin EC 81 MG tablet Take 81 mg by mouth daily.    [provider]  atorvastatin (LIPITOR) 80 MG tablet Take 1 tablet by mouth once daily 08/03/19   Corky Crafts, MD    clopidogrel (PLAVIX) 75 MG tablet Take 75 mg by mouth daily.    [provider]  diclofenac Sodium (VOLTAREN) 1 % GEL Apply 4 g topically 4 (four) times daily as needed. 03/07/19   Hilts, Casimiro Needle, MD  escitalopram (LEXAPRO) 10 MG tablet TAKE 1 2 (ONE HALF) TABLET BY MOUTH ONCE DAILY FOR 7 DAYS THEN TAKE 1 TABLET ONCE DAILY FOR 30 DAYS 10/14/18   [provider]  ibuprofen (ADVIL) 800 MG tablet Take 800 mg by mouth 3 (three) times daily as needed. 10/08/18   [provider]  metoprolol tartrate (LOPRESSOR) 25 MG tablet Take 1 tablet (25 mg total) by mouth 2 (two) times daily. 04/20/19   Corky Crafts, MD  traMADol (ULTRAM) 50 MG tablet Take 1 tablet (50 mg total) by mouth every 6 (six) hours as needed for moderate pain. 08/02/18   Rowe Clack, PA-C    Allergies    Metformin and related and Oxycodone  Review of Systems   Review of Systems  Constitutional: Negative for chills and fever.  HENT: Negative for sore throat.   Eyes: Negative for redness.  Respiratory: Negative for shortness of breath.   Cardiovascular: Negative for chest pain.  Gastrointestinal: Positive for abdominal pain and nausea. Negative for vomiting.  Genitourinary: Negative for dysuria and flank pain.  Musculoskeletal: Negative for back pain and neck pain.  Skin: Negative for rash.  Neurological: Negative for headaches.  Hematological: Does not bruise/bleed easily.  Psychiatric/Behavioral: Negative for confusion.    Physical Exam Updated Vital Signs BP (!) 173/96 (BP Location: Right Arm)   Pulse (!) 58   Temp (!) 97.4 F (36.3 C) (Oral)   Resp (!) 26   SpO2 100%   Physical Exam Vitals and nursing note reviewed.  Constitutional:      Appearance: Normal appearance. He is well-developed.  HENT:     Head: Atraumatic.     Nose: Nose normal.     Mouth/Throat:     Mouth: Mucous membranes are moist.     Pharynx: Oropharynx is clear.  Eyes:     General: No scleral icterus.     Conjunctiva/sclera: Conjunctivae normal.  Neck:     Trachea: No tracheal deviation.  Cardiovascular:     Rate and Rhythm: Normal rate and regular rhythm.     Pulses: Normal pulses.     Heart sounds: Normal heart sounds. No murmur heard.  No friction rub. No gallop.   Pulmonary:     Effort: Pulmonary effort is normal. No accessory muscle usage or respiratory distress.     Breath sounds: Normal breath sounds.  Chest:     Chest wall: No tenderness.  Abdominal:     General: Bowel sounds are normal. There is no distension.     Palpations: Abdomen is soft. There is no mass.     Tenderness: There is  abdominal tenderness. There is no guarding or rebound.     Hernia: No hernia is present.     Comments: Epigastric tenderness.   Genitourinary:    Comments: No cva tenderness. Musculoskeletal:        General: No swelling or tenderness.     Cervical back: Normal range of motion and neck supple. No rigidity.     Right lower leg: No edema.     Left lower leg: No edema.  Skin:    General: Skin is warm and dry.     Findings: No rash.  Neurological:     Mental Status: He is alert.     Comments: Alert, speech clear.   Psychiatric:        Mood and Affect: Mood normal.     ED Results / Procedures / Treatments   Labs (all labs ordered are listed, but only abnormal results are displayed) Results for orders placed or performed during the hospital encounter of 10/01/19  CBC  Result Value Ref Range   WBC 8.2 4.0 - 10.5 K/uL   RBC 4.50 4.22 - 5.81 MIL/uL   Hemoglobin 13.7 13.0 - 17.0 g/dL   HCT 33.8 39 - 52 %   MCV 88.2 80.0 - 100.0 fL   MCH 30.4 26.0 - 34.0 pg   MCHC 34.5 30.0 - 36.0 g/dL   RDW 25.0 53.9 - 76.7 %   Platelets 265 150 - 400 K/uL   nRBC 0.0 0.0 - 0.2 %  Comprehensive metabolic panel  Result Value Ref Range   Sodium 134 (L) 135 - 145 mmol/L   Potassium 3.9 3.5 - 5.1 mmol/L   Chloride 103 98 - 111 mmol/L   CO2 21 (L) 22 - 32 mmol/L   Glucose, Bld 193 (H) 70 - 99 mg/dL    BUN 16 8 - 23 mg/dL   Creatinine, Ser 3.41 0.61 - 1.24 mg/dL   Calcium 9.0 8.9 - 93.7 mg/dL   Total Protein 7.1 6.5 - 8.1 g/dL   Albumin 4.0 3.5 - 5.0 g/dL   AST 24 15 - 41 U/L   ALT 11 0 - 44 U/L   Alkaline Phosphatase 78 38 - 126 U/L   Total Bilirubin 1.0 0.3 - 1.2 mg/dL   GFR calc non Af Amer >60 >60 mL/min   GFR calc Af Amer >60 >60 mL/min   Anion gap 10 5 - 15  Lipase, blood  Result Value Ref Range   Lipase 26 11 - 51 U/L  Troponin I (High Sensitivity)  Result Value Ref Range   Troponin I (High Sensitivity) 3 <18 ng/L   DG Chest Port 1 View  Result Date: 10/01/2019 CLINICAL DATA:  Pain EXAM: PORTABLE CHEST 1 VIEW COMPARISON:  10/06/2018 FINDINGS: The patient has undergone prior median sternotomy. Heart size is stable prior study. There is no pneumothorax or large pleural effusion. No focal infiltrate. There is a small rounded density near the left costophrenic angle. There is no acute osseous abnormality. IMPRESSION: 1. No acute cardiopulmonary process. 2. Small rounded density near the left costophrenic angle. This may represent a nipple shadow. Repeat two-view chest x-ray with nipple markers is recommended as an outpatient. Electronically Signed   By: Katherine Mantle M.D.   On: 10/01/2019 21:20    EKG EKG Interpretation  Date/Time:  Saturday October 01 2019 21:21:12 EDT Ventricular Rate:  71 PR Interval:    QRS Duration: 99 QT Interval:  410 QTC Calculation: 446 R Axis:   22 Text Interpretation:  Sinus rhythm Nonspecific T wave abnormality Baseline wander Confirmed by Cathren LaineSteinl, Jeromy Borcherding ([id number]) on 10/01/2019 10:23:25 PM   Radiology DG Chest Port 1 View  Result Date: 10/01/2019 CLINICAL DATA:  Pain EXAM: PORTABLE CHEST 1 VIEW COMPARISON:  10/06/2018 FINDINGS: The patient has undergone prior median sternotomy. Heart size is stable prior study. There is no pneumothorax or large pleural effusion. No focal infiltrate. There is a small rounded density near the left costophrenic  angle. There is no acute osseous abnormality. IMPRESSION: 1. No acute cardiopulmonary process. 2. Small rounded density near the left costophrenic angle. This may represent a nipple shadow. Repeat two-view chest x-ray with nipple markers is recommended as an outpatient. Electronically Signed   By: Katherine Mantlehristopher  Green M.D.   On: 10/01/2019 21:20    Procedures Procedures (including critical care time)  Medications Ordered in ED Medications  sodium chloride 0.9 % bolus 1,000 mL (has no administration in time range)  HYDROmorphone (DILAUDID) injection 1 mg (has no administration in time range)  ondansetron (ZOFRAN) injection 4 mg (has no administration in time range)    ED Course  I have reviewed the triage vital signs and the nursing notes.  Pertinent labs & imaging results that were available during my care of the patient were reviewed by me and considered in my medical decision making (see chart for details).    MDM Rules/Calculators/A&P                         Iv ns bolus. Dilaudid iv. zofran iv. Stat labs. Continuous pulse ox and cardiac monitoring - sinus rhythm.    Reviewed nursing notes and prior charts for additional history.  Prior cabg. Prior gallstones on u/s.   Labs reviewed/interpreted by me - initial trop normal. Lipase normal. lfts normal.   CXR reviewed/interpreted by me - no pna.   Recheck pt, pain improved w meds, but persists. Upper abd tenderness. Will get ct (u/s not available).   CT and additional labs/delta trop pending.   2300 CT pending/delta trop - signed out to Dr Wilkie AyeHorton - will f/u on results, recheck pt, and dispo appropriately.    Final Clinical Impression(s) / ED Diagnoses Final diagnoses:  None    Rx / DC Orders ED Discharge Orders    None       Cathren LaineSteinl, Dondrell Loudermilk, MD 10/01/19 2303

## 2019-10-01 NOTE — ED Triage Notes (Signed)
Pt BIB EMS from home c/o sudden onset of sharp stabbing epigastric pain. Pt was recently dx with gallstones. Pain 10/10. A&O x 4 and ambulatory.   200 mcg fentanyl given by ems  4 mg zofran 18G LAC

## 2019-10-02 ENCOUNTER — Encounter (HOSPITAL_COMMUNITY): Payer: Self-pay

## 2019-10-02 ENCOUNTER — Emergency Department (HOSPITAL_COMMUNITY): Payer: Medicare HMO

## 2019-10-02 DIAGNOSIS — K8020 Calculus of gallbladder without cholecystitis without obstruction: Secondary | ICD-10-CM | POA: Diagnosis not present

## 2019-10-02 DIAGNOSIS — K8689 Other specified diseases of pancreas: Secondary | ICD-10-CM | POA: Diagnosis not present

## 2019-10-02 DIAGNOSIS — K429 Umbilical hernia without obstruction or gangrene: Secondary | ICD-10-CM | POA: Diagnosis not present

## 2019-10-02 DIAGNOSIS — I700 Atherosclerosis of aorta: Secondary | ICD-10-CM | POA: Diagnosis not present

## 2019-10-02 LAB — URINALYSIS, ROUTINE W REFLEX MICROSCOPIC
Bilirubin Urine: NEGATIVE
Glucose, UA: NEGATIVE mg/dL
Hgb urine dipstick: NEGATIVE
Ketones, ur: NEGATIVE mg/dL
Leukocytes,Ua: NEGATIVE
Nitrite: NEGATIVE
Protein, ur: NEGATIVE mg/dL
Specific Gravity, Urine: <1.005 — ABNORMAL LOW (ref 1.005–1.030)
pH: 6.5 (ref 5.0–8.0)

## 2019-10-02 LAB — TROPONIN I (HIGH SENSITIVITY): Troponin I (High Sensitivity): 3 ng/L (ref <18)

## 2019-10-02 MED ORDER — IOHEXOL 300 MG/ML  SOLN
100.0000 mL | Freq: Once | INTRAMUSCULAR | Status: AC | PRN
  Administered 2019-10-02: 100 mL via INTRAVENOUS

## 2019-10-02 MED ORDER — HYDROCODONE-ACETAMINOPHEN 5-325 MG PO TABS: 1.0000 | ORAL_TABLET | Freq: Four times a day (QID) | ORAL | 0 refills | Status: DC | PRN

## 2019-10-02 MED ORDER — ONDANSETRON HCL 4 MG/2ML IJ SOLN
4.0000 mg | Freq: Once | INTRAMUSCULAR | Status: AC
  Administered 2019-10-02: 4 mg via INTRAVENOUS
  Filled 2019-10-02: qty 2

## 2019-10-02 MED ORDER — ONDANSETRON 4 MG PO TBDP: 4.0000 mg | ORAL_TABLET | Freq: Three times a day (TID) | ORAL | 0 refills | Status: DC | PRN

## 2019-10-02 MED ORDER — SODIUM CHLORIDE (PF) 0.9 % IJ SOLN
INTRAMUSCULAR | Status: AC
  Filled 2019-10-02: qty 50

## 2019-10-02 MED ORDER — IBUPROFEN 800 MG PO TABS
800.0000 mg | ORAL_TABLET | Freq: Once | ORAL | Status: AC
  Administered 2019-10-02: 800 mg via ORAL
  Filled 2019-10-02: qty 1

## 2019-10-02 NOTE — ED Provider Notes (Signed)
Patient signed out pending CT scan.  In brief resented with epigastric abdominal pain.  History of gallstones.  He has an appointment with his surgeon on 9/29.  CT scan does show cholelithiasis without evidence of cholecystitis.  There is also some bladder wall thickening.  He is without urinary symptoms.  Urinalysis does not show any evidence of UTI.  Patient was pain controlled and given nausea medication and able to tolerate fluids.  He reports that he is only taking ibuprofen at home.  I have reviewed his narcotic database.  He will be prescribed a short course of pain medication as well as Zofran.  Follow-up with surgeon as planned.  Physical Exam  BP 132/80   Pulse (!) 55   Temp (!) 97.4 F (36.3 C) (Oral)   Resp 13   SpO2 100%   Physical Exam Nontoxic, no acute distress ED Course/Procedures     Procedures  MDM   Problem List Items Addressed This Visit    None    Visit Diagnoses    Biliary colic    -  Primary            Wilkie Aye Mayer Masker, MD 10/02/19 [id number]

## 2019-10-02 NOTE — ED Notes (Signed)
Pt is tolerating sips of water at this time.

## 2019-10-02 NOTE — Discharge Instructions (Addendum)
You were seen today for abdominal pain.  This is likely your gallbladder.  Follow-up with your surgeon as scheduled.  Take medications as prescribed.  Do not drive while taking narcotic medicines.  If you develop worsening pain, fevers, inability to tolerate fluids, you should be reevaluated.

## 2019-10-02 NOTE — ED Notes (Signed)
Pt unable to urinate at this time. Will continue to monitor.

## 2019-10-05 ENCOUNTER — Telehealth: Payer: Self-pay | Admitting: *Deleted

## 2019-10-05 DIAGNOSIS — K8020 Calculus of gallbladder without cholecystitis without obstruction: Secondary | ICD-10-CM | POA: Diagnosis not present

## 2019-10-05 NOTE — Telephone Encounter (Signed)
° °  [facility] Pre-operative Risk Assessment    HEARTCARE STAFF: - Please ensure there is not already an duplicate clearance open for this procedure. - Under Visit Info/Reason for Call, type in Other and utilize the format Clearance MM/DD/YY or Clearance TBD. Do not use dashes or single digits. - If request is for dental extraction, please clarify the # of teeth to be extracted.  Request for surgical clearance:  1. What type of surgery is being performed? LAP CHOLE   2. When is this surgery scheduled? TBD   3. What type of clearance is required (medical clearance vs. Pharmacy clearance to hold med vs. Both)? MEDICAL  4. Are there any medications that need to be held prior to surgery and how long? PLAVIX   5. Practice name and name of physician performing surgery? [facility]; DR. Harrell Gave WHITE   6. What is the office phone number? [phone]   7.   What is the office fax number? [phone] ATTN: JACQUELINE HAGGETT, RMA  8.   Anesthesia type (None, local, MAC, general) ? GENERAL   Tom Phelps 10/05/2019, 5:26 PM  _________________________________________________________________   (provider comments below)

## 2019-10-06 NOTE — Telephone Encounter (Signed)
   Primary Cardiologist: Lance Muss, MD  Chart reviewed as part of pre-operative protocol coverage. Patient was contacted 10/06/2019 in reference to pre-operative risk assessment for pending surgery as outlined below.  Tom Phelps was last seen on 05/2019 by Dr. Eldridge Dace with history of CAD, TIA, DM, HLD, HTN - h/o CABG 07/2018 with pleural effusion requiring draining, SVT of LLE, fatty liver, anxiety, aortic atherosclerosis, pulmonary nodules. EF normal in 07/2018. Patient was seen in the ED 07/2019 with postprandial chest heaviness felt to represent gallstones -> has since had recurrence of biliary colic, pending lap chole. I reached out to patient to discuss any updated symptoms but got VM. LMTCB.  Will route to Dr. Eldridge Dace for input on holding Plavix for this procedure (it does appear he was on Plavix prior to his bypass, possibly due to h/o TIA? Now he is on DAPT.) Dr. Eldridge Dace - Please route response to P CV DIV PREOP (the pre-op pool). Thank you.  Laurann Montana, PA-C 10/06/2019, 8:29 AM

## 2019-10-06 NOTE — Telephone Encounter (Signed)
   Primary Cardiologist: Lance Muss, MD  Chart revisited as part of pre-operative protocol coverage. Patient returned call. He does indicate that ever since his bypass surgery he has continued to have occasional DOE acccompanied by chest pressure occurring with exertion. He says he is not sure if it is his heart or not, because otherwise he feels OK. No rest pain or recent acceleration in symptoms. Based on this, patient requires a formal office visit prior to clearing for surgery. ER precautions reviewed with patient.  Dr. Eldridge Dace can disregard prior message as we will need to evaluate patient before clearing to stop Plavix.  Pre-op covering staff: - Please schedule appointment and call patient to inform them.  - Please contact requesting surgeon's office via preferred method (i.e, phone, fax) to inform them of need for appointment prior to surgery.  Laurann Montana, PA-C 10/06/2019, 9:14 AM

## 2019-10-06 NOTE — Telephone Encounter (Signed)
Patient returning call.

## 2019-10-06 NOTE — Telephone Encounter (Signed)
Pt is agreeable to plan of care for pre op appt. Pt has been scheduled to see Jacolyn Reedy, [facility] 10/11/19 @ 12:45. Pt thanked me for the call and the help. I will send FYI to requesting office pt has appt 10/11/19 for pre op assessment. I will remove from the pre op call back pool, and will forward to PA for upcoming appt.

## 2019-10-10 NOTE — Progress Notes (Addendum)
Cardiology Office Note    Date:  10/11/2019   ID:  Tom Phelps, DOB 09/01/1960, MRN [id number]  PCP:  Tom Palmer, MD  Cardiologist: Lance Muss, MD EPS: None  Chief Complaint  Patient presents with  . Shortness of Breath    History of Present Illness:  Tom Phelps is a 59 y.o. male  history of hypertension, DM, HLD, family history of early CAD, TIA on Plavix.  Patient saw Dr. Eldridge Dace 06/2018 complaining of exertional chest pain shortness of breath coronary CT which showed FFR analysis severe stenosis in the mid LAD.  He underwent cardiac catheterization 07/20/2018 and was found to have severe diffuse two-vessel disease and was referred for CABG.  Patient underwent CABG times 2-7/23/20 with a LIMA to the LAD and RSVG to OM 2.  Patient was last seen by Dr. Eldridge Dace 05/2019 and was doing well without angina.  Aspirin decreased to 81 mg daily.  Patient comes in today for preoperative clearance for laparoscopic cholecystectomy.  When contacted by the preop pool he mentioned having dyspnea on exertion and some chest pressure that is exertional.  Since his surgery when he bends over he gets short of breath. [place].When in [place] he was trying to put things in his camper he had shortness of breath and heaviness in upper abdomen. He went to ER and was told he had a gallstone and needed surgery. Notes in epic and troponins negative. Last Sat he had heaviness in his epigastric region and shortness of breath after eating too much potato salad. All these symptoms are different from when he had angina which was higher in his chest associated with dyspnea. Walks 1-1/2 miles daily-has left knee arthritis limiting him but no chest pain or shortness of breath.     Past Medical History:  Diagnosis Date  . Actinic keratoses   . Ankle dislocation    calcaneal tendons  . Anxiety   . Aortic atherosclerosis (HCC)   . Chronic pain of left knee 11/18/2017  . Coronary  artery disease   . DDD (degenerative disc disease), cervical   . Diabetes mellitus without complication (HCC)    2019  . Displaced other extraarticular fracture of right calcaneus, initial encounter for closed fracture 07/13/2017  . DOE (dyspnea on exertion)   . Dyspnea   . Fatty liver    mild  . Fracture of distal end of right fibula   . Gallstones   . Hypercholesteremia   . Hypercholesterolemia   . Hypertension   . Lightheadedness   . Neck pain   . Prediabetes   . Pulmonary nodules   . Stress reaction   . Stroke (HCC)    2018  . TIA (transient ischemic attack)    circulation     Past Surgical History:  Procedure Laterality Date  . CORONARY ARTERY BYPASS GRAFT N/A 07/29/2018   Procedure: CORONARY ARTERY BYPASS GRAFTING (CABG) x 2;  Surgeon: Corliss Skains, MD;  Location: [facility] OR;  Service: Open Heart Surgery;  Laterality: N/A;  . ENDOVEIN HARVEST OF GREATER SAPHENOUS VEIN Left 07/29/2018   Procedure: Endovein Harvest Of Greater Saphenous Vein Left Thigh; Exploration only of right leg greater saphenous vein;  Surgeon: Corliss Skains, MD;  Location: [facility] OR;  Service: Open Heart Surgery;  Laterality: Left;  . INTRAVASCULAR PRESSURE WIRE/FFR STUDY N/A 07/20/2018   Procedure: INTRAVASCULAR PRESSURE WIRE/FFR STUDY;  Surgeon: Corky Crafts, MD;  Location: [facility] INVASIVE CV LAB;  Service: Cardiovascular;  Laterality: N/A;  . LEFT HEART CATH AND CORONARY ANGIOGRAPHY N/A 07/20/2018   Procedure: LEFT HEART CATH AND CORONARY ANGIOGRAPHY;  Surgeon: Corky Crafts, MD;  Location: [facility] INVASIVE CV LAB;  Service: Cardiovascular;  Laterality: N/A;  . TEE WITHOUT CARDIOVERSION N/A 07/29/2018   Procedure: TRANSESOPHAGEAL ECHOCARDIOGRAM (TEE);  Surgeon: Corliss Skains, MD;  Location: [facility] OR;  Service: Open Heart Surgery;  Laterality: N/A;    Current Medications: Current Meds  Medication Sig  . aspirin EC 81 MG tablet Take 81 mg by mouth daily.  Marland Kitchen atorvastatin (LIPITOR) 80 MG  tablet Take 1 tablet by mouth once daily  . clopidogrel (PLAVIX) 75 MG tablet Take 75 mg by mouth daily.  . diclofenac Sodium (VOLTAREN) 1 % GEL Apply 4 g topically 4 (four) times daily as needed.  Marland Kitchen HYDROcodone-acetaminophen (NORCO/VICODIN) 5-325 MG tablet Take 1-2 tablets by mouth every 6 (six) hours as needed.  Marland Kitchen ibuprofen (ADVIL) 800 MG tablet Take 800 mg by mouth 3 (three) times daily as needed.  . metoprolol tartrate (LOPRESSOR) 25 MG tablet Take 1 tablet (25 mg total) by mouth 2 (two) times daily.  . traMADol (ULTRAM) 50 MG tablet Take 1 tablet (50 mg total) by mouth every 6 (six) hours as needed for moderate pain.     Allergies:   Metformin and related and Oxycodone   Social History   Socioeconomic History  . Marital status: Married    Spouse name: Not on file  . Number of children: Not on file  . Years of education: Not on file  . Highest education level: Not on file  Occupational History  . Not on file  Tobacco Use  . Smoking status: Never Smoker  . Smokeless tobacco: Never Used  Substance and Sexual Activity  . Alcohol use: Never  . Drug use: Never  . Sexual activity: Yes    Comment: married Estate agent)  Other Topics Concern  . Not on file  Social History Narrative  . Not on file   Social Determinants of Health   Financial Resource Strain:   . Difficulty of Paying Living Expenses: Not on file  Food Insecurity:   . Worried About Programme researcher, broadcasting/film/video in the Last Year: Not on file  . Ran Out of Food in the Last Year: Not on file  Transportation Needs:   . Lack of Transportation (Medical): Not on file  . Lack of Transportation (Non-Medical): Not on file  Physical Activity:   . Days of Exercise per Week: Not on file  . Minutes of Exercise per Session: Not on file  Stress:   . Feeling of Stress : Not on file  Social Connections:   . Frequency of Communication with Friends and Family: Not on file  . Frequency of Social Gatherings with Friends and Family: Not on file    . Attends Religious Services: Not on file  . Active Member of Clubs or Organizations: Not on file  . Attends Banker Meetings: Not on file  . Marital Status: Not on file     Family History:  [name] family history includes CAD in his father and mother.   ROS:   Please see the history of present illness.    ROS All other systems reviewed and are negative.   PHYSICAL EXAM:   VS:  BP 118/70   Pulse 65   Ht 5\' 10"  (1.778 m)   Wt 193 lb (87.5 kg)   SpO2 97%   BMI 27.69 kg/m  Physical Exam  GEN: Well nourished, well developed, in no acute distress  Neck: no JVD, carotid bruits, or masses Cardiac:RRR; no murmurs, rubs, or gallops  Respiratory:  clear to auscultation bilaterally, normal work of breathing GI: soft, nontender, nondistended, + BS Ext: without cyanosis, clubbing, or edema, Good distal pulses bilaterally Neuro:  Alert and Oriented x 3 Psych: euthymic mood, full affect  Wt Readings from Last 3 Encounters:  10/11/19 193 lb (87.5 kg)  06/03/19 197 lb (89.4 kg)  11/03/18 195 lb 6.4 oz (88.6 kg)      Studies/Labs Reviewed:   EKG:  EKG is not ordered today.  The ekg reviewed from 10/01/2019 normal sinus rhythm with nonspecific ST-T wave changes, no acute change Recent Labs: 10/01/2019: ALT 11; BUN 16; Creatinine, Ser 1.20; Hemoglobin 13.7; Platelets 265; Potassium 3.9; Sodium 134   Lipid Panel No results found for: CHOL, TRIG, HDL, CHOLHDL, VLDL, LDLCALC, LDLDIRECT  Additional studies/ records that were reviewed today include:  Cardiac catheterization 07/20/2018  Mid RCA lesion is 50% stenosed. Not significant by DFR: 0.99.  2nd Mrg lesion is 90% stenosed.  Dist LM lesion is 40% stenosed.  Mid LAD-1 lesion is 75% stenosed.  Mid LAD-2 lesion is 75% stenosed.  Ost LAD to Prox LAD lesion is 70% stenosed.  Prox LAD to Mid LAD lesion is 50% stenosed.  The left ventricular systolic function is normal.  LV end diastolic pressure is  normal.  The left ventricular ejection fraction is 55-65% by visual estimate.  There is no aortic valve stenosis.  Hiogh bifurcation of radial artery and ulnar artery leading to more spasm. Would not use radial approach in the future if cath was needed.   Severe diffuse 2 vessel CAD.  Diffuse LAD disease and subtotally occluded OM.    Moderate RCA disease, not significant by DFR.    Given diffuse LAD disease including the ostium, I think LIMA to LAD will be a better long term strategy for revascularization.     Plan for outpatient surgery eval.  Once date of surgery is noted, he can hold plavix 5 days prior.    Coronary CTA 6/30/2020IMPRESSION: 1. Severe CAD, CADRADS = 4. Severe lesions in the LAD, and likely >50% lesions in distal left main coronary artery. Severe proximal RCA lesion. Consider coronary angiography. CT FFR will be performed and reported separately.   2. [name] coronary artery calcium score is 937, which places the patient in the 96th percentile for age and sex matched control.   3. Normal coronary origin with right dominance.     Electronically Signed   By: Weston BrassGayatri  Acharya   On: 07/06/2018 16:23   ADDENDUM: Ramus intermedius branch is present, with severe proximal mixed atherosclerotic plaque, 70-99% stenosis. Mild scattered disease in remainder of vessel.     Electronically Signed   By: Weston BrassGayatri  Acharya   On: 07/06/2018 16:27   Addended by Parke PoissonAcharya, Gayatri A, MD on 07/06/2018  4:30 PM   ADDENDUM REPORT: 07/06/2018 16:23   . Left Main:  No significant stenosis. FFR = 0.95   2. LAD: Proximal FFR = 0.81, Mid FFR =0.59, Distal FFR = 0.56 3. Ramus intermedius: FFR = 0.85 4. LCX: No significant stenosis. Proximal FFR = 0.89, Distal FFR = not well mapped 5. RCA: No significant stenosis. Proximal FFR = 0.96, Mid FFR = 0.85, Distal FFR = 0.82   IMPRESSION: 1.  CT FFR analysis showed severe stenosis in the mid LAD.   Consider coronary  angiography  to further evaluate chest pain.     Electronically Signed   By: Weston Brass   On: 07/07/2018 17:53     ASSESSMENT:    1. Preoperative clearance   2. Coronary artery disease involving coronary bypass graft of native heart with angina pectoris (HCC)   3. Essential hypertension   4. Hyperlipidemia, unspecified hyperlipidemia type   5. History of TIA (transient ischemic attack)   6. DOE (dyspnea on exertion)      PLAN:  In order of problems listed above:  Preoperative clearance for laparoscopic cholecystectomy by Dr. Marin Olp with [facility] surgery.  Patient with history of CAD status post CABG just over a year ago.  Has had several episodes of chest tightness and shortness of breath usually after a meal and not exertional related.  He says it is different than his prior angina.  It did prompt 2 emergency room visits which diagnosed him with gallbladder disease.  We will proceed with Lexiscan Myoview prior to surgical clearance According to the Revised Cardiac Risk Index (RCRI), his Perioperative Risk of Major Cardiac Event is (%): 6.6  His Functional Capacity in METs is: 6.27 according to the Duke Activity Status Index (DASI).  Addendum: Dr. Eldridge Dace reviewed the patient's chart and says the patient came proceed with gallbladder surgery without stress testing as he feels the symptoms are related to his gallbladder.  I spoke with the patient who is grateful and we will cancel his Steffanie Dunn and notify [facility] surgery that he can be scheduled for laparoscopic cholecystectomy.  Status post CABG x2-07/29/18 now with some epigastric heaviness and shortness of breath.  Lexiscan Myoview.  Patient has an arthritic left knee and says he probably cannot walk on treadmill.  Essential hypertension blood pressure controlled  Hyperlipidemia LDL 63-10/14/18  History of TIA on Plavix and aspirin    Medication Adjustments/Labs and Tests  Ordered: Current medicines are reviewed at length with the patient today.  Concerns regarding medicines are outlined above.  Medication changes, Labs and Tests ordered today are listed in the Patient Instructions below. Patient Instructions  Medication Instructions:  Your physician recommends that you continue on your current medications as directed. Please refer to the Current Medication list given to you today.  *If you need a refill on your cardiac medications before your next appointment, please call your pharmacy*   Lab Work: None If you have labs (blood work) drawn today and your tests are completely normal, you will receive your results only by: Marland Kitchen MyChart Message (if you have MyChart) OR . A paper copy in the mail If you have any lab test that is abnormal or we need to change your treatment, we will call you to review the results.   Testing/Procedures: Your physician has requested that you have a lexiscan myoview.  Please follow instruction sheet, as given.   Follow-Up: At [facility], you and your health needs are our priority.  As part of our continuing mission to provide you with exceptional heart care, we have created designated Provider Care Teams.  These Care Teams include your primary Cardiologist (physician) and Advanced Practice Providers (APPs -  Physician Assistants and Nurse Practitioners) who all work together to provide you with the care you need, when you need it.  Your next appointment:   Follow up in May 2022  The format for your next appointment:   In Person  Provider:   Everette Rank, MD      Signed, Jacolyn Reedy, PA-C  10/11/2019 1:12 PM    [facility] [address] [place], [place], [place]  [address] Phone: [phone]; Fax: [phone]

## 2019-10-11 ENCOUNTER — Other Ambulatory Visit: Payer: Self-pay

## 2019-10-11 ENCOUNTER — Ambulatory Visit (INDEPENDENT_AMBULATORY_CARE_PROVIDER_SITE_OTHER): Payer: Medicare HMO | Admitting: Physician Assistant

## 2019-10-11 ENCOUNTER — Encounter: Payer: Self-pay | Admitting: Physician Assistant

## 2019-10-11 VITALS — BP 118/70 | HR 65 | Ht 70.0 in | Wt 193.0 lb

## 2019-10-11 DIAGNOSIS — Z01818 Encounter for other preprocedural examination: Secondary | ICD-10-CM

## 2019-10-11 DIAGNOSIS — I25709 Atherosclerosis of coronary artery bypass graft(s), unspecified, with unspecified angina pectoris: Secondary | ICD-10-CM | POA: Diagnosis not present

## 2019-10-11 DIAGNOSIS — Z8673 Personal history of transient ischemic attack (TIA), and cerebral infarction without residual deficits: Secondary | ICD-10-CM | POA: Diagnosis not present

## 2019-10-11 DIAGNOSIS — I10 Essential (primary) hypertension: Secondary | ICD-10-CM | POA: Diagnosis not present

## 2019-10-11 DIAGNOSIS — E785 Hyperlipidemia, unspecified: Secondary | ICD-10-CM | POA: Diagnosis not present

## 2019-10-11 DIAGNOSIS — R0600 Dyspnea, unspecified: Secondary | ICD-10-CM

## 2019-10-11 DIAGNOSIS — R0609 Other forms of dyspnea: Secondary | ICD-10-CM

## 2019-10-11 NOTE — Telephone Encounter (Signed)
Hi Michelle,  I read your note.  Sounds like his sx are more related to his gall bladder, and he is ok with exertion.  I am ok clearing him without stress test. Let me know if you feel strongly otherwise.    JV

## 2019-10-11 NOTE — Patient Instructions (Signed)
Medication Instructions:  Your physician recommends that you continue on your current medications as directed. Please refer to the Current Medication list given to you today.  *If you need a refill on your cardiac medications before your next appointment, please call your pharmacy*   Lab Work: None If you have labs (blood work) drawn today and your tests are completely normal, you will receive your results only by: Marland Kitchen MyChart Message (if you have MyChart) OR . A paper copy in the mail If you have any lab test that is abnormal or we need to change your treatment, we will call you to review the results.   Testing/Procedures: Your physician has requested that you have a lexiscan myoview.  Please follow instruction sheet, as given.   Follow-Up: At [facility], you and your health needs are our priority.  As part of our continuing mission to provide you with exceptional heart care, we have created designated Provider Care Teams.  These Care Teams include your primary Cardiologist (physician) and Advanced Practice Providers (APPs -  Physician Assistants and Nurse Practitioners) who all work together to provide you with the care you need, when you need it.  Your next appointment:   Follow up in May 2022  The format for your next appointment:   In Person  Provider:   Everette Rank, MD

## 2019-10-12 NOTE — Telephone Encounter (Signed)
OK to hold Plavix 5-7 days prior.  JV

## 2019-10-12 NOTE — Telephone Encounter (Signed)
Thank you. I spoke to patient and canceled his stress test. Tom Phelps

## 2019-10-12 NOTE — Telephone Encounter (Signed)
[facility] with [facility] is following up regarding request for patient to hold Plavix. She states she has not heard from our office regarding whether or not patient may hold medication prior to procedure. Please advise.

## 2019-10-12 NOTE — Telephone Encounter (Signed)
   Primary Cardiologist: Lance Muss, MD  Chart reviewed as part of pre-operative protocol coverage. Bird Baller was last seen on 10/11/19 by Jacolyn Reedy.    Dr. Eldridge Dace reviewed the patient's chart and says the patient can proceed with gallbladder surgery without stress testing as he feels the symptoms are related to his gallbladder.  I spoke with the patient who is grateful and we will cancel his Steffanie Dunn and notify [facility] surgery that he can be scheduled for laparoscopic cholecystectomy.  OK to hold plavix 5-7 days prior. Restart as soon after as safe to do so per surgeon.   Therefore, based on ACC/AHA guidelines, the patient would be at acceptable risk for the planned procedure without further cardiovascular testing.    I will route this recommendation to the requesting party via Epic fax function and remove from pre-op pool. Please call with questions.  Roe Rutherford Keiasia Christianson, PA 10/12/2019, 10:52 AM

## 2019-10-12 NOTE — Telephone Encounter (Signed)
[facility] has contacted [place] regarding holding plavix.   OK to hold 5-7 days and continue ASA?

## 2019-10-19 ENCOUNTER — Encounter (HOSPITAL_COMMUNITY): Payer: Medicare HMO

## 2019-11-03 ENCOUNTER — Ambulatory Visit: Payer: Self-pay | Admitting: Surgery

## 2019-11-03 NOTE — H&P (Signed)
CC: Referred by ED For outpatient evalaution of symptomatic cholelithiasis  HPI: [name] is a very pleasant 59yoM with hx of pre-diabetes, carpal tunnel, CAD (s/p CABG last year) who presented to the emergency department twice in the last 2 months for evaluation of midepigastric abdominal pain. He reports episodic attacks of this pain better sharp/Boring pains located in the midepigastrium and right upper quadrant. There are mildly brought on by greasy or fatty foods and helped by avoiding these kinds of foods. He denies any fever/chills/nausea/vomiting. He reports that prior to a couple of months ago, he never had these kinds of pains before. He was seen in emergency department in July and again 10/01/19. He had a CT scan performed of the abdomen and pelvis that demonstrated gallstones without evidence for acute cholecystitis. No biliary ductal dilatation. His LFTs were normal. His Tom Phelps count was normal. His lipase is normal. He was discharged with plans to follow-up in our office. He is here today. He reports stable symptoms reliably brought on by fatty foods. The pain is not clearly radiate. Nothing aside from avoiding these kinds of things helps.  PMH: Pre-DM, CAD (s/p CABG; on plavix), carpal tunnel  PSH: CABG. He denies any abdominal or pelvic surgery.  FHx: Denies FHx of colorectal, breast, endometrial, ovarian or cervical cancer  Social: Denies use of tobacco/EtOH/drugs. He is on disability. Previously worked in Sports coach  ROS: A comprehensive 10 system review of systems was completed with the patient and pertinent findings as noted above.  The patient is a 59 year old male.   Past Surgical History Tom Phelps, [place]; 10/05/2019 3:58 PM) Bypass Surgery for Poor Blood Flow to Legs  Coronary Artery Bypass Graft   Diagnostic Studies History Tom Phelps, [place]; 10/05/2019 3:58 PM) Colonoscopy  never  Allergies Tom Phelps, RMA; 10/05/2019 4:00  PM) metFORMIN HCl *CHEMICALS*  Shortness of breath. oxyCODONE HCl *ANALGESICS - OPIOID*  Itching. Allergies Reconciled   Medication History Tom Phelps, [place]; 10/05/2019 4:03 PM) Metoprolol Tartrate (25MG  Tablet, Oral) Active. Atorvastatin Calcium (80MG  Tablet, Oral) Active. Ibuprofen (800MG  Tablet, Oral) Active. Clopidogrel Bisulfate (75MG  Tablet, Oral) Active. HYDROcodone-Acetaminophen (5-325MG  Tablet, Oral) Active. Aspirin (81MG  Tablet Chewable, Oral) Active. Ondansetron (4MG  Tablet Disint, Oral) Active. Medications Reconciled  Social History , ; 10/05/2019 3:58 PM) Alcohol use  Occasional alcohol use. Caffeine use  Coffee, Tea. No drug use  Tobacco use  Never smoker.  Family History , ; 10/05/2019 3:58 PM) Heart Disease  Father, Mother, Sister. Heart disease in male family member before age 46  Heart disease in male family member before age 97   Other Problems 10/07/2019, Tom Phelps; 10/05/2019 3:58 PM) Arthritis  Cholelithiasis  Diabetes Mellitus  Hypercholesterolemia  Myocardial infarction     Review of Systems 10/07/2019 M. Romey Cohea MD; 10/05/2019 4:39 PM) General Not Present- Appetite Loss, Chills, Fatigue, Fever, Night Sweats, Weight Gain and Weight Loss. Skin Not Present- Change in Wart/Mole, Dryness, Hives, Jaundice, New Lesions, Non-Healing Wounds, Rash and Ulcer. HEENT Not Present- Earache, Hearing Loss, Hoarseness, Nose Bleed, Oral Ulcers, Ringing in the Ears, Seasonal Allergies, Sinus Pain, Sore Throat, Visual Disturbances, Wears glasses/contact lenses and Yellow Eyes. Respiratory Present- Snoring. Not Present- Bloody sputum, Chronic Cough, Difficulty Breathing and Wheezing. Breast Not Present- Breast Mass, Breast Pain, Nipple Discharge and Skin Changes. Cardiovascular Present- Shortness of Breath. Not Present- Chest Pain, Difficulty Breathing Lying Down, Leg Cramps, Palpitations, Rapid Heart Rate and Swelling  of Extremities. Gastrointestinal Not Present- Abdominal Pain, Bloating, Bloody Stool, Change  in Bowel Habits, Chronic diarrhea, Constipation, Difficulty Swallowing, Excessive gas, Gets full quickly at meals, Hemorrhoids, Indigestion, Nausea, Rectal Pain and Vomiting. Male Genitourinary Not Present- Blood in Urine, Change in Urinary Stream, Frequency, Impotence, Nocturia, Painful Urination, Urgency and Urine Leakage. Hematology Present- Blood Thinners. Not Present- Blood Clots.  Vitals Tom Phelps RMA; 10/05/2019 4:03 PM) 10/05/2019 4:03 PM Weight: 196.38 lb Height: 70in Body Surface Area: 2.07 m Body Mass Index: 28.18 kg/m  Temp.: 97.38F  Pulse: 60 (Regular)  P.OX: 97% (Room air) BP: 120/80(Sitting, Left Arm, Standard)       Physical Exam [place] M. Dejay Kronk MD; 10/05/2019 4:40 PM) The physical exam findings are as follows: Note: Constitutional: No acute distress; conversant; no deformities Eyes: Moist conjunctiva; no lid lag; anicteric sclerae; pupils equal and round Lungs: Normal respiratory effort CV: rrr; no palpable thrill; no pitting edema GI: Abdomen soft, nontender, nondistended; no palpable hepatosplenomegaly. Negative Murphy's sign MSK: Normal gait; no clubbing/cyanosis Psychiatric: Appropriate affect; alert and oriented 3 Lymphatic: No palpable cervical or axillary lymphadenopathy    Assessment & Plan Tom [place]er M. Pranika Finks MD; 10/05/2019 4:42 PM) SYMPTOMATIC CHOLELITHIASIS (K80.20) Story: [name] is a very pleasant 59yoM with hx pre-DM, CAD (s/p CABG, on plavix) here for evaluation of symptomatic cholelithiasis Impression: -The anatomy and physiology of the hepatobiliary system was discussed at length with the patient with associated pictures. The pathophysiology of gallbladder disease was discussed at length with associated pictures. -The options for treatment were discussed including ongoing observation which may result in subsequent gallbladder  complications (infection, pancreatitis, choledocholithiasis, etc) and surgery - cholecystectomy - laparoscopic and potential open techniques. We also reviewed scenarios where subtotal cholecystectomy could be indicated. -The planned procedures, material risks (including, but not limited to, pain, bleeding, infection, scarring, need for blood transfusion, damage to surrounding structures- blood vessels/nerves/viscus/organs, damage to bile duct, bile leak, need for additional procedures, hernia, pancreatitis, pneumonia, heart attack, stroke, death) benefits and alternatives to surgery were discussed at length. We noted a good probability that the procedure would help improve his symptoms. [name] questions were answered to his satisfaction, he voiced understanding and they elected to proceed with surgery. Additionally, we discussed typical postoperative expectations and the recovery process. -Cardiac clearance being requested - plans to hold plavix at least 5 days before surgery if possible  This patient encounter took 35 minutes today to perform the following: take history, perform exam, review outside records, interpret imaging, counsel the patient on their diagnosis and document encounter, findings & plan in the EHR  Signed by Tom Meuse, MD (10/05/2019 4:45 PM)

## 2019-11-07 ENCOUNTER — Other Ambulatory Visit (HOSPITAL_COMMUNITY): Payer: Medicare HMO

## 2019-11-08 DIAGNOSIS — E119 Type 2 diabetes mellitus without complications: Secondary | ICD-10-CM | POA: Diagnosis not present

## 2019-11-10 ENCOUNTER — Ambulatory Visit: Admit: 2019-11-10 | Payer: Medicare HMO | Admitting: Surgery

## 2019-11-10 SURGERY — LAPAROSCOPIC CHOLECYSTECTOMY
Anesthesia: General | Laterality: Left

## 2019-11-14 ENCOUNTER — Other Ambulatory Visit: Payer: Self-pay | Admitting: Interventional Cardiology

## 2019-11-14 NOTE — Telephone Encounter (Signed)
rx refill

## 2019-12-12 ENCOUNTER — Encounter (HOSPITAL_COMMUNITY): Payer: Self-pay

## 2019-12-12 NOTE — Progress Notes (Signed)
PCP - Mila Palmer, MD Cardiologist - clearance 10-12-19 epic Micah Flesher, PA . Dr.  Varney Daily, MD  PPM/ICD -  Device Orders -  Rep Notified -   Chest x-ray - 1 view 10-01-19 epic EKG - 10-01-19 epic Stress Test -  ECHO - 07-31-18 epic Cardiac Cath - 07-20-2018 epic  Sleep Study -  CPAP -   Fasting Blood Sugar -  Checks Blood Sugar _____ times a day  Blood Thinner Instructions:Plavix hold 5-7- days prior Aspirin Instructions:  ERAS Protcol - PRE-SURGERY Ensure or G2-   COVID TEST- 12-14  Activity-- Anesthesia review: CAD, CABG 2020, TIA, Dm no meds  Patient denies shortness of breath, fever, cough and chest pain at PAT appointment   All instructions explained to the patient, with a verbal understanding of the material. Patient agrees to go over the instructions while at home for a better understanding. Patient also instructed to self quarantine after being tested for COVID-19. The opportunity to ask questions was provided.

## 2019-12-12 NOTE — Patient Instructions (Addendum)
DUE TO COVID-19 ONLY ONE VISITOR IS ALLOWED TO COME WITH YOU AND STAY IN THE WAITING ROOM ONLY DURING PRE OP AND PROCEDURE DAY OF SURGERY. THE 1 VISITOR  MAY VISIT WITH YOU AFTER SURGERY IN YOUR PRIVATE ROOM DURING VISITING HOURS ONLY!  YOU NEED TO HAVE A COVID 19 TEST ON_   12-14-21______ @_10 :30______, THIS TEST MUST BE DONE BEFORE SURGERY,  COVID TESTING SITE [address] [place] , IT IS ON THE RIGHT GOING OUT [address] APPROXIMATELY  2 MINUTES PAST [employer] ON THE RIGHT. ONCE YOUR COVID TEST IS COMPLETED,  PLEASE BEGIN THE QUARANTINE INSTRUCTIONS AS OUTLINED IN YOUR HANDOUT.                Tom Phelps    Your procedure is scheduled on: 12-23-19   Report to [facility]  Entrance   Report to short stay  at        0530  AM     Call this number if you have problems the morning of surgery [phone]    Remember: Do not eat food  :After Midnight. You may have clear liquids until 0430 am then nothing by mouth    CLEAR LIQUID DIET   Foods Allowed                                                                       Coffee and tea, regular and decaf                              Plain Jell-O any favor except red or purple                                            Fruit ices (not with fruit pulp)                                      Iced Popsicles                                   Carbonated beverages, regular and diet                                    Cranberry, grape and apple juices Sports drinks like Gatorade Lightly seasoned clear broth or consume(fat free) Sugar, honey syrup  _____________________________________________________________________    BRUSH YOUR TEETH MORNING OF SURGERY AND RINSE YOUR MOUTH OUT, NO CHEWING GUM CANDY OR MINTS.     Take these medicines the morning of surgery with A SIP OF WATER: metoprolol, lipitor, hydrocodone if needed                                 You may not have any metal on your  body including hair pins and  piercings  Do not wear jewelry,  lotions, powders or perfumes, deodorant                       Men may shave face and neck.   Do not bring valuables to the hospital. [facility] IS NOT             RESPONSIBLE   FOR VALUABLES.  Contacts, dentures or bridgework may not be worn into surgery.      Patients discharged the day of surgery will not be allowed to drive home. IF YOU ARE HAVING SURGERY AND GOING HOME THE SAME DAY, YOU MUST HAVE AN ADULT TO DRIVE YOU HOME AND BE WITH YOU FOR 24 HOURS. YOU MAY GO HOME BY TAXI OR UBER OR ORTHERWISE, BUT AN ADULT MUST ACCOMPANY YOU HOME AND STAY WITH YOU FOR 24 HOURS.  Name and phone number of your driver:  Special Instructions: N/A              Please read over the following fact sheets you were given: _____________________________________________________________________             [facility] - Preparing for Surgery Before surgery, you can play an important role.  Because skin is not sterile, your skin needs to be as free of germs as possible.  You can reduce the number of germs on your skin by washing with CHG (chlorahexidine gluconate) soap before surgery.  CHG is an antiseptic cleaner which kills germs and bonds with the skin to continue killing germs even after washing. Please DO NOT use if you have an allergy to CHG or antibacterial soaps.  If your skin becomes reddened/irritated stop using the CHG and inform your nurse when you arrive at Short Stay. Do not shave (including legs and underarms) for at least 48 hours prior to the first CHG shower.  You may shave your face/neck. Please follow these instructions carefully:  1.  Shower with CHG Soap the night before surgery and the  morning of Surgery.  2.  If you choose to wash your hair, wash your hair first as usual with your  normal  shampoo.  3.  After you shampoo, rinse your hair and body thoroughly to remove the  shampoo.                                         4.  Use CHG as you would any other liquid soap.  You can apply chg directly  to the skin and wash                       Gently with a scrungie or clean washcloth.  5.  Apply the CHG Soap to your body ONLY FROM THE NECK DOWN.   Do not use on face/ open                           Wound or open sores. Avoid contact with eyes, ears mouth and genitals (private parts).                       Wash face,  Genitals (private parts) with your normal soap.             6.  Wash thoroughly, paying special attention to the area where your  surgery  will be performed.  7.  Thoroughly rinse your body with warm water from the neck down.  8.  DO NOT shower/wash with your normal soap after using and rinsing off  the CHG Soap.                9.  Pat yourself dry with a clean towel.            10.  Wear clean pajamas.            11.  Place clean sheets on your bed the night of your first shower and do not  sleep with pets. Day of Surgery : Do not apply any lotions/deodorants the morning of surgery.  Please wear clean clothes to the hospital/surgery center.  FAILURE TO FOLLOW THESE INSTRUCTIONS MAY RESULT IN THE CANCELLATION OF YOUR SURGERY PATIENT SIGNATURE_________________________________  NURSE SIGNATURE__________________________________  ________________________________________________________________________

## 2019-12-13 ENCOUNTER — Encounter (HOSPITAL_COMMUNITY)
Admission: RE | Admit: 2019-12-13 | Discharge: 2019-12-13 | Disposition: A | Discharge status: Home / Self Care | Payer: Medicare HMO | Source: Ambulatory Visit | Attending: Surgery | Admitting: Surgery

## 2019-12-13 ENCOUNTER — Other Ambulatory Visit: Payer: Self-pay

## 2019-12-13 ENCOUNTER — Encounter (HOSPITAL_COMMUNITY): Payer: Self-pay

## 2019-12-13 DIAGNOSIS — K8080 Other cholelithiasis without obstruction: Secondary | ICD-10-CM | POA: Diagnosis not present

## 2019-12-13 DIAGNOSIS — I2510 Atherosclerotic heart disease of native coronary artery without angina pectoris: Secondary | ICD-10-CM | POA: Diagnosis not present

## 2019-12-13 DIAGNOSIS — Z79899 Other long term (current) drug therapy: Secondary | ICD-10-CM | POA: Insufficient documentation

## 2019-12-13 DIAGNOSIS — Z8673 Personal history of transient ischemic attack (TIA), and cerebral infarction without residual deficits: Secondary | ICD-10-CM | POA: Insufficient documentation

## 2019-12-13 DIAGNOSIS — I10 Essential (primary) hypertension: Secondary | ICD-10-CM | POA: Diagnosis not present

## 2019-12-13 DIAGNOSIS — Z7982 Long term (current) use of aspirin: Secondary | ICD-10-CM | POA: Insufficient documentation

## 2019-12-13 DIAGNOSIS — Z01812 Encounter for preprocedural laboratory examination: Secondary | ICD-10-CM | POA: Diagnosis present

## 2019-12-13 DIAGNOSIS — Z7902 Long term (current) use of antithrombotics/antiplatelets: Secondary | ICD-10-CM | POA: Diagnosis not present

## 2019-12-13 DIAGNOSIS — E119 Type 2 diabetes mellitus without complications: Secondary | ICD-10-CM | POA: Diagnosis not present

## 2019-12-13 HISTORY — DX: Personal history of urinary calculi: Z87.442

## 2019-12-13 HISTORY — DX: Myoneural disorder, unspecified: G70.9

## 2019-12-13 LAB — CBC WITH DIFFERENTIAL/PLATELET
Abs Immature Granulocytes: 0.02 10*3/uL (ref 0.00–0.07)
Basophils Absolute: 0.1 10*3/uL (ref 0.0–0.1)
Basophils Relative: 1 %
Eosinophils Absolute: 0.2 10*3/uL (ref 0.0–0.5)
Eosinophils Relative: 3 %
HCT: 43.2 % (ref 39.0–52.0)
Hemoglobin: 14.3 g/dL (ref 13.0–17.0)
Immature Granulocytes: 0 %
Lymphocytes Relative: 33 %
Lymphs Abs: 2.1 10*3/uL (ref 0.7–4.0)
MCH: 30 pg (ref 26.0–34.0)
MCHC: 33.1 g/dL (ref 30.0–36.0)
MCV: 90.8 fL (ref 80.0–100.0)
Monocytes Absolute: 0.5 10*3/uL (ref 0.1–1.0)
Monocytes Relative: 8 %
Neutro Abs: 3.6 10*3/uL (ref 1.7–7.7)
Neutrophils Relative %: 55 %
Platelets: 244 10*3/uL (ref 150–400)
RBC: 4.76 MIL/uL (ref 4.22–5.81)
RDW: 13.6 % (ref 11.5–15.5)
WBC: 6.4 10*3/uL (ref 4.0–10.5)
nRBC: 0 % (ref 0.0–0.2)

## 2019-12-13 LAB — HEMOGLOBIN A1C
Hgb A1c MFr Bld: 6.8 % — ABNORMAL HIGH (ref 4.8–5.6)
Mean Plasma Glucose: 148.46 mg/dL

## 2019-12-13 LAB — COMPREHENSIVE METABOLIC PANEL
ALT: 13 U/L (ref 0–44)
AST: 17 U/L (ref 15–41)
Albumin: 4.2 g/dL (ref 3.5–5.0)
Alkaline Phosphatase: 73 U/L (ref 38–126)
Anion gap: 8 (ref 5–15)
BUN: 17 mg/dL (ref 8–23)
CO2: 25 mmol/L (ref 22–32)
Calcium: 9.2 mg/dL (ref 8.9–10.3)
Chloride: 103 mmol/L (ref 98–111)
Creatinine, Ser: 1.04 mg/dL (ref 0.61–1.24)
GFR, Estimated: >60 mL/min (ref >60)
Glucose, Bld: 142 mg/dL — ABNORMAL HIGH (ref 70–99)
Potassium: 4.3 mmol/L (ref 3.5–5.1)
Sodium: 136 mmol/L (ref 135–145)
Total Bilirubin: 0.7 mg/dL (ref 0.3–1.2)
Total Protein: 6.8 g/dL (ref 6.5–8.1)

## 2019-12-13 LAB — GLUCOSE, CAPILLARY: Glucose-Capillary: 160 mg/dL — ABNORMAL HIGH (ref 70–99)

## 2019-12-13 LAB — PROTIME-INR
INR: 1 (ref 0.8–1.2)
Prothrombin Time: 12.9 seconds (ref 11.4–15.2)

## 2019-12-13 LAB — APTT: aPTT: 26 seconds (ref 24–36)

## 2019-12-13 NOTE — Progress Notes (Signed)
PCP - Mila Palmer, MD Cardiologist - clearance 10-12-19 epic Micah Flesher, PA . Dr.  Varney Daily, MD  PPM/ICD - NA Device Orders -  Rep Notified -   Chest x-ray - 1 view 10-01-19 epic EKG - 10-01-19 epic Stress Test -  ECHO - 07-31-18 epic Cardiac Cath - 07-20-2018 epic  Sleep Study - no CPAP -   Fasting Blood Sugar - Pt doesn't remember Checks Blood Sugar _____ times a day 1-2 times a month  Blood Thinner Instructions:Plavix hold 5-7- days prior Aspirin Instructions:last dose 12/17/19  ERAS Protcol - PRE-SURGERY Ensure or G2- NA  COVID TEST- 12-14  Activity-- Anesthesia review: CAD, CABG 2020, TIA, Dm no meds  Patient denies shortness of breath, fever, cough and chest pain at PAT appointment yes  All instructions explained to the patient, with a verbal understanding of the material. Patient agrees to go over the instructions while at home for a better understanding. Patient also instructed to self quarantine after being tested for COVID-19. The opportunity to ask questions was provided. Yes Pt reports feeling SOB when bending over or standing up. He had a bad episode in may 2021 while travelling.

## 2019-12-16 ENCOUNTER — Encounter (HOSPITAL_COMMUNITY): Payer: Self-pay | Admitting: Physician Assistant

## 2019-12-16 ENCOUNTER — Encounter (HOSPITAL_COMMUNITY): Payer: Self-pay | Admitting: Anesthesiology

## 2019-12-16 NOTE — Anesthesia Preprocedure Evaluation (Deleted)
Anesthesia Evaluation    Reviewed: Allergy & Precautions, Patient's Chart, lab work & pertinent test results  History of Anesthesia Complications Negative for: history of anesthetic complications  Airway        Dental   Pulmonary neg pulmonary ROS,           Cardiovascular METS: 5 - 7 Mets hypertension, Pt. on medications and Pt. on home beta blockers + CAD and + CABG       Neuro/Psych TIAnegative psych ROS   GI/Hepatic negative GI ROS, Neg liver ROS,   Endo/Other  diabetes, Type 2  Renal/GU negative Renal ROS  negative genitourinary   Musculoskeletal negative musculoskeletal ROS (+)   Abdominal   Peds  Hematology Plavix   Anesthesia Other Findings  TIA (on Plavix), HTN, HLD, CAD s/p CABGx2 (07/20/18), DM  Echo 06/21/18: EF 50-55%, mild LVH, normal RV function, unremarkable valves  Per cardiology preoperative risk assessment 10/12/2019: "Chart reviewed as part of pre-operative protocol coverage.Obryan Namiwas last seen on 10/5/21by Jacolyn Reedy. Dr. Suzanne Boron the patient's chart and says the patient canproceed with gallbladder surgery without stress testing as he feels the symptoms are related to his gallbladder. I spoke with the patient who is grateful and we will cancel his Steffanie Dunn and notify [facility] surgery that he can be scheduled for laparoscopic cholecystectomy. OK to hold plavix 5-7 days prior. Restart as soon after as safe to do so per surgeon.Therefore, based on ACC/AHA guidelines, the patient would be at acceptable risk for the planned procedure without further cardiovascular testing."  Reproductive/Obstetrics                            Anesthesia Physical Anesthesia Plan  ASA: III  Anesthesia Plan: General   Post-op Pain Management:    Induction: Intravenous  PONV Risk Score and Plan: 3 and Ondansetron, Dexamethasone, Treatment may vary due to age or  medical condition and Midazolam  Airway Management Planned: Oral ETT  Additional Equipment: None  Intra-op Plan:   Post-operative Plan: Extubation in OR  Informed Consent:   Plan Discussed with:   Anesthesia Plan Comments: ( PROCEDURE CANCELLED BY SURGEON BECAUSE PATIENT DID NOT HOLD PLAVIX.)      Anesthesia Quick Evaluation

## 2019-12-16 NOTE — Progress Notes (Signed)
Anesthesia Chart Review   Case: [id number] Date/Time: 12/23/19 0715   Procedure: LAPAROSCOPIC CHOLECYSTECTOMY (Left )   Anesthesia type: General   Pre-op diagnosis: SYMPTOMATIC CHOLELITHIASIS   Location: [facility] ROOM 01 / [facility]   Surgeons: Andria Meuse, MD      DISCUSSION:59 y.o. never smoker with h/o HTN, DM II, symptomatic cholelithiasis scheduled for above procedure 12/23/2019 with Dr. Marin Olp.   Per cardiology preoperative risk assessment 10/12/2019, "Chart reviewed as part of pre-operative protocol coverage. Tom Phelps was last seen on 10/11/19 by Jacolyn Reedy.   Dr. Suzanne Boron the patient's chart and says the patient can proceed with gallbladder surgery without stress testing as he feels the symptoms are related to his gallbladder. I spoke with the patient who is grateful and we will cancel his Steffanie Dunn and notify [facility] surgery that he can be scheduled for laparoscopic cholecystectomy. OK to hold plavix 5-7 days prior. Restart as soon after as safe to do so per surgeon.  Therefore, based on ACC/AHA guidelines, the patient would be at acceptable risk for the planned procedure without further cardiovascular testing."  Anticipate pt can proceed with planned procedure barring acute status change.    VS: BP (!) 151/82   Pulse (!) 56   Temp 36.6 C (Oral)   Resp 18   Ht 5\' 10"  (1.778 m)   Wt 91.2 kg   SpO2 100%   BMI 28.85 kg/m   PROVIDERS: , MD is PCP   Mila Palmer, MD is Cardiologist  LABS: Labs reviewed: Acceptable for surgery. (all labs ordered are listed, but only abnormal results are displayed)  Labs Reviewed  HEMOGLOBIN A1C - Abnormal; Notable for the following components:      Result Value   Hgb A1c MFr Bld 6.8 (*)    All other components within normal limits  COMPREHENSIVE METABOLIC PANEL - Abnormal; Notable for the following components:   Glucose, Bld 142 (*)    All other components within normal limits   GLUCOSE, CAPILLARY - Abnormal; Notable for the following components:   Glucose-Capillary 160 (*)    All other components within normal limits  APTT  CBC WITH DIFFERENTIAL/PLATELET  PROTIME-INR  TYPE AND SCREEN     IMAGES:   EKG: 10/01/2019 Rate 71 bpm  Sinus rhythm  Nonspecific T wave abnormality  Baseline wander  CV: Echo 06/21/2018 IMPRESSIONS    1. The left ventricle has low normal systolic function, with an ejection  fraction of 50-55%. The cavity size was normal. There is mildly increased  left ventricular wall thickness. Left ventricular diastolic parameters  were normal.  2. The right ventricle has normal systolic function. The cavity was  normal. There is no increase in right ventricular wall thickness.  3. Mild thickening of the mitral valve leaflet.  4. The aortic valve is tricuspid. Mild thickening of the aortic valve.  Mild calcification of the aortic valve Past Medical History:  Diagnosis Date  . Actinic keratoses   . Ankle dislocation    calcaneal tendons  . Anxiety   . Aortic atherosclerosis (HCC)   . Chronic pain of left knee 11/18/2017  . Coronary artery disease   . DDD (degenerative disc disease), cervical   . Diabetes mellitus without complication (HCC) 2019   no meds  . Displaced other extraarticular fracture of right calcaneus, initial encounter for closed fracture 07/13/2017  . DOE (dyspnea on exertion)   . Dyspnea   . Fatty liver    mild  . Fracture  of distal end of right fibula   . Gallstones   . History of kidney stones   . Hypercholesteremia   . Hypercholesterolemia   . Hypertension   . Lightheadedness   . Neck pain    due to work   . Neuromuscular disorder (HCC)    carpal tunnel bi lat  . Prediabetes   . Pulmonary nodules   . Stress reaction   . Stroke (HCC)    2018 no residuals  . TIA (transient ischemic attack)    circulation     Past Surgical History:  Procedure Laterality Date  . BACK SURGERY  2005   L4-L5  fusion  . CORONARY ARTERY BYPASS GRAFT N/A 07/29/2018   Procedure: CORONARY ARTERY BYPASS GRAFTING (CABG) x 2;  Surgeon: Corliss Skains, MD;  Location: [facility] OR;  Service: Open Heart Surgery;  Laterality: N/A;  . ENDOVEIN HARVEST OF GREATER SAPHENOUS VEIN Left 07/29/2018   Procedure: Endovein Harvest Of Greater Saphenous Vein Left Thigh; Exploration only of right leg greater saphenous vein;  Surgeon: Corliss Skains, MD;  Location: [facility] OR;  Service: Open Heart Surgery;  Laterality: Left;  . INTRAVASCULAR PRESSURE WIRE/FFR STUDY N/A 07/20/2018   Procedure: INTRAVASCULAR PRESSURE WIRE/FFR STUDY;  Surgeon: Corky Crafts, MD;  Location: [facility] INVASIVE CV LAB;  Service: Cardiovascular;  Laterality: N/A;  . LEFT HEART CATH AND CORONARY ANGIOGRAPHY N/A 07/20/2018   Procedure: LEFT HEART CATH AND CORONARY ANGIOGRAPHY;  Surgeon: Corky Crafts, MD;  Location: [facility] INVASIVE CV LAB;  Service: Cardiovascular;  Laterality: N/A;  . TEE WITHOUT CARDIOVERSION N/A 07/29/2018   Procedure: TRANSESOPHAGEAL ECHOCARDIOGRAM (TEE);  Surgeon: Corliss Skains, MD;  Location: [facility] OR;  Service: Open Heart Surgery;  Laterality: N/A;    MEDICATIONS: . aspirin EC 81 MG tablet  . atorvastatin (LIPITOR) 80 MG tablet  . clopidogrel (PLAVIX) 75 MG tablet  . diclofenac Sodium (VOLTAREN) 1 % GEL  . HYDROcodone-acetaminophen (NORCO/VICODIN) 5-325 MG tablet  . ibuprofen (ADVIL) 800 MG tablet  . metoprolol tartrate (LOPRESSOR) 25 MG tablet  . traMADol (ULTRAM) 50 MG tablet   No current facility-administered medications for this encounter.     Jodell Cipro, PA-C [facility] Pre-Surgical Testing [phone]

## 2019-12-20 ENCOUNTER — Other Ambulatory Visit (HOSPITAL_COMMUNITY)
Admission: RE | Admit: 2019-12-20 | Discharge: 2019-12-20 | Disposition: A | Discharge status: Home / Self Care | Payer: Medicare HMO | Source: Ambulatory Visit | Attending: Surgery | Admitting: Surgery

## 2019-12-20 DIAGNOSIS — Z01812 Encounter for preprocedural laboratory examination: Secondary | ICD-10-CM | POA: Diagnosis not present

## 2019-12-20 DIAGNOSIS — Z20822 Contact with and (suspected) exposure to covid-19: Secondary | ICD-10-CM | POA: Diagnosis not present

## 2019-12-20 LAB — SARS CORONAVIRUS 2 (TAT 6-24 HRS): SARS Coronavirus 2: NEGATIVE

## 2019-12-23 ENCOUNTER — Encounter (HOSPITAL_COMMUNITY): Payer: Self-pay | Admitting: Surgery

## 2019-12-23 ENCOUNTER — Encounter (HOSPITAL_COMMUNITY)
Admission: RE | Disposition: A | Discharge status: Home / Self Care | Payer: Self-pay | Source: Home / Self Care | Attending: Surgery

## 2019-12-23 ENCOUNTER — Ambulatory Visit (HOSPITAL_COMMUNITY)
Admission: RE | Admit: 2019-12-23 | Discharge: 2019-12-23 | Disposition: A | Discharge status: Home / Self Care | Payer: Medicare HMO | Attending: Surgery | Admitting: Surgery

## 2019-12-23 DIAGNOSIS — Z79899 Other long term (current) drug therapy: Secondary | ICD-10-CM | POA: Insufficient documentation

## 2019-12-23 DIAGNOSIS — Z7982 Long term (current) use of aspirin: Secondary | ICD-10-CM | POA: Diagnosis not present

## 2019-12-23 DIAGNOSIS — Z7902 Long term (current) use of antithrombotics/antiplatelets: Secondary | ICD-10-CM | POA: Insufficient documentation

## 2019-12-23 DIAGNOSIS — Z951 Presence of aortocoronary bypass graft: Secondary | ICD-10-CM | POA: Diagnosis not present

## 2019-12-23 DIAGNOSIS — Z8673 Personal history of transient ischemic attack (TIA), and cerebral infarction without residual deficits: Secondary | ICD-10-CM | POA: Diagnosis not present

## 2019-12-23 DIAGNOSIS — K8020 Calculus of gallbladder without cholecystitis without obstruction: Secondary | ICD-10-CM | POA: Insufficient documentation

## 2019-12-23 LAB — TYPE AND SCREEN
ABO/RH(D): A POS
Antibody Screen: NEGATIVE

## 2019-12-23 LAB — GLUCOSE, CAPILLARY: Glucose-Capillary: 126 mg/dL — ABNORMAL HIGH (ref 70–99)

## 2019-12-23 SURGERY — LAPAROSCOPIC CHOLECYSTECTOMY
Anesthesia: General | Laterality: Left

## 2019-12-23 MED ORDER — MIDAZOLAM HCL 2 MG/2ML IJ SOLN
INTRAMUSCULAR | Status: AC
  Filled 2019-12-23: qty 2

## 2019-12-23 MED ORDER — CEFAZOLIN SODIUM-DEXTROSE 2-4 GM/100ML-% IV SOLN
2.0000 g | INTRAVENOUS | Status: DC
  Filled 2019-12-23: qty 100

## 2019-12-23 MED ORDER — CHLORHEXIDINE GLUCONATE 0.12 % MT SOLN: 15.0000 mL | Freq: Once | OROMUCOSAL | Status: AC

## 2019-12-23 MED ORDER — ACETAMINOPHEN 500 MG PO TABS
1000.0000 mg | ORAL_TABLET | ORAL | Status: AC
  Administered 2019-12-23: 1000 mg via ORAL
  Filled 2019-12-23: qty 2

## 2019-12-23 MED ORDER — ORAL CARE MOUTH RINSE
15.0000 mL | Freq: Once | OROMUCOSAL | Status: AC
  Administered 2019-12-23: 15 mL via OROMUCOSAL

## 2019-12-23 MED ORDER — FENTANYL CITRATE (PF) 100 MCG/2ML IJ SOLN
INTRAMUSCULAR | Status: AC
  Filled 2019-12-23: qty 2

## 2019-12-23 MED ORDER — ROCURONIUM BROMIDE 10 MG/ML (PF) SYRINGE
PREFILLED_SYRINGE | INTRAVENOUS | Status: AC
  Filled 2019-12-23: qty 10

## 2019-12-23 MED ORDER — LIDOCAINE HCL (PF) 2 % IJ SOLN
INTRAMUSCULAR | Status: AC
  Filled 2019-12-23: qty 5

## 2019-12-23 MED ORDER — PROPOFOL 10 MG/ML IV BOLUS
INTRAVENOUS | Status: AC
  Filled 2019-12-23: qty 20

## 2019-12-23 MED ORDER — CHLORHEXIDINE GLUCONATE CLOTH 2 % EX PADS: 6.0000 | MEDICATED_PAD | Freq: Once | CUTANEOUS | Status: DC

## 2019-12-23 MED ORDER — LACTATED RINGERS IV SOLN: INTRAVENOUS | Status: DC

## 2019-12-23 NOTE — Progress Notes (Signed)
Pt did not hold plavix , had dose yesterday . Dr. Cliffton Asters at bedside and has decided to cancel surgery.

## 2020-02-03 ENCOUNTER — Encounter (HOSPITAL_COMMUNITY): Payer: Self-pay | Admitting: Emergency Medicine

## 2020-02-03 ENCOUNTER — Emergency Department (HOSPITAL_COMMUNITY)
Admission: EM | Admit: 2020-02-03 | Discharge: 2020-02-04 | Disposition: A | Discharge status: Home / Self Care | Payer: Medicare Other | Attending: Emergency Medicine | Admitting: Emergency Medicine

## 2020-02-03 ENCOUNTER — Other Ambulatory Visit: Payer: Self-pay

## 2020-02-03 DIAGNOSIS — Z8673 Personal history of transient ischemic attack (TIA), and cerebral infarction without residual deficits: Secondary | ICD-10-CM | POA: Insufficient documentation

## 2020-02-03 DIAGNOSIS — E119 Type 2 diabetes mellitus without complications: Secondary | ICD-10-CM | POA: Insufficient documentation

## 2020-02-03 DIAGNOSIS — I2510 Atherosclerotic heart disease of native coronary artery without angina pectoris: Secondary | ICD-10-CM | POA: Diagnosis not present

## 2020-02-03 DIAGNOSIS — M5442 Lumbago with sciatica, left side: Secondary | ICD-10-CM | POA: Diagnosis not present

## 2020-02-03 DIAGNOSIS — Z79899 Other long term (current) drug therapy: Secondary | ICD-10-CM | POA: Diagnosis not present

## 2020-02-03 DIAGNOSIS — Z7982 Long term (current) use of aspirin: Secondary | ICD-10-CM | POA: Insufficient documentation

## 2020-02-03 DIAGNOSIS — I10 Essential (primary) hypertension: Secondary | ICD-10-CM | POA: Diagnosis not present

## 2020-02-03 DIAGNOSIS — Z951 Presence of aortocoronary bypass graft: Secondary | ICD-10-CM | POA: Diagnosis not present

## 2020-02-03 DIAGNOSIS — M545 Low back pain, unspecified: Secondary | ICD-10-CM | POA: Diagnosis present

## 2020-02-03 DIAGNOSIS — Z7902 Long term (current) use of antithrombotics/antiplatelets: Secondary | ICD-10-CM | POA: Insufficient documentation
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
## 2020-02-04 ENCOUNTER — Emergency Department (HOSPITAL_COMMUNITY): Payer: Medicare Other

## 2020-02-04 MED ORDER — ONDANSETRON HCL 4 MG/2ML IJ SOLN
4.0000 mg | Freq: Once | INTRAMUSCULAR | Status: AC
  Administered 2020-02-04: 4 mg via INTRAVENOUS
  Filled 2020-02-04: qty 2

## 2020-02-04 MED ORDER — MELOXICAM 15 MG PO TABS: 15.0000 mg | ORAL_TABLET | Freq: Every day | ORAL | 0 refills | Status: DC

## 2020-02-04 MED ORDER — DEXAMETHASONE SODIUM PHOSPHATE 10 MG/ML IJ SOLN
10.0000 mg | Freq: Once | INTRAMUSCULAR | Status: AC
  Administered 2020-02-04: 10 mg via INTRAVENOUS
  Filled 2020-02-04: qty 1

## 2020-02-04 MED ORDER — HYDROMORPHONE HCL 1 MG/ML IJ SOLN
1.0000 mg | Freq: Once | INTRAMUSCULAR | Status: AC
  Administered 2020-02-04: 1 mg via INTRAVENOUS
  Filled 2020-02-04: qty 1

## 2020-02-04 MED ORDER — KETOROLAC TROMETHAMINE 30 MG/ML IJ SOLN
15.0000 mg | Freq: Once | INTRAMUSCULAR | Status: AC
  Administered 2020-02-04: 15 mg via INTRAVENOUS
  Filled 2020-02-04: qty 1

## 2020-02-04 MED ORDER — HYDROCODONE-ACETAMINOPHEN 5-325 MG PO TABS: 1.0000 | ORAL_TABLET | ORAL | 0 refills | Status: DC | PRN

## 2020-02-04 MED ORDER — METHOCARBAMOL 500 MG PO TABS: 500.0000 mg | ORAL_TABLET | Freq: Three times a day (TID) | ORAL | 0 refills | Status: DC | PRN

## 2020-02-04 NOTE — ED Provider Notes (Signed)
[facility]-EMERGENCY DEPT Provider Note   CSN: [id number] Arrival date & time: 02/03/20  2327     History Chief Complaint  Patient presents with  . Back Pain    Tom Phelps is a 60 y.o. male.  Patient presents to the emergency department for evaluation of low back pain.  Symptoms present for 3 days.  Patient was seen by PCP and provided a prescription for tramadol but reports that the tramadol only made him sleepy but did not help the pain.  Today the pain has progressively worsened.  Any movement at the left hip or low back causes severe left-sided low back pain.  Pain is radiating to the upper left thigh, no numbness, tingling or weakness of lower extremities.  No change in bowel or bladder function.        Past Medical History:  Diagnosis Date  . Actinic keratoses   . Ankle dislocation    calcaneal tendons  . Anxiety   . Aortic atherosclerosis (HCC)   . Chronic pain of left knee 11/18/2017  . Coronary artery disease   . DDD (degenerative disc disease), cervical   . Diabetes mellitus without complication (HCC) 2019   no meds  . Displaced other extraarticular fracture of right calcaneus, initial encounter for closed fracture 07/13/2017  . DOE (dyspnea on exertion)   . Dyspnea   . Fatty liver    mild  . Fracture of distal end of right fibula   . Gallstones   . History of kidney stones   . Hypercholesteremia   . Hypercholesterolemia   . Hypertension   . Lightheadedness   . Neck pain    due to work   . Neuromuscular disorder (HCC)    carpal tunnel bi lat  . Prediabetes   . Pulmonary nodules   . Stress reaction   . Stroke (HCC)    2018 no residuals  . TIA (transient ischemic attack)    circulation     Patient Active Problem List   Diagnosis Date Noted  . History of TIA (transient ischemic attack) 08/16/2018  . S/P CABG x 2 07/29/2018  . Angina pectoris (HCC)   . Chronic pain of left knee 11/18/2017  . Displaced other extraarticular  fracture of right calcaneus, initial encounter for closed fracture 07/13/2017  . Vertigo 12/10/2016    Past Surgical History:  Procedure Laterality Date  . BACK SURGERY  2005   L4-L5 fusion  . CORONARY ARTERY BYPASS GRAFT N/A 07/29/2018   Procedure: CORONARY ARTERY BYPASS GRAFTING (CABG) x 2;  Surgeon: Corliss Skains, MD;  Location: [facility] OR;  Service: Open Heart Surgery;  Laterality: N/A;  . ENDOVEIN HARVEST OF GREATER SAPHENOUS VEIN Left 07/29/2018   Procedure: Endovein Harvest Of Greater Saphenous Vein Left Thigh; Exploration only of right leg greater saphenous vein;  Surgeon: Corliss Skains, MD;  Location: [facility] OR;  Service: Open Heart Surgery;  Laterality: Left;  . INTRAVASCULAR PRESSURE WIRE/FFR STUDY N/A 07/20/2018   Procedure: INTRAVASCULAR PRESSURE WIRE/FFR STUDY;  Surgeon: Corky Crafts, MD;  Location: [facility] INVASIVE CV LAB;  Service: Cardiovascular;  Laterality: N/A;  . LEFT HEART CATH AND CORONARY ANGIOGRAPHY N/A 07/20/2018   Procedure: LEFT HEART CATH AND CORONARY ANGIOGRAPHY;  Surgeon: Corky Crafts, MD;  Location: [facility] INVASIVE CV LAB;  Service: Cardiovascular;  Laterality: N/A;  . TEE WITHOUT CARDIOVERSION N/A 07/29/2018   Procedure: TRANSESOPHAGEAL ECHOCARDIOGRAM (TEE);  Surgeon: Corliss Skains, MD;  Location: [facility] OR;  Service: Open Heart Surgery;  Laterality: N/A;       Family History  Problem Relation Age of Onset  . CAD Mother   . CAD Father     Social History   Tobacco Use  . Smoking status: Never Smoker  . Smokeless tobacco: Never Used  Vaping Use  . Vaping Use: Never used  Substance Use Topics  . Alcohol use: Never  . Drug use: Never    Home Medications Prior to Admission medications   Medication Sig Start Date End Date Taking? Authorizing Provider  HYDROcodone-acetaminophen (NORCO/VICODIN) 5-325 MG tablet Take 1 tablet by mouth every 4 (four) hours as needed for moderate pain. 02/04/20  Yes Pollina, Canary Brim, MD  meloxicam  (MOBIC) 15 MG tablet Take 1 tablet (15 mg total) by mouth daily. 02/04/20  Yes Pollina, Canary Brim, MD  methocarbamol (ROBAXIN) 500 MG tablet Take 1 tablet (500 mg total) by mouth every 8 (eight) hours as needed for muscle spasms. 02/04/20  Yes Pollina, Canary Brim, MD  aspirin EC 81 MG tablet Take 81 mg by mouth daily.    [provider]  atorvastatin (LIPITOR) 80 MG tablet Take 1 tablet by mouth once daily Patient taking differently: Take 80 mg by mouth daily. 08/03/19   Corky Crafts, MD  clopidogrel (PLAVIX) 75 MG tablet Take 75 mg by mouth daily.    [provider]  diclofenac Sodium (VOLTAREN) 1 % GEL Apply 4 g topically 4 (four) times daily as needed. Patient not taking: Reported on 12/07/2019 03/07/19   Hilts, Casimiro Needle, MD  ibuprofen (ADVIL) 800 MG tablet Take 800 mg by mouth 3 (three) times daily as needed for mild pain or moderate pain.  10/08/18   [provider]  metoprolol tartrate (LOPRESSOR) 25 MG tablet Take 1 tablet by mouth twice daily Patient taking differently: Take 25 mg by mouth 2 (two) times daily. 11/14/19   Corky Crafts, MD  traMADol (ULTRAM) 50 MG tablet Take 1 tablet (50 mg total) by mouth every 6 (six) hours as needed for moderate pain. Patient not taking: Reported on 12/07/2019 08/02/18   Gershon Crane E, PA-C    Allergies    Metformin and related, Metformin hcl, Oxycodone, and Oxycodone hcl  Review of Systems   Review of Systems  Musculoskeletal: Positive for back pain.  Neurological: Negative.   All other systems reviewed and are negative.   Physical Exam Updated Vital Signs BP 130/80   Pulse 63   Temp 98 F (36.7 C) (Oral)   Resp 17   Ht 5\' 10"  (1.778 m)   Wt 93 kg   SpO2 97%   BMI 29.41 kg/m   Physical Exam Vitals and nursing note reviewed.  Constitutional:      General: He is not in acute distress.    Appearance: Normal appearance. He is well-developed and well-nourished.  HENT:     Head: Normocephalic and  atraumatic.     Right Ear: Hearing normal.     Left Ear: Hearing normal.     Nose: Nose normal.     Mouth/Throat:     Mouth: Oropharynx is clear and moist and mucous membranes are normal.  Eyes:     Extraocular Movements: EOM normal.     Conjunctiva/sclera: Conjunctivae normal.     Pupils: Pupils are equal, round, and reactive to light.  Cardiovascular:     Rate and Rhythm: Regular rhythm.     Heart sounds: S1 normal and S2 normal. No murmur heard. No friction rub. No gallop.  Pulmonary:     Effort: Pulmonary effort is normal. No respiratory distress.     Breath sounds: Normal breath sounds.  Chest:     Chest wall: No tenderness.  Abdominal:     General: Bowel sounds are normal.     Palpations: Abdomen is soft. There is no hepatosplenomegaly.     Tenderness: There is no abdominal tenderness. There is no guarding or rebound. Negative signs include Murphy's sign and McBurney's sign.     Hernia: No hernia is present.  Musculoskeletal:        General: Normal range of motion.     Cervical back: Normal range of motion and neck supple.     Lumbar back: Spasms and tenderness present. Negative right straight leg raise test and negative left straight leg raise test.       Back:  Skin:    General: Skin is warm, dry and intact.     Findings: No rash.     Nails: There is no cyanosis.  Neurological:     Mental Status: He is alert and oriented to person, place, and time.     GCS: GCS eye subscore is 4. GCS verbal subscore is 5. GCS motor subscore is 6.     Cranial Nerves: No cranial nerve deficit.     Sensory: No sensory deficit.     Coordination: Coordination normal.     Deep Tendon Reflexes: Strength normal.  Psychiatric:        Mood and Affect: Mood and affect normal.        Speech: Speech normal.        Behavior: Behavior normal.        Thought Content: Thought content normal.     ED Results / Procedures / Treatments   Labs (all labs ordered are listed, but only abnormal  results are displayed) Labs Reviewed - No data to display  EKG None  Radiology CT Lumbar Spine Wo Contrast  Result Date: 02/04/2020 CLINICAL DATA:  Low back pain EXAM: CT LUMBAR SPINE WITHOUT CONTRAST TECHNIQUE: Multidetector CT imaging of the lumbar spine was performed without intravenous contrast administration. Multiplanar CT image reconstructions were also generated. COMPARISON:  None. FINDINGS: Segmentation: 5 lumbar type vertebrae. Alignment: Normal. Vertebrae: No fracture. Posterior instrumented fusion at L5-S1. No acute fracture. Paraspinal and other soft tissues: Calcific aortic atherosclerosis. Disc levels: No spinal canal stenosis. IMPRESSION: 1. No acute fracture or static subluxation of the lumbar spine. 2. Posterior instrumented fusion at L5-S1 without evidence of hardware complication. Aortic Atherosclerosis (ICD10-I70.0). Electronically Signed   By: Deatra Robinson M.D.   On: 02/04/2020 02:41    Procedures Procedures   Medications Ordered in ED Medications  dexamethasone (DECADRON) injection 10 mg (has no administration in time range)  ketorolac (TORADOL) 30 MG/ML injection 15 mg (15 mg Intravenous Given 02/04/20 0138)  HYDROmorphone (DILAUDID) injection 1 mg (1 mg Intravenous Given 02/04/20 0138)  ondansetron (ZOFRAN) injection 4 mg (4 mg Intravenous Given 02/04/20 0138)    ED Course  I have reviewed the triage vital signs and the nursing notes.  Pertinent labs & imaging results that were available during my care of the patient were reviewed by me and considered in my medical decision making (see chart for details).    MDM Rules/Calculators/A&P                          Patient presents to the emergency department for evaluation of back pain.  Pain  is located in the left lower back and posterior hip/SI area.  No injury but he has had previous lumbar surgery.  Examination reveals negative straight leg raise.  He feels a little bit of pain radiating into the upper thigh but  no significant radiculopathy.  He has normal strength in both lower extremities, no foot drop, no saddle anesthesia.  A CT scan was performed and previous surgical site appears to have healed well without complications, no other significant abnormality noted.  He seems to be having severe spasms with movement.  Treated with analgesia here in the ER and has improved somewhat.  Discussed with patient rest, warm compresses, analgesia and PCP follow-up.  Final Clinical Impression(s) / ED Diagnoses Final diagnoses:  Acute left-sided low back pain with left-sided sciatica    Rx / DC Orders ED Discharge Orders         Ordered    meloxicam (MOBIC) 15 MG tablet  Daily        02/04/20 0253    methocarbamol (ROBAXIN) 500 MG tablet  Every 8 hours PRN        02/04/20 0253    HYDROcodone-acetaminophen (NORCO/VICODIN) 5-325 MG tablet  Every 4 hours PRN        02/04/20 0253           Gilda Crease, MD 02/04/20 [id number]

## 2020-02-09 DIAGNOSIS — M545 Low back pain, unspecified: Secondary | ICD-10-CM | POA: Diagnosis not present

## 2020-02-11 ENCOUNTER — Other Ambulatory Visit: Payer: Self-pay | Admitting: Interventional Cardiology

## 2020-02-13 ENCOUNTER — Other Ambulatory Visit: Payer: Self-pay

## 2020-02-13 MED ORDER — METOPROLOL TARTRATE 25 MG PO TABS: 25.0000 mg | ORAL_TABLET | Freq: Two times a day (BID) | ORAL | 2 refills | Status: DC

## 2020-02-20 NOTE — Progress Notes (Signed)
Pt. Needs orders for upcoming surgery.PAT and labs: 02/21/20.Thanks.

## 2020-02-20 NOTE — Patient Instructions (Signed)
DUE TO COVID-19 ONLY ONE VISITOR IS ALLOWED TO COME WITH YOU AND STAY IN THE WAITING ROOM ONLY DURING PRE OP AND PROCEDURE DAY OF SURGERY. THE 1 VISITOR  MAY VISIT WITH YOU AFTER SURGERY IN YOUR PRIVATE ROOM DURING VISITING HOURS ONLY!  YOU NEED TO HAVE A COVID 19 TEST ON: 02/28/20 @10 :30 AM , THIS TEST MUST BE DONE BEFORE SURGERY,  COVID TESTING SITE [address] [place] , IT IS ON THE RIGHT GOING OUT [address] APPROXIMATELY  2 MINUTES PAST [employer] ON THE RIGHT. ONCE YOUR COVID TEST IS COMPLETED,  PLEASE BEGIN THE QUARANTINE INSTRUCTIONS AS OUTLINED IN YOUR HANDOUT.                Tom Phelps    Your procedure is scheduled on: 03/02/20   Report to [facility] Main  Entrance   Report to admitting at: 8:00 AM     Call this number if you have problems the morning of surgery [phone]    Remember: Do not eat food or drink liquids :After Midnight.   BRUSH YOUR TEETH MORNING OF SURGERY AND RINSE YOUR MOUTH OUT, NO CHEWING GUM CANDY OR MINTS.    Take these medicines the morning of surgery with A SIP OF WATER: metoprolol,prednisone.                               You may not have any metal on your body including hair pins and              piercings  Do not wear jewelry,lotions, powders or perfumes, deodorant             Men may shave face and neck.   Do not bring valuables to the hospital. [facility] IS NOT             RESPONSIBLE   FOR VALUABLES.  Contacts, dentures or bridgework may not be worn into surgery.  Leave suitcase in the car. After surgery it may be brought to your room.     Patients discharged the day of surgery will not be allowed to drive home. IF YOU ARE HAVING SURGERY AND GOING HOME THE SAME DAY, YOU MUST HAVE AN ADULT TO DRIVE YOU HOME AND BE WITH YOU FOR 24 HOURS. YOU MAY GO HOME BY TAXI OR UBER OR ORTHERWISE, BUT AN ADULT MUST ACCOMPANY YOU HOME AND STAY WITH YOU FOR 24 HOURS.  Name and phone number of your  driver:  Special Instructions: N/A              Please read over the following fact sheets you were given: _____________________________________________________________________          [facility] - Preparing for Surgery Before surgery, you can play an important role.  Because skin is not sterile, your skin needs to be as free of germs as possible.  You can reduce the number of germs on your skin by washing with CHG (chlorahexidine gluconate) soap before surgery.  CHG is an antiseptic cleaner which kills germs and bonds with the skin to continue killing germs even after washing. Please DO NOT use if you have an allergy to CHG or antibacterial soaps.  If your skin becomes reddened/irritated stop using the CHG and inform your nurse when you arrive at Short Stay. Do not shave (including legs and underarms) for at least 48 hours prior to the first CHG shower.  You may shave your face/neck. Please follow these instructions carefully:  1.  Shower with CHG Soap the night before surgery and the  morning of Surgery.  2.  If you choose to wash your hair, wash your hair first as usual with your  normal  shampoo.  3.  After you shampoo, rinse your hair and body thoroughly to remove the  shampoo.                           4.  Use CHG as you would any other liquid soap.  You can apply chg directly  to the skin and wash                       Gently with a scrungie or clean washcloth.  5.  Apply the CHG Soap to your body ONLY FROM THE NECK DOWN.   Do not use on face/ open                           Wound or open sores. Avoid contact with eyes, ears mouth and genitals (private parts).                       Wash face,  Genitals (private parts) with your normal soap.             6.  Wash thoroughly, paying special attention to the area where your surgery  will be performed.  7.  Thoroughly rinse your body with warm water from the neck down.  8.  DO NOT shower/wash with your normal soap after using and rinsing off   the CHG Soap.                9.  Pat yourself dry with a clean towel.            10.  Wear clean pajamas.            11.  Place clean sheets on your bed the night of your first shower and do not  sleep with pets. Day of Surgery : Do not apply any lotions/deodorants the morning of surgery.  Please wear clean clothes to the hospital/surgery center.  FAILURE TO FOLLOW THESE INSTRUCTIONS MAY RESULT IN THE CANCELLATION OF YOUR SURGERY PATIENT SIGNATURE_________________________________  NURSE SIGNATURE__________________________________  ________________________________________________________________________

## 2020-02-21 ENCOUNTER — Encounter (HOSPITAL_COMMUNITY)
Admission: RE | Admit: 2020-02-21 | Discharge: 2020-02-21 | Disposition: A | Discharge status: Home / Self Care | Payer: Medicare HMO | Source: Ambulatory Visit | Attending: Surgery | Admitting: Surgery

## 2020-02-21 ENCOUNTER — Telehealth: Payer: Self-pay | Admitting: *Deleted

## 2020-02-21 ENCOUNTER — Encounter (HOSPITAL_COMMUNITY): Payer: Self-pay

## 2020-02-21 ENCOUNTER — Other Ambulatory Visit: Payer: Self-pay

## 2020-02-21 ENCOUNTER — Ambulatory Visit: Payer: Self-pay | Admitting: Surgery

## 2020-02-21 DIAGNOSIS — Z01812 Encounter for preprocedural laboratory examination: Secondary | ICD-10-CM | POA: Diagnosis not present

## 2020-02-21 LAB — CBC WITH DIFFERENTIAL/PLATELET
Abs Immature Granulocytes: 0.1 10*3/uL — ABNORMAL HIGH (ref 0.00–0.07)
Basophils Absolute: 0 10*3/uL (ref 0.0–0.1)
Basophils Relative: 0 %
Eosinophils Absolute: 0 10*3/uL (ref 0.0–0.5)
Eosinophils Relative: 0 %
HCT: 44.8 % (ref 39.0–52.0)
Hemoglobin: 15.2 g/dL (ref 13.0–17.0)
Immature Granulocytes: 1 %
Lymphocytes Relative: 15 %
Lymphs Abs: 2 10*3/uL (ref 0.7–4.0)
MCH: 30.5 pg (ref 26.0–34.0)
MCHC: 33.9 g/dL (ref 30.0–36.0)
MCV: 89.8 fL (ref 80.0–100.0)
Monocytes Absolute: 0.8 10*3/uL (ref 0.1–1.0)
Monocytes Relative: 6 %
Neutro Abs: 10.1 10*3/uL — ABNORMAL HIGH (ref 1.7–7.7)
Neutrophils Relative %: 78 %
Platelets: 304 10*3/uL (ref 150–400)
RBC: 4.99 MIL/uL (ref 4.22–5.81)
RDW: 13.7 % (ref 11.5–15.5)
WBC: 13.1 10*3/uL — ABNORMAL HIGH (ref 4.0–10.5)
nRBC: 0 % (ref 0.0–0.2)

## 2020-02-21 LAB — COMPREHENSIVE METABOLIC PANEL
ALT: 15 U/L (ref 0–44)
AST: 19 U/L (ref 15–41)
Albumin: 4.7 g/dL (ref 3.5–5.0)
Alkaline Phosphatase: 94 U/L (ref 38–126)
Anion gap: 10 (ref 5–15)
BUN: 25 mg/dL — ABNORMAL HIGH (ref 8–23)
CO2: 24 mmol/L (ref 22–32)
Calcium: 9.7 mg/dL (ref 8.9–10.3)
Chloride: 102 mmol/L (ref 98–111)
Creatinine, Ser: 1.29 mg/dL — ABNORMAL HIGH (ref 0.61–1.24)
GFR, Estimated: >60 mL/min (ref >60)
Glucose, Bld: 260 mg/dL — ABNORMAL HIGH (ref 70–99)
Potassium: 4.8 mmol/L (ref 3.5–5.1)
Sodium: 136 mmol/L (ref 135–145)
Total Bilirubin: 0.9 mg/dL (ref 0.3–1.2)
Total Protein: 8.1 g/dL (ref 6.5–8.1)

## 2020-02-21 LAB — PROTIME-INR
INR: 1 (ref 0.8–1.2)
Prothrombin Time: 12.9 seconds (ref 11.4–15.2)

## 2020-02-21 LAB — GLUCOSE, CAPILLARY: Glucose-Capillary: 244 mg/dL — ABNORMAL HIGH (ref 70–99)

## 2020-02-21 NOTE — Progress Notes (Signed)
COVID Vaccine Completed: Yes Date COVID Vaccine completed: 10/25/19. [place] COVID vaccine manufacturer: Pfizer      PCP - Dr. Mila Palmer  Cardiologist - Dr. Lance Muss. Clearance: 10/05/19 : epic  Chest x-ray -  EKG - 10/01/19 Stress Test -  ECHO - 07/29/18 Cardiac Cath - 07/20/18 Pacemaker/ICD device last checked:  Sleep Study -  CPAP -   Fasting Blood Sugar - N/A Checks Blood Sugar __0___ times a day  Blood Thinner Instructions: As per cardiologist note on 10/05/19 is OK to hold Plavix 5-7 days. RN asked pt. To call cardiologist and surgeon to verify what day he should put Plavix on hold,also to ask about Aspirin 81 mg. Aspirin Instructions: Last Dose:  Anesthesia review: Hx: CAD,Stroke,HTN,CABG,DIA  Patient denies shortness of breath, fever, cough and chest pain at PAT appointment   Patient verbalized understanding of instructions that were given to them at the PAT appointment. Patient was also instructed that they will need to review over the PAT instructions again at home before surgery.

## 2020-02-21 NOTE — Telephone Encounter (Signed)
   [facility] Pre-operative Risk Assessment    HEARTCARE STAFF: - Please ensure there is not already an duplicate clearance open for this procedure. - Under Visit Info/Reason for Call, type in Other and utilize the format Clearance MM/DD/YY or Clearance TBD. Do not use dashes or single digits. - If request is for dental extraction, please clarify the # of teeth to be extracted.  Request for surgical clearance:  1. What type of surgery is being performed? LAP CHOLE   2. When is this surgery scheduled? 03/02/20   3. What type of clearance is required (medical clearance vs. Pharmacy clearance to hold med vs. Both)? MEDICAL  4. Are there any medications that need to be held prior to surgery and how long? PLAVIX AND ASA   5. Practice name and name of physician performing surgery? [facility]; DR. Harrell Gave WHITE   6. What is the office phone number? [phone]   7.   What is the office fax number? [phone] ATTN: Mammie Lorenzo, LPN  8.   Anesthesia type (None, local, MAC, general) ? GENERAL   Julaine Hua 02/21/2020, 6:03 PM  _________________________________________________________________   (provider comments below)

## 2020-02-22 LAB — HEMOGLOBIN A1C
Hgb A1c MFr Bld: 8.5 % — ABNORMAL HIGH (ref 4.8–5.6)
Mean Plasma Glucose: 197 mg/dL

## 2020-02-22 NOTE — Telephone Encounter (Signed)
Left message for pt to call back.  Of note, request is to hold Plavix which was given due to hx of prior TIA.  I will also f/u with Dr. Eldridge Dace to see if we want to make recommendations for this med or have recommendations come from primary care. Tereso Newcomer, PA-C    02/22/2020 10:38 AM

## 2020-02-22 NOTE — Telephone Encounter (Signed)
Would check with PCP about plavix.  OK to hold for surgery for 5 days from a cardiac standpoint, but I am not sure if they would do something different from a neuro standpoint.

## 2020-02-22 NOTE — Telephone Encounter (Signed)
   Primary Cardiologist: Lance Muss, MD  Chart reviewed as part of pre-operative protocol coverage.   Hx:  CAD s/p CABG in 07/2018, prior TIA, DM2, HTN, HLD.  Echo 06/21/2018: EF 50-55, no valve disease  Last seen by Jacolyn Reedy, PA-C:  10/11/19 OV was for surgical clearance for lap chole.  He was cleared to proceed at that time.   RCRI:  Perioperative Risk of Major Cardiac Event is (%): 11 (high risk) DASI:  5.07 METs  (functional status is good )  Patient was contacted 02/22/2020 in reference to pre-operative risk assessment for pending surgery as outlined below.    Since last seen, Tom Phelps has done well without exertional chest pain, shortness of breath or syncope.  Recommendations:  Therefore, based on ACC/AHA guidelines, the patient would be at acceptable risk for the planned procedure without further cardiovascular testing.   There is no cardiac contraindication to holding Plavix for the patient's surgery.  However, the patient's Clopidogrel (Plavix) was prescribed due to a hx of TIA.  Cardiology does not manage his Plavix.  We recommend asking the patient's primary care provider for guidance on whether or not it is ok to hold Plavix for the procedure.   We recommend continuing aspirin without interruption (especially if Plavix is held) if the bleeding risk is acceptable.  Please call with questions. Tereso Newcomer, PA-C 02/22/2020 1:27 PM

## 2020-02-22 NOTE — Telephone Encounter (Signed)
Notes faxed to surgeon. This phone note will be removed from the preop pool. Tereso Newcomer, PA-C  02/22/2020 1:32 PM

## 2020-02-22 NOTE — Progress Notes (Signed)
Lab results: A1-C: 8.5; Glucose bld: 260

## 2020-02-23 NOTE — Progress Notes (Signed)
Anesthesia Chart Review   Case: [id number] Date/Time: 03/02/20 0945   Procedure: LAPAROSCOPIC CHOLECYSTECTOMY (Left )   Anesthesia type: General   Pre-op diagnosis: SYMPTOMATIC CHOLELITHIASIS   Location: [facility] ROOM 01 / [facility]   Surgeons: Andria Meuse, MD      DISCUSSION:60 y.o. never smoker with h/o HTN, DM II, CAD, Stroke 2018, symptomatic cholelithiasis scheduled for above procedure 03/02/2020 with Dr. Marin Olp.   Surgery previously cancelled because pt did not hold Plavix.    Per cardiology preoperative evaluation 02/22/2020, "Last seen by Jacolyn Reedy, PA-C:  10/11/19 OV was for surgical clearance for lap chole.  He was cleared to proceed at that time.  RCRI:  Perioperative Risk of Major Cardiac Event is (%): 11 (high risk) DASI:  5.07 METs  (functional status is good ) Patient was contacted 02/22/2020 in reference to pre-operative risk assessment for pending surgery as outlined below.   Since last seen, Shiraz Bastyr has done well without exertional chest pain, shortness of breath or syncope. Recommendations:  Therefore, based on ACC/AHA guidelines, the patient would be at acceptable risk for the planned procedure without further cardiovascular testing.   There is no cardiac contraindication to holding Plavix for the patient's surgery.  However, the patient's Clopidogrel (Plavix) was prescribed due to a hx of TIA.  Cardiology does not manage his Plavix.  We recommend asking the patient's primary care provider for guidance on whether or not it is ok to hold Plavix for the procedure.   We recommend continuing aspirin without interruption (especially if Plavix is held) if the bleeding risk is acceptable."  VM left with PCP to discuss Plavix and elevated A1C.   Addendum 02/28/20:  Note received from PCP which states he may hold Plavix 5 days prior to procedure.   Evaluate CBG DOS.  VS: BP (!) 146/84   Pulse 89   Temp 36.8 C (Oral)   Ht 5\' 10"  (1.778 m)   Wt 87.1 kg    SpO2 99%   BMI 27.55 kg/m   PROVIDERS: , MD is PCP   Mila Palmer, MD is Cardiologist  LABS: Labs reviewed: Acceptable for surgery. (all labs ordered are listed, but only abnormal results are displayed)  Labs Reviewed  CBC WITH DIFFERENTIAL/PLATELET - Abnormal; Notable for the following components:      Result Value   WBC 13.1 (*)    Neutro Abs 10.1 (*)    Abs Immature Granulocytes 0.10 (*)    All other components within normal limits  COMPREHENSIVE METABOLIC PANEL - Abnormal; Notable for the following components:   Glucose, Bld 260 (*)    BUN 25 (*)    Creatinine, Ser 1.29 (*)    All other components within normal limits  GLUCOSE, CAPILLARY - Abnormal; Notable for the following components:   Glucose-Capillary 244 (*)    All other components within normal limits  HEMOGLOBIN A1C - Abnormal; Notable for the following components:   Hgb A1c MFr Bld 8.5 (*)    All other components within normal limits  PROTIME-INR     IMAGES:   EKG: 10/01/2019 Rate 71 bpm  Sinus rhythm  Nonspecific T wave abnormality Baseline wander  CV: Echo 06/21/2018 IMPRESSIONS    1. The left ventricle has low normal systolic function, with an ejection  fraction of 50-55%. The cavity size was normal. There is mildly increased  left ventricular wall thickness. Left ventricular diastolic parameters  were normal.  2. The right ventricle has normal systolic function. The  cavity was  normal. There is no increase in right ventricular wall thickness.  3. Mild thickening of the mitral valve leaflet.  4. The aortic valve is tricuspid. Mild thickening of the aortic valve.  Mild calcification of the aortic valve.  Past Medical History:  Diagnosis Date  . Actinic keratoses   . Ankle dislocation    calcaneal tendons  . Anxiety   . Aortic atherosclerosis (HCC)   . Chronic pain of left knee 11/18/2017  . Coronary artery disease   . DDD (degenerative disc disease), cervical   .  Diabetes mellitus without complication (HCC) 2019   no meds  . Displaced other extraarticular fracture of right calcaneus, initial encounter for closed fracture 07/13/2017  . DOE (dyspnea on exertion)   . Dyspnea   . Fatty liver    mild  . Fracture of distal end of right fibula   . Gallstones   . History of kidney stones   . Hypercholesteremia   . Hypercholesterolemia   . Hypertension   . Lightheadedness   . Neck pain    due to work   . Neuromuscular disorder (HCC)    carpal tunnel bi lat  . Prediabetes   . Pulmonary nodules   . Stress reaction   . Stroke (HCC)    2018 no residuals  . TIA (transient ischemic attack)    circulation     Past Surgical History:  Procedure Laterality Date  . BACK SURGERY  2005   L4-L5 fusion  . CORONARY ARTERY BYPASS GRAFT N/A 07/29/2018   Procedure: CORONARY ARTERY BYPASS GRAFTING (CABG) x 2;  Surgeon: Corliss Skains, MD;  Location: [facility];  Service: Open Heart Surgery;  Laterality: N/A;  . ENDOVEIN HARVEST OF GREATER SAPHENOUS VEIN Left 07/29/2018   Procedure: Endovein Harvest Of Greater Saphenous Vein Left Thigh; Exploration only of right leg greater saphenous vein;  Surgeon: Corliss Skains, MD;  Location: [facility] OR;  Service: Open Heart Surgery;  Laterality: Left;  . INTRAVASCULAR PRESSURE WIRE/FFR STUDY N/A 07/20/2018   Procedure: INTRAVASCULAR PRESSURE WIRE/FFR STUDY;  Surgeon: Corky Crafts, MD;  [facility] INVASIVE CV LAB;  Service: Cardiovascular;  Laterality: N/A;  . LEFT HEART CATH AND CORONARY ANGIOGRAPHY N/A 07/20/2018   Procedure: LEFT HEART CATH AND CORONARY ANGIOGRAPHY;  Surgeon: Corky Crafts, MD;  Location: [facility] INVASIVE CV LAB;  Service: Cardiovascular;  Laterality: N/A;  . TEE WITHOUT CARDIOVERSION N/A 07/29/2018   Procedure: TRANSESOPHAGEAL ECHOCARDIOGRAM (TEE);  Surgeon: Corliss Skains, MD;  Location: [facility] OR;  Service: Open Heart Surgery;  Laterality: N/A;    MEDICATIONS: . aspirin EC 81 MG tablet  .  atorvastatin (LIPITOR) 80 MG tablet  . clopidogrel (PLAVIX) 75 MG tablet  . diclofenac Sodium (VOLTAREN) 1 % GEL  . HYDROcodone-acetaminophen (NORCO/VICODIN) 5-325 MG tablet  . ibuprofen (ADVIL) 800 MG tablet  . meloxicam (MOBIC) 15 MG tablet  . methocarbamol (ROBAXIN) 500 MG tablet  . metoprolol tartrate (LOPRESSOR) 25 MG tablet  . predniSONE (DELTASONE) 10 MG tablet  . traMADol (ULTRAM) 50 MG tablet   No current facility-administered medications for this encounter.    Jodell Cipro, PA-C [facility] [phone]

## 2020-02-28 ENCOUNTER — Other Ambulatory Visit (HOSPITAL_COMMUNITY)
Admission: RE | Admit: 2020-02-28 | Discharge: 2020-02-28 | Disposition: A | Discharge status: Home / Self Care | Payer: Medicare HMO | Source: Ambulatory Visit | Attending: Surgery | Admitting: Surgery

## 2020-02-28 DIAGNOSIS — Z01812 Encounter for preprocedural laboratory examination: Secondary | ICD-10-CM | POA: Diagnosis not present

## 2020-02-28 DIAGNOSIS — Z794 Long term (current) use of insulin: Secondary | ICD-10-CM | POA: Diagnosis not present

## 2020-02-28 DIAGNOSIS — E1169 Type 2 diabetes mellitus with other specified complication: Secondary | ICD-10-CM | POA: Diagnosis not present

## 2020-02-28 DIAGNOSIS — E7800 Pure hypercholesterolemia, unspecified: Secondary | ICD-10-CM | POA: Diagnosis not present

## 2020-02-28 DIAGNOSIS — G459 Transient cerebral ischemic attack, unspecified: Secondary | ICD-10-CM | POA: Diagnosis not present

## 2020-02-28 DIAGNOSIS — Z20822 Contact with and (suspected) exposure to covid-19: Secondary | ICD-10-CM | POA: Insufficient documentation

## 2020-02-28 LAB — SARS CORONAVIRUS 2 (TAT 6-24 HRS): SARS Coronavirus 2: NEGATIVE

## 2020-02-28 NOTE — Anesthesia Preprocedure Evaluation (Addendum)
Anesthesia Evaluation  Patient identified by MRN, date of birth, ID band Patient awake    Reviewed: Allergy & Precautions, NPO status , Patient's Chart, lab work & pertinent test results  History of Anesthesia Complications Negative for: history of anesthetic complications  Airway Mallampati: II  TM Distance: >3 FB Neck ROM: Full    Dental  (+) Dental Advisory Given, Upper Dentures   Pulmonary    Pulmonary exam normal        Cardiovascular hypertension, + CAD and + CABG  Normal cardiovascular exam  Per cardiology preoperative evaluation 02/22/2020, "Last seen by Jacolyn Reedy, PA-C: 10/11/19 OV was for surgical clearance for lap chole. He was cleared to proceed at that time.  RCRI: Perioperative Risk of Major Cardiac Event is (%): 11 (highrisk) DASI:5.07 METs(functional status is good) Patient was contacted 02/22/2020 in reference to pre-operative risk assessment for pending surgery as outlined below.  Since last seen, Jenesis Hargreaves has donewell without exertional chest pain, shortness of breath or syncope. Recommendations:  Therefore, based on ACC/AHA guidelines, the patient would be at acceptable risk for the planned procedure without further cardiovascular testing.  There is no cardiac contraindication to holding Plavix for the patient's surgery. However, the patient's Clopidogrel (Plavix) was prescribed due to a hx of TIA. Cardiology does not manage his Plavix. We recommend asking the patient's primary care provider for guidance on whether or not it is ok to hold Plavix for the procedure.   We recommend continuing aspirin without interruption (especially if Plavix is held) if the bleeding risk is acceptable."    Neuro/Psych PSYCHIATRIC DISORDERS Anxiety CVA, No Residual Symptoms    GI/Hepatic   Endo/Other  diabetes  Renal/GU Renal InsufficiencyRenal disease     Musculoskeletal   Abdominal   Peds   Hematology   Anesthesia Other Findings   Reproductive/Obstetrics                                                           Anesthesia Evaluation  Patient identified by MRN, date of birth, ID band Patient awake    Reviewed: Allergy & Precautions, NPO status , Patient's Chart, lab work & pertinent test results  Airway Mallampati: II  TM Distance: >3 FB Neck ROM: Full    Dental no notable dental hx.    Pulmonary neg pulmonary ROS,    Pulmonary exam normal breath sounds clear to auscultation       Cardiovascular hypertension, + CAD and + DOE  Normal cardiovascular exam Rhythm:Regular Rate:Normal  . The left ventricle has low normal systolic function, with an ejection fraction of 50-55%. The cavity size was normal. There is mildly increased left ventricular wall thickness. Left ventricular diastolic parameters were normal.  2. The right ventricle has normal systolic function. The cavity was normal. There is no increase in right ventricular wall thickness.  3. Mild thickening of the mitral valve leaflet.  4. The aortic valve is tricuspid. Mild thickening of the aortic valve. Mild calcification of the aortic valve.  FINDINGS  Left Ventricle: The left ventricle has low normal systolic function, with an ejection fraction of 50-55%. The cavity size was normal. There is mildly increased left ventricular wall thickness. Left ventricular diastolic parameters were normal.  Right Ventricle: The right ventricle has normal systolic function. The cavity was normal. There  is no increase in right ventricular wall thickness.  Left Atrium: Left atrial size was normal in size.  Right Atrium: Right atrial size was normal in size. Right atrial pressure is estimated at 10 mmHg.  Interatrial Septum: No atrial level shunt detected by color flow Doppler.  Pericardium: There is no evidence of pericardial effusion.  Mitral Valve: The mitral valve is normal in  structure. Mild thickening of the mitral valve leaflet. Mitral valve regurgitation is trivial by color flow Doppler.    Neuro/Psych TIAnegative psych ROS   GI/Hepatic negative GI ROS, Neg liver ROS,   Endo/Other  negative endocrine ROSdiabetes  Renal/GU negative Renal ROS  negative genitourinary   Musculoskeletal negative musculoskeletal ROS (+)   Abdominal   Peds negative pediatric ROS (+)  Hematology negative hematology ROS (+)   Anesthesia Other Findings   Reproductive/Obstetrics negative OB ROS                            Anesthesia Physical Anesthesia Plan  ASA: III  Anesthesia Plan: General   Post-op Pain Management:    Induction: Intravenous  PONV Risk Score and Plan: 0  Airway Management Planned: Oral ETT  Additional Equipment: Arterial line, CVP, PA Cath, TEE and Ultrasound Guidance Line Placement  Intra-op Plan:   Post-operative Plan: Extubation in OR  Informed Consent: I have reviewed the patients History and Physical, chart, labs and discussed the procedure including the risks, benefits and alternatives for the proposed anesthesia with the patient or authorized representative who has indicated his/her understanding and acceptance.     Dental advisory given  Plan Discussed with: CRNA and Surgeon  Anesthesia Plan Comments: (PAT note written 07/28/2018 by Shonna Chock, PA-C. )       Anesthesia Quick Evaluation  Anesthesia Physical Anesthesia Plan  ASA: III  Anesthesia Plan: General   Post-op Pain Management:    Induction: Intravenous  PONV Risk Score and Plan: 4 or greater and Ondansetron, Dexamethasone, Midazolam and Scopolamine patch - Pre-op  Airway Management Planned: Oral ETT  Additional Equipment:   Intra-op Plan:   Post-operative Plan: Extubation in OR  Informed Consent: I have reviewed the patients History and Physical, chart, labs and discussed the procedure including the risks, benefits  and alternatives for the proposed anesthesia with the patient or authorized representative who has indicated his/her understanding and acceptance.     Dental advisory given  Plan Discussed with: Anesthesiologist  Anesthesia Plan Comments: (See PAT note 02/21/2020, Jodell Cipro, PA-C)       Anesthesia Quick Evaluation

## 2020-03-02 ENCOUNTER — Encounter (HOSPITAL_COMMUNITY): Payer: Self-pay | Admitting: Surgery

## 2020-03-02 ENCOUNTER — Ambulatory Visit (HOSPITAL_COMMUNITY): Payer: Medicare HMO | Admitting: Physician Assistant

## 2020-03-02 ENCOUNTER — Encounter (HOSPITAL_COMMUNITY)
Admission: RE | Disposition: A | Discharge status: Home / Self Care | Payer: Self-pay | Source: Ambulatory Visit | Attending: Surgery

## 2020-03-02 ENCOUNTER — Ambulatory Visit (HOSPITAL_COMMUNITY)
Admission: RE | Admit: 2020-03-02 | Discharge: 2020-03-02 | Disposition: A | Discharge status: Home / Self Care | Payer: Medicare HMO | Source: Ambulatory Visit | Attending: Surgery | Admitting: Surgery

## 2020-03-02 DIAGNOSIS — Z951 Presence of aortocoronary bypass graft: Secondary | ICD-10-CM | POA: Insufficient documentation

## 2020-03-02 DIAGNOSIS — K8010 Calculus of gallbladder with chronic cholecystitis without obstruction: Secondary | ICD-10-CM | POA: Diagnosis not present

## 2020-03-02 DIAGNOSIS — K8020 Calculus of gallbladder without cholecystitis without obstruction: Secondary | ICD-10-CM | POA: Diagnosis not present

## 2020-03-02 DIAGNOSIS — Z885 Allergy status to narcotic agent status: Secondary | ICD-10-CM | POA: Insufficient documentation

## 2020-03-02 DIAGNOSIS — Z888 Allergy status to other drugs, medicaments and biological substances status: Secondary | ICD-10-CM | POA: Insufficient documentation

## 2020-03-02 DIAGNOSIS — E7800 Pure hypercholesterolemia, unspecified: Secondary | ICD-10-CM | POA: Diagnosis not present

## 2020-03-02 DIAGNOSIS — E119 Type 2 diabetes mellitus without complications: Secondary | ICD-10-CM | POA: Insufficient documentation

## 2020-03-02 DIAGNOSIS — I2510 Atherosclerotic heart disease of native coronary artery without angina pectoris: Secondary | ICD-10-CM | POA: Diagnosis not present

## 2020-03-02 DIAGNOSIS — K819 Cholecystitis, unspecified: Secondary | ICD-10-CM | POA: Diagnosis not present

## 2020-03-02 DIAGNOSIS — I10 Essential (primary) hypertension: Secondary | ICD-10-CM | POA: Diagnosis not present

## 2020-03-02 DIAGNOSIS — R69 Illness, unspecified: Secondary | ICD-10-CM | POA: Diagnosis not present

## 2020-03-02 HISTORY — PX: CHOLECYSTECTOMY: SHX55

## 2020-03-02 LAB — GLUCOSE, CAPILLARY: Glucose-Capillary: 96 mg/dL (ref 70–99)

## 2020-03-02 SURGERY — LAPAROSCOPIC CHOLECYSTECTOMY
Anesthesia: General | Site: Abdomen

## 2020-03-02 MED ORDER — CHLORHEXIDINE GLUCONATE 0.12 % MT SOLN
15.0000 mL | Freq: Once | OROMUCOSAL | Status: AC
  Administered 2020-03-02: 15 mL via OROMUCOSAL

## 2020-03-02 MED ORDER — LIDOCAINE 2% (20 MG/ML) 5 ML SYRINGE
INTRAMUSCULAR | Status: DC | PRN
  Administered 2020-03-02: 60 mg via INTRAVENOUS

## 2020-03-02 MED ORDER — ROCURONIUM BROMIDE 10 MG/ML (PF) SYRINGE
PREFILLED_SYRINGE | INTRAVENOUS | Status: DC | PRN
  Administered 2020-03-02: 70 mg via INTRAVENOUS

## 2020-03-02 MED ORDER — ONDANSETRON HCL 4 MG/2ML IJ SOLN
INTRAMUSCULAR | Status: AC
  Filled 2020-03-02: qty 2

## 2020-03-02 MED ORDER — BUPIVACAINE-EPINEPHRINE (PF) 0.25% -1:200000 IJ SOLN
INTRAMUSCULAR | Status: AC
  Filled 2020-03-02: qty 30

## 2020-03-02 MED ORDER — LACTATED RINGERS IR SOLN
Status: DC | PRN
  Administered 2020-03-02: 1000 mL

## 2020-03-02 MED ORDER — FENTANYL CITRATE (PF) 100 MCG/2ML IJ SOLN
INTRAMUSCULAR | Status: DC | PRN
  Administered 2020-03-02 (×2): 50 ug via INTRAVENOUS
  Administered 2020-03-02: 100 ug via INTRAVENOUS

## 2020-03-02 MED ORDER — MIDAZOLAM HCL 5 MG/5ML IJ SOLN
INTRAMUSCULAR | Status: DC | PRN
  Administered 2020-03-02: 2 mg via INTRAVENOUS

## 2020-03-02 MED ORDER — 0.9 % SODIUM CHLORIDE (POUR BTL) OPTIME
TOPICAL | Status: DC | PRN
  Administered 2020-03-02: 1000 mL

## 2020-03-02 MED ORDER — CHLORHEXIDINE GLUCONATE CLOTH 2 % EX PADS: 6.0000 | MEDICATED_PAD | Freq: Once | CUTANEOUS | Status: DC

## 2020-03-02 MED ORDER — LACTATED RINGERS IV SOLN: INTRAVENOUS | Status: DC

## 2020-03-02 MED ORDER — FENTANYL CITRATE (PF) 100 MCG/2ML IJ SOLN
INTRAMUSCULAR | Status: AC
  Filled 2020-03-02: qty 2

## 2020-03-02 MED ORDER — ORAL CARE MOUTH RINSE: 15.0000 mL | Freq: Once | OROMUCOSAL | Status: AC

## 2020-03-02 MED ORDER — DEXAMETHASONE SODIUM PHOSPHATE 4 MG/ML IJ SOLN
INTRAMUSCULAR | Status: DC | PRN
  Administered 2020-03-02: 5 mg via INTRAVENOUS

## 2020-03-02 MED ORDER — PHENYLEPHRINE 40 MCG/ML (10ML) SYRINGE FOR IV PUSH (FOR BLOOD PRESSURE SUPPORT)
PREFILLED_SYRINGE | INTRAVENOUS | Status: DC | PRN
  Administered 2020-03-02: 120 ug via INTRAVENOUS

## 2020-03-02 MED ORDER — TRAMADOL HCL 50 MG PO TABS: 50.0000 mg | ORAL_TABLET | Freq: Four times a day (QID) | ORAL | 0 refills | Status: AC | PRN

## 2020-03-02 MED ORDER — MIDAZOLAM HCL 2 MG/2ML IJ SOLN
INTRAMUSCULAR | Status: AC
  Filled 2020-03-02: qty 2

## 2020-03-02 MED ORDER — CEFAZOLIN SODIUM-DEXTROSE 2-4 GM/100ML-% IV SOLN
2.0000 g | INTRAVENOUS | Status: AC
  Administered 2020-03-02: 2 g via INTRAVENOUS
  Filled 2020-03-02: qty 100

## 2020-03-02 MED ORDER — SCOPOLAMINE 1 MG/3DAYS TD PT72
MEDICATED_PATCH | TRANSDERMAL | Status: AC
  Filled 2020-03-02: qty 1

## 2020-03-02 MED ORDER — ONDANSETRON HCL 4 MG/2ML IJ SOLN
INTRAMUSCULAR | Status: DC | PRN
  Administered 2020-03-02: 4 mg via INTRAVENOUS

## 2020-03-02 MED ORDER — PHENYLEPHRINE 40 MCG/ML (10ML) SYRINGE FOR IV PUSH (FOR BLOOD PRESSURE SUPPORT)
PREFILLED_SYRINGE | INTRAVENOUS | Status: AC
  Filled 2020-03-02: qty 10

## 2020-03-02 MED ORDER — ACETAMINOPHEN 500 MG PO TABS
1000.0000 mg | ORAL_TABLET | ORAL | Status: AC
  Administered 2020-03-02: 1000 mg via ORAL
  Filled 2020-03-02: qty 2

## 2020-03-02 MED ORDER — ROCURONIUM BROMIDE 10 MG/ML (PF) SYRINGE
PREFILLED_SYRINGE | INTRAVENOUS | Status: AC
  Filled 2020-03-02: qty 10

## 2020-03-02 MED ORDER — DEXAMETHASONE SODIUM PHOSPHATE 10 MG/ML IJ SOLN
INTRAMUSCULAR | Status: AC
  Filled 2020-03-02: qty 1

## 2020-03-02 MED ORDER — SUGAMMADEX SODIUM 200 MG/2ML IV SOLN
INTRAVENOUS | Status: DC | PRN
  Administered 2020-03-02: 200 mg via INTRAVENOUS

## 2020-03-02 MED ORDER — SCOPOLAMINE 1 MG/3DAYS TD PT72
1.0000 | MEDICATED_PATCH | TRANSDERMAL | Status: DC
  Administered 2020-03-02: 1.5 mg via TRANSDERMAL

## 2020-03-02 MED ORDER — PROPOFOL 10 MG/ML IV BOLUS
INTRAVENOUS | Status: DC | PRN
  Administered 2020-03-02: 170 mg via INTRAVENOUS

## 2020-03-02 MED ORDER — LIDOCAINE HCL (PF) 2 % IJ SOLN
INTRAMUSCULAR | Status: AC
  Filled 2020-03-02: qty 5

## 2020-03-02 SURGICAL SUPPLY — 40 items
APPLICATOR ARISTA FLEXITIP XL (MISCELLANEOUS) IMPLANT
APPLIER CLIP 5 13 M/L LIGAMAX5 (MISCELLANEOUS) ×2
APPLIER CLIP ROT 10 11.4 M/L (STAPLE)
CABLE HIGH FREQUENCY MONO STRZ (ELECTRODE) ×2 IMPLANT
CHLORAPREP W/TINT 26 (MISCELLANEOUS) ×2 IMPLANT
CLIP APPLIE 5 13 M/L LIGAMAX5 (MISCELLANEOUS) ×1 IMPLANT
CLIP APPLIE ROT 10 11.4 M/L (STAPLE) IMPLANT
COVER MAYO STAND STRL (DRAPES) IMPLANT
COVER SURGICAL LIGHT HANDLE (MISCELLANEOUS) ×2 IMPLANT
COVER WAND RF STERILE (DRAPES) IMPLANT
DECANTER SPIKE VIAL GLASS SM (MISCELLANEOUS) ×2 IMPLANT
DERMABOND ADVANCED (GAUZE/BANDAGES/DRESSINGS) ×1
DERMABOND ADVANCED .7 DNX12 (GAUZE/BANDAGES/DRESSINGS) ×1 IMPLANT
DISSECTOR BLUNT TIP ENDO 5MM (MISCELLANEOUS) ×2 IMPLANT
DRAPE C-ARM 42X120 X-RAY (DRAPES) IMPLANT
ELECT PENCIL ROCKER SW 15FT (MISCELLANEOUS) ×2 IMPLANT
ELECT REM PT RETURN 15FT ADLT (MISCELLANEOUS) ×2 IMPLANT
GLOVE INDICATOR 8.0 STRL GRN (GLOVE) ×2 IMPLANT
GLOVE SURG ENC MOIS LTX SZ7.5 (GLOVE) ×2 IMPLANT
GOWN STRL REUS W/TWL XL LVL3 (GOWN DISPOSABLE) ×4 IMPLANT
GRASPER SUT TROCAR 14GX15 (MISCELLANEOUS) IMPLANT
HEMOSTAT ARISTA ABSORB 3G PWDR (HEMOSTASIS) IMPLANT
HEMOSTAT SNOW SURGICEL 2X4 (HEMOSTASIS) IMPLANT
KIT BASIN OR (CUSTOM PROCEDURE TRAY) ×2 IMPLANT
NEEDLE INSUFFLATION 14GA 120MM (NEEDLE) IMPLANT
POUCH SPECIMEN RETRIEVAL 10MM (ENDOMECHANICALS) ×2 IMPLANT
SCISSORS LAP 5X35 DISP (ENDOMECHANICALS) ×2 IMPLANT
SET CHOLANGIOGRAPH MIX (MISCELLANEOUS) IMPLANT
SET IRRIG TUBING LAPAROSCOPIC (IRRIGATION / IRRIGATOR) ×2 IMPLANT
SET TUBE SMOKE EVAC HIGH FLOW (TUBING) ×2 IMPLANT
SLEEVE ADV FIXATION 5X100MM (TROCAR) ×4 IMPLANT
SUT MNCRL AB 4-0 PS2 18 (SUTURE) ×2 IMPLANT
SYR 10ML ECCENTRIC (SYRINGE) ×2 IMPLANT
TOWEL OR 17X26 10 PK STRL BLUE (TOWEL DISPOSABLE) ×2 IMPLANT
TOWEL OR NON WOVEN STRL DISP B (DISPOSABLE) IMPLANT
TRAY LAPAROSCOPIC (CUSTOM PROCEDURE TRAY) ×2 IMPLANT
TROCAR ADV FIXATION 12X100MM (TROCAR) IMPLANT
TROCAR ADV FIXATION 5X100MM (TROCAR) ×2 IMPLANT
TROCAR XCEL BLUNT TIP 100MML (ENDOMECHANICALS) ×2 IMPLANT
TROCAR XCEL NON-BLD 11X100MML (ENDOMECHANICALS) IMPLANT

## 2020-03-02 NOTE — Op Note (Signed)
03/02/2020 11:14 AM  PATIENT: Tom Phelps  60 y.o. male  Patient Care Team: Mila Palmer, MD as PCP - General (Family Medicine) Corky Crafts, MD as PCP - Cardiology (Cardiology)  PRE-OPERATIVE DIAGNOSIS: Symptomatic cholelithiasis  POST-OPERATIVE DIAGNOSIS: Same + Chronic cholecystitis  PROCEDURE: Laparoscopic cholecystectomy  SURGEON: Stephanie Coup. Jahara Dail, MD  ASSISTANT: Phylliss Blakes MD  ANESTHESIA: General endotracheal  EBL: Total I/O In: 800 [I.V.:700; IV Piggyback:100] Out: 25 [Blood:25]  DRAINS: None  SPECIMEN: Gallbladder  COUNTS: Sponge, needle and instrument counts were reported correct x2 at the conclusion of the operation  DISPOSITION: PACU in satisfactory condition  COMPLICATIONS: None  FINDINGS: Gallbladder encased in omentum - initially invisible on entry and had to be fully exposed, consistent with prior bout of cholecystitis. Critical view of safety achieved prior to clipping or dividing any structures. Surgicel placed in liver bed at conclusion given his history of plavix use.  DESCRIPTION:   The patient was identified & brought into the operating room. He was then positioned supine on the OR table. SCDs were in place and active during the entire case. He then underwent general endotracheal anesthesia. Pressure points were padded. Hair on the abdomen was clipped by the OR team. The abdomen was prepped and draped in the standard sterile fashion. Antibiotics were administered. A surgical timeout was performed and confirmed our plan.   A periumbilical incision was made. The umbilical stalk was grasped and retracted outwardly. The supraumbilical fascia was identified and incised. The peritoneal cavity was gently entered bluntly. A purse-string 0 Vicryl suture was placed. The Hasson cannula was inserted into the peritoneal cavity and insufflation with CO2 commenced to . A laparoscope was inserted into the peritoneal cavity and inspection confirmed no  evidence of trocar site complications. The patient was then positioned in reverse Trendelenburg with slight left side down. 3 additional 5 mm trocars were placed along the right subcostal line - one 5 mm port in mid subcostal region, another 18mm port in the right flank near the anterior axillary line, and a third 63mm port in the left subxiphoid region obliquely near the falciform ligament.  The liver and gallbladder were inspected. The gallbladder was not immediately visible - it was coated in omentum. The omentum was carefully taken down with combination or sharp dissection and electrocautery taking care to stay clear of the transverse colon, distal stomach and duodenum. The gallbladder was ultimately visualized. The gallbladder fundus was grasped and elevated cephalad. Omentum was then dissected free from the body of the gallbladder working toward the infundibulum.  An additional grasper was then placed on the infundibulum of the gallbladder and the infundibulum was retracted laterally. Staying high on the gallbladder, the peritoneum on both sides of the gallbladder was opened with hook cautery. Gentle blunt dissection using a laparoscopic Kitner was then employed along with a Art gallery manager working down into [employer]. The cystic duct was identified and carefully circumferentially dissected. The cystic artery was also identified and carefully circumferentially dissected. The space between the cystic artery and hepatocystic plate was developed such that a good view of the liver could be seen through a window medial to the cystic artery. The triangle of Calot had been cleared of all fibrofatty tissue. At this point, a critical view of safety was achieved and the only structures visualized was the skeletonized cystic duct laterally, the skeletonized cystic artery and the liver through the window medial to the artery. No posterior cystic artery was noted.  A cholangiogram was not performed  at this  point as the cystic duct exhibited a fragile appearance with a thin wall and concern was present for avulsion or a backwall injury with advancement of a cholangiocatheter.  The cystic duct and artery were clipped with 2 clips on the patient side and 1 clip on the specimen side. The cystic duct and artery were then divided. The gallbladder was then freed from its remaining attachments to the liver using electrocautery and placed into an endocatch bag. The RUQ was gently irrigated with sterile saline. Hemostasis was then verified. The clips were in good position; the gallbladder fossa was dry. Surgicel was placed in the gallbladder fossa given his history of plavix use. The rest of the abdomen was inspected no injury nor bleeding elsewhere was identified.  The endocatch bag containing the gallbladder was then removed from the umbilical port site and passed off as specimen. The RUQ ports were removed under direct visualization and noted to be hemostatic. The umbilical fascia was then closed using the 0 Vicryl purse-string suture. The fascia was palpated and noted to be completely closed. The skin of all incision sites was approximated with 4-0 monocryl subcuticular suture and dermabond applied. He was then awakened from anesthesia, extubated, and transferred to a stretcher for transport to PACU in satisfactory condition.

## 2020-03-02 NOTE — Transfer of Care (Signed)
Immediate Anesthesia Transfer of Care Note  Patient: Tom Phelps  Procedure(s) Performed: LAPAROSCOPIC CHOLECYSTECTOMY (N/A Abdomen)  Patient Location: PACU  Anesthesia Type:General  Level of Consciousness: awake and patient cooperative  Airway & Oxygen Therapy: Patient Spontanous Breathing and Patient connected to face mask  Post-op Assessment: Report given to RN and Post -op Vital signs reviewed and stable  Post vital signs: Reviewed and stable  Last Vitals:  Vitals Value Taken Time  BP 141/84 03/02/20 1121  Temp    Pulse 67 03/02/20 1124  Resp 14 03/02/20 1124  SpO2 100 % 03/02/20 1124  Vitals shown include unvalidated device data.  Last Pain:  Vitals:   03/02/20 0813  TempSrc:   PainSc: 0-No pain         Complications: No complications documented.

## 2020-03-02 NOTE — H&P (Signed)
CC: Here today for surgery  HPI: Mr. Holloran is a very pleasant 59yoM with hx of pre-diabetes, carpal tunnel, CAD (s/p CABG last year) who presented to the emergency department twice in the last 2 months for evaluation of midepigastric abdominal pain. He reports episodic attacks of this pain better sharp/Boring pains located in the midepigastrium and right upper quadrant. There are mildly brought on by greasy or fatty foods and helped by avoiding these kinds of foods. He denies any fever/chills/nausea/vomiting. He reports that prior to a couple of months ago, he never had these kinds of pains before. He was seen in emergency department in July and again 10/01/19. He had a CT scan performed of the abdomen and pelvis that demonstrated gallstones without evidence for acute cholecystitis. No biliary ductal dilatation. His LFTs were normal. His Nathyn Luiz count was normal. His lipase is normal. He was discharged with plans to follow-up in our office. He is here today. He reports stable symptoms reliably brought on by fatty foods. The pain is not clearly radiate. Nothing aside from avoiding these kinds of things helps.   INTERVAL HX He denies any changes in his health or health history; off plavix. States he is ready for surgery.  PMH: Pre-DM, CAD (s/p CABG; on plavix), carpal tunnel  PSH: CABG. He denies any abdominal or pelvic surgery.  FHx: Denies FHx of colorectal, breast, endometrial, ovarian or cervical cancer  Social: Denies use of tobacco/EtOH/drugs. He is on disability. Previously worked in Sports [employer]  ROS: A comprehensive 10 system review of systems was completed with the patient and pertinent findings as noted above.   Past Medical History:  Diagnosis Date  . Actinic keratoses   . Ankle dislocation    calcaneal tendons  . Anxiety   . Aortic atherosclerosis (HCC)   . Chronic pain of left knee 11/18/2017  . Coronary artery disease   . DDD (degenerative  disc disease), cervical   . Diabetes mellitus without complication (HCC) 2019   no meds  . Displaced other extraarticular fracture of right calcaneus, initial encounter for closed fracture 07/13/2017  . DOE (dyspnea on exertion)   . Dyspnea   . Fatty liver    mild  . Fracture of distal end of right fibula   . Gallstones   . History of kidney stones   . Hypercholesteremia   . Hypercholesterolemia   . Hypertension   . Lightheadedness   . Neck pain    due to work   . Neuromuscular disorder (HCC)    carpal tunnel bi lat  . Prediabetes   . Pulmonary nodules   . Stress reaction   . Stroke (HCC)    2018 no residuals  . TIA (transient ischemic attack)    circulation     Past Surgical History:  Procedure Laterality Date  . BACK SURGERY  2005   L4-L5 fusion  . CORONARY ARTERY BYPASS GRAFT N/A 07/29/2018   Procedure: CORONARY ARTERY BYPASS GRAFTING (CABG) x 2;  Surgeon: Corliss Skains, MD;  Location: [facility] OR;  Service: Open Heart Surgery;  Laterality: N/A;  . ENDOVEIN HARVEST OF GREATER SAPHENOUS VEIN Left 07/29/2018   Procedure: Endovein Harvest Of Greater Saphenous Vein Left Thigh; Exploration only of right leg greater saphenous vein;  Surgeon: Corliss Skains, MD;  Location: [facility] OR;  Service: Open Heart Surgery;  Laterality: Left;  . INTRAVASCULAR PRESSURE WIRE/FFR STUDY N/A 07/20/2018   Procedure: INTRAVASCULAR PRESSURE WIRE/FFR STUDY;  Surgeon: Corky Crafts, MD;  Location: [facility]  INVASIVE CV LAB;  Service: Cardiovascular;  Laterality: N/A;  . LEFT HEART CATH AND CORONARY ANGIOGRAPHY N/A 07/20/2018   Procedure: LEFT HEART CATH AND CORONARY ANGIOGRAPHY;  Surgeon: Corky Crafts, MD;  Location: [facility] INVASIVE CV LAB;  Service: Cardiovascular;  Laterality: N/A;  . TEE WITHOUT CARDIOVERSION N/A 07/29/2018   Procedure: TRANSESOPHAGEAL ECHOCARDIOGRAM (TEE);  Surgeon: Corliss Skains, MD;  Location: [facility] OR;  Service: Open Heart Surgery;  Laterality: N/A;    Family History   Problem Relation Age of Onset  . CAD Mother   . CAD Father     Social:  reports that he has never smoked. He has never used smokeless tobacco. He reports that he does not drink alcohol and does not use drugs.  Allergies:  Allergies  Allergen Reactions  . Metformin And Related Shortness Of Breath and Other (See Comments)    Pressure on heart  . Metformin Hcl     Other reaction(s): SOB  . Oxycodone Itching  . Oxycodone Hcl     Other reaction(s): itching    Medications: I have reviewed the patient's current medications.  Results for orders placed or performed during the hospital encounter of 03/02/20 (from the past 48 hour(s))  Glucose, capillary     Status: None   Collection Time: 03/02/20  8:05 AM  Result Value Ref Range   Glucose-Capillary 96 70 - 99 mg/dL    Comment: Glucose reference range applies only to samples taken after fasting for at least 8 hours.    No results found.  ROS - all of the below systems have been reviewed with the patient and positives are indicated with bold text General: chills, fever or night sweats Eyes: blurry vision or double vision ENT: epistaxis or sore throat Allergy/Immunology: itchy/watery eyes or nasal congestion Hematologic/Lymphatic: bleeding problems, blood clots or swollen lymph nodes Endocrine: temperature intolerance or unexpected weight changes Breast: new or changing breast lumps or nipple discharge Resp: cough, shortness of breath, or wheezing CV: chest pain or dyspnea on exertion GI: as per HPI GU: dysuria, trouble voiding, or hematuria MSK: joint pain or joint stiffness Neuro: TIA or stroke symptoms Derm: pruritus and skin lesion changes Psych: anxiety and depression  PE Blood pressure 135/86, pulse 67, temperature 97.6 F (36.4 C), temperature source Oral, resp. rate 18, height 5\' 10"  (1.778 m), weight 87.1 kg, SpO2 99 %. Constitutional: NAD; conversant; wearing mask Eyes: Moist conjunctiva; no lid lag;  anicteric Lungs: Normal respiratory effort CV: RRR GI: Abd soft, NT/ND; no palpable hepatosplenomegaly MSK: Normal range of motion of extremities Psychiatric: Appropriate affect; alert and oriented x3  Results for orders placed or performed during the hospital encounter of 03/02/20 (from the past 48 hour(s))  Glucose, capillary     Status: None   Collection Time: 03/02/20  8:05 AM  Result Value Ref Range   Glucose-Capillary 96 70 - 99 mg/dL    Comment: Glucose reference range applies only to samples taken after fasting for at least 8 hours.    No results found.   A/P: Mr. Rumpf is a very pleasant 60yoM with hx pre-DM, CAD (s/p CABG, on plavix) here for surgery Everardo Beals cholelithiasis  -Off plavix x 5 days -The anatomy and physiology of the hepatobiliary system was reviewed as well as the pathophysiology of gallbladder disease. -The options for treatment were discussed including ongoing observation which may result in subsequent gallbladder complications (infection, pancreatitis, choledocholithiasis, etc) and surgery - cholecystectomy - laparoscopic and potential open techniques. We also reviewed  scenarios where subtotal cholecystectomy could be indicated. -The planned procedures, material risks (including, but not limited to, pain, bleeding, infection, scarring, need for blood transfusion, damage to surrounding structures- blood vessels/nerves/viscus/organs, damage to bile duct, bile leak, need for additional procedures, hernia, pancreatitis, pneumonia, heart attack, stroke, death) benefits and alternatives to surgery were discussed at length. We noted a good probability that the procedure would help improve his symptoms. [name] questions were answered to his satisfaction, he voiced understanding and they elected to proceed with surgery. Additionally, we discussed typical postoperative expectations and the recovery process.  Stephanie Coup. Cliffton Asters, M.D. [facility] Use [email or web address] to contact on call provider

## 2020-03-02 NOTE — Anesthesia Postprocedure Evaluation (Signed)
Anesthesia Post Note  Patient: Tom Phelps  Procedure(s) Performed: LAPAROSCOPIC CHOLECYSTECTOMY (N/A Abdomen)     Patient location during evaluation: PACU Anesthesia Type: General Level of consciousness: sedated Pain management: pain level controlled Vital Signs Assessment: post-procedure vital signs reviewed and stable Respiratory status: spontaneous breathing and respiratory function stable Cardiovascular status: stable Postop Assessment: no apparent nausea or vomiting Anesthetic complications: no   No complications documented.  Last Vitals:  Vitals:   03/02/20 1215 03/02/20 1300  BP: (!) 141/79 138/82  Pulse: (!) 57 60  Resp: 15 16  Temp: (!) 36.4 C 36.6 C  SpO2: 92% 94%    Last Pain:  Vitals:   03/02/20 1300  TempSrc:   PainSc: 0-No pain                 SINGER,JAMES DANIEL

## 2020-03-02 NOTE — Discharge Instructions (Addendum)
POST OP INSTRUCTIONS  1. DIET: As tolerated. Follow a light bland diet the first 24 hours after arrival home, such as soup, liquids, crackers, etc.  Be sure to include lots of fluids daily.  Avoid fast food or heavy meals as your are more likely to get nauseated.  Eat a low fat the next few days after surgery.  2. Take your usually prescribed home medications unless otherwise directed - as for the Plavix, you may resume this medication in 48 hours.  3. PAIN CONTROL: a. Pain is best controlled by a usual combination of three different methods TOGETHER: i. Ice/Heat ii. Over the counter pain medication iii. Prescription pain medication b. Most patients will experience some swelling and bruising around the surgical site.  Ice packs or heating pads (30-60 minutes up to 6 times a day) will help. Some people prefer to use ice alone, heat alone, alternating between ice & heat.  Experiment to what works for you.  Swelling and bruising can take several weeks to resolve.   c. It is helpful to take an over-the-counter pain medication regularly for the first few weeks: i. Acetaminophen (Tylenol) - you may take 650 mg every 6 hours as needed. You can take this with motrin as they act differently on the body. If you are taking a narcotic pain medication that has acetaminophen in it, do not take over the counter tylenol at the same time. ii. Avoid NSAIDs (ibuprofen, motrin, alleve, etc) given the mild kidney impairment you have d. A  prescription for pain medication should be given to you upon discharge.  Take your pain medication as prescribed if your pain is not adequatly controlled with the over-the-counter pain reliefs mentioned above.  4. Avoid getting constipated.  Between the surgery and the pain medications, it is common to experience some constipation.  Increasing fluid intake and taking a fiber supplement (such as Metamucil, Citrucel, FiberCon, MiraLax, etc) 1-2 times a day regularly will usually help  prevent this problem from occurring.  A mild laxative (prune juice, Milk of Magnesia, MiraLax, etc) should be taken according to package directions if there are no bowel movements after 48 hours.    5. Dressing: Your incisions are covered in Dermabond which is like sterile superglue for the skin. This will come off on it's own in a couple weeks. It is waterproof and you may bathe normally starting the day after your surgery in a shower. Avoid baths/pools/lakes/oceans until your wounds have fully healed.  6. ACTIVITIES as tolerated:   a. Avoid heavy lifting (>10lbs or 1 gallon of milk) for the next 6 weeks. b. You may resume regular (light) daily activities beginning the next day--such as daily self-care, walking, climbing stairs--gradually increasing activities as tolerated.  If you can walk 30 minutes without difficulty, it is safe to try more intense activity such as jogging, treadmill, bicycling, low-impact aerobics.  c. DO NOT PUSH THROUGH PAIN.  Let pain be your guide: If it hurts to do something, don't do it. d. Tom Phelps may drive when you are no longer taking prescription pain medication, you can comfortably wear a seatbelt, and you can safely maneuver your car and apply brakes.   7. FOLLOW UP in our office a. Please call [facility] at [phone] to set up an appointment to see your surgeon in the office for a follow-up appointment approximately 2 weeks after your surgery. b. Make sure that you call for this appointment the day you arrive home to insure a convenient appointment  time.  9. If you have disability or family leave forms that need to be completed, you may have them completed by your primary care physician's office; for return to work instructions, please ask our office staff and they will be happy to assist you in obtaining this documentation   When to call us [phone]: 1. Poor pain control 2. Reactions / problems with new medications (rash/itching, etc)  3. Fever over 101.5 F  (38.5 C) 4. Inability to urinate 5. Nausea/vomiting 6. Worsening swelling or bruising 7. Continued bleeding from incision. 8. Increased pain, redness, or drainage from the incision  The clinic staff is available to answer your questions during regular business hours (8:30am-5pm).  Please don't hesitate to call and ask to speak to one of our nurses for clinical concerns.   A surgeon from [facility] is always on call at the hospitals   If you have a medical emergency, go to the nearest emergency room or call 911.  [facility] [address], [address], [place], [place]  [address] MAIN: [phone] FAX: [phone] [email or web address]   General Anesthesia, Adult, Care After This sheet gives you information about how to care for yourself after your procedure. Your health care provider may also give you more specific instructions. If you have problems or questions, contact your health care provider. What can I expect after the procedure? After the procedure, the following side effects are common:  Pain or discomfort at the IV site.  Nausea.  Vomiting.  Sore throat.  Trouble concentrating.  Feeling cold or chills.  Feeling weak or tired.  Sleepiness and fatigue.  Soreness and body aches. These side effects can affect parts of the body that were not involved in surgery. Follow these instructions at home: For the time period you were told by your health care provider:  Rest.  Do not participate in activities where you could fall or become injured.  Do not drive or use machinery.  Do not drink alcohol.  Do not take sleeping pills or medicines that cause drowsiness.  Do not make important decisions or sign legal documents.  Do not take care of children on your own.   Eating and drinking  Follow any instructions from your health care provider about eating or drinking restrictions.  When you feel hungry, start by  eating small amounts of foods that are soft and easy to digest (bland), such as toast. Gradually return to your regular diet.  Drink enough fluid to keep your urine pale yellow.  If you vomit, rehydrate by drinking water, juice, or clear broth. General instructions  If you have sleep apnea, surgery and certain medicines can increase your risk for breathing problems. Follow instructions from your health care provider about wearing your sleep device: ? Anytime you are sleeping, including during daytime naps. ? While taking prescription pain medicines, sleeping medicines, or medicines that make you drowsy.  Have a responsible adult stay with you for the time you are told. It is important to have someone help care for you until you are awake and alert.  Return to your normal activities as told by your health care provider. Ask your health care provider what activities are safe for you.  Take over-the-counter and prescription medicines only as told by your health care provider.  If you smoke, do not smoke without supervision.  Keep all follow-up visits as told by your health care provider. This is important. Contact a health care provider if:  You have nausea or vomiting that does not get better with medicine.  You cannot eat or drink without vomiting.  You have pain that does not get better with medicine.  You are unable to pass urine.  You develop a skin rash.  You have a fever.  You have redness around your IV site that gets worse. Get help right away if:  You have difficulty breathing.  You have chest pain.  You have blood in your urine or stool, or you vomit blood. Summary  After the procedure, it is common to have a sore throat or nausea. It is also common to feel tired.  Have a responsible adult stay with you for the time you are told. It is important to have someone help care for you until you are awake and alert.  When you feel hungry, start by eating small amounts  of foods that are soft and easy to digest (bland), such as toast. Gradually return to your regular diet.  Drink enough fluid to keep your urine pale yellow.  Return to your normal activities as told by your health care provider. Ask your health care provider what activities are safe for you. This information is not intended to replace advice given to you by your health care provider. Make sure you discuss any questions you have with your health care provider. Document Revised: 09/08/2019 Document Reviewed: 04/07/2019 Elsevier Patient Education  2021 [employer].

## 2020-03-02 NOTE — Anesthesia Procedure Notes (Signed)
Procedure Name: Intubation Date/Time: 03/02/2020 9:53 AM Performed by: Vanessa Rutherford, CRNA Pre-anesthesia Checklist: Patient identified, Emergency Drugs available, Suction available and Patient being monitored Patient Re-evaluated:Patient Re-evaluated prior to induction Oxygen Delivery Method: Circle system utilized Preoxygenation: Pre-oxygenation with 100% oxygen Induction Type: IV induction Ventilation: Mask ventilation without difficulty Laryngoscope Size: 2 and Miller Grade View: Grade I Tube type: Oral Tube size: 7.5 mm Number of attempts: 1 Airway Equipment and Method: Stylet Placement Confirmation: ETT inserted through vocal cords under direct vision,  positive ETCO2 and breath sounds checked- equal and bilateral Secured at: 21 cm Tube secured with: Tape Dental Injury: Teeth and Oropharynx as per pre-operative assessment

## 2020-03-03 ENCOUNTER — Encounter (HOSPITAL_COMMUNITY): Payer: Self-pay | Admitting: Surgery

## 2020-03-05 LAB — SURGICAL PATHOLOGY

## 2020-03-08 DIAGNOSIS — M47816 Spondylosis without myelopathy or radiculopathy, lumbar region: Secondary | ICD-10-CM | POA: Diagnosis not present

## 2020-03-15 DIAGNOSIS — M545 Low back pain, unspecified: Secondary | ICD-10-CM | POA: Diagnosis not present

## 2020-03-15 DIAGNOSIS — Z794 Long term (current) use of insulin: Secondary | ICD-10-CM | POA: Diagnosis not present

## 2020-03-15 DIAGNOSIS — G459 Transient cerebral ischemic attack, unspecified: Secondary | ICD-10-CM | POA: Diagnosis not present

## 2020-03-15 DIAGNOSIS — E119 Type 2 diabetes mellitus without complications: Secondary | ICD-10-CM | POA: Diagnosis not present

## 2020-03-15 DIAGNOSIS — R69 Illness, unspecified: Secondary | ICD-10-CM | POA: Diagnosis not present

## 2020-03-29 ENCOUNTER — Encounter: Payer: Self-pay | Admitting: Neurology

## 2020-04-01 DIAGNOSIS — I2510 Atherosclerotic heart disease of native coronary artery without angina pectoris: Secondary | ICD-10-CM

## 2020-04-01 DIAGNOSIS — R0602 Shortness of breath: Secondary | ICD-10-CM

## 2020-04-02 NOTE — Telephone Encounter (Signed)
Dr Eldridge Dace would like patient to also have d dimer checked

## 2020-04-03 ENCOUNTER — Other Ambulatory Visit: Payer: Self-pay

## 2020-04-03 ENCOUNTER — Other Ambulatory Visit: Payer: Medicare HMO

## 2020-04-03 DIAGNOSIS — I2510 Atherosclerotic heart disease of native coronary artery without angina pectoris: Secondary | ICD-10-CM | POA: Diagnosis not present

## 2020-04-03 DIAGNOSIS — R0602 Shortness of breath: Secondary | ICD-10-CM | POA: Diagnosis not present

## 2020-04-03 LAB — D-DIMER, QUANTITATIVE: D-DIMER: 0.38 mg/L FEU (ref 0.00–0.49)

## 2020-04-04 ENCOUNTER — Telehealth: Payer: Self-pay | Admitting: Interventional Cardiology

## 2020-04-04 LAB — BASIC METABOLIC PANEL
BUN/Creatinine Ratio: 13 (ref 10–24)
BUN: 14 mg/dL (ref 8–27)
CO2: 22 mmol/L (ref 20–29)
Calcium: 9.5 mg/dL (ref 8.6–10.2)
Chloride: 104 mmol/L (ref 96–106)
Creatinine, Ser: 1.1 mg/dL (ref 0.76–1.27)
Glucose: 137 mg/dL — ABNORMAL HIGH (ref 65–99)
Potassium: 4.2 mmol/L (ref 3.5–5.2)
Sodium: 142 mmol/L (ref 134–144)
eGFR: 76 mL/min/{1.73_m2} (ref >59)

## 2020-04-04 LAB — PRO B NATRIURETIC PEPTIDE: NT-Pro BNP: 159 pg/mL (ref 0–210)

## 2020-04-04 NOTE — Telephone Encounter (Signed)
Follow Up:     Pt is returning a call, concerning his lab results.

## 2020-04-04 NOTE — Telephone Encounter (Signed)
I spoke with patient and reviewed BNP, BMP and D dimer results with him

## 2020-04-09 NOTE — Progress Notes (Signed)
NEUROLOGY CONSULTATION NOTE  Tom Phelps MRN: [id number] DOB: 1960/03/14  Referring provider: Mila Palmer, MD Primary care provider: Mila Palmer, MD  Reason for consult:  TIA  Assessment/Plan:   1.  History of vertigo, suspected to be secondary to posterior circulation transient ischemic attack - no residual symptoms 2.  Mood disorder -  I do not think this is related to prior TIA.  Possibly depression 3.  HTN 4.  HLD 5.  CAD  1.  Follow up with PCP regarding management of mood/depression 2.  Continue secondary stroke prevention as managed by his other physicians: - Antiplatelet therapy - Statin therapy.  LDL goal less than 70 - Glycemic control.  Hgb A1c goal less than 7 - Blood pressure control 3.  Follow up as needed.   Subjective:  Tom Phelps is a 60 year old right-handed male HTN, diabetes, hypercholesterolemia who presents for TIA and mood changes.  History supplemented by hospital and referring provider's notes.  He had a reported posterior circulation TIA in December 2018 in [place], [place].  He woke up one morning with severe vertigo and nausea and was falling over to the left side.  Symptoms improved but returned later that day while at work.  Described as room spinning.  Not positional.  No otalgia, tinnitus, facial droop, double vision, unilateral numbness or weakness.  No nystagmus was appreciated.  CT head was unremarkable..  Follow up MRI of brain showed several punctate T2/FLAIR foci in the supratentorial white matter bilaterally likely representing chronic small vessel ischemic changes but no acute abnormalities.  MRA of head and neck showed occluded A1 segment of right ACA, suspected to be chronic, but no evidence of occlusion or stenosis of the vertebrobasilar system.  EKG was normal.  Echocardiogram with EF 55-60%.  Hgb A1c was 6.4.  LDL was 145.2 and was started on ASA 81mg  daily and Zocor.     No recurrent symptoms since then.  He has been on Plavix since CABG  in summer 2020.  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  He reports feeling irritable often although he doesn't consciously feel sad.  He wonders if this may be related to the TIA.  Currently taking:  ASA 81mg , Plavix 75mg , atorvastatin 80mg , Lopressor  PAST MEDICAL HISTORY: Past Medical History:  Diagnosis Date  . Actinic keratoses   . Ankle dislocation    calcaneal tendons  . Anxiety   . Aortic atherosclerosis (HCC)   . Chronic pain of left knee 11/18/2017  . Coronary artery disease   . DDD (degenerative disc disease), cervical   . Diabetes mellitus without complication (HCC) 2019   no meds  . Displaced other extraarticular fracture of right calcaneus, initial encounter for closed fracture 07/13/2017  . DOE (dyspnea on exertion)   . Dyspnea   . Fatty liver    mild  . Fracture of distal end of right fibula   . Gallstones   . History of kidney stones   . Hypercholesteremia   . Hypercholesterolemia   . Hypertension   . Lightheadedness   . Neck pain    due to work   . Neuromuscular disorder (HCC)    carpal tunnel bi lat  . Prediabetes   . Pulmonary nodules   . Stress reaction   . Stroke [facility])  2018 no residuals  . TIA (transient ischemic attack)    circulation     PAST SURGICAL HISTORY: Past Surgical History:  Procedure Laterality Date  . BACK SURGERY  2005   L4-L5 fusion  . CHOLECYSTECTOMY N/A 03/02/2020   Procedure: LAPAROSCOPIC CHOLECYSTECTOMY;  Surgeon: Andria Meuse, MD;  Location: [facility];  Service: General;  Laterality: N/A;  . CORONARY ARTERY BYPASS GRAFT N/A 07/29/2018   Procedure: CORONARY ARTERY BYPASS GRAFTING (CABG) x 2;  Surgeon: Tom Skains, MD;  Location: [facility] OR;  Service: Open Heart Surgery;  Laterality: N/A;  . ENDOVEIN HARVEST OF GREATER SAPHENOUS VEIN Left  07/29/2018   Procedure: Endovein Harvest Of Greater Saphenous Vein Left Thigh; Exploration only of right leg greater saphenous vein;  Surgeon: Tom Skains, MD;  Location: [facility] OR;  Service: Open Heart Surgery;  Laterality: Left;  . INTRAVASCULAR PRESSURE WIRE/FFR STUDY N/A 07/20/2018   Procedure: INTRAVASCULAR PRESSURE WIRE/FFR STUDY;  Surgeon: Corky Crafts, MD;  Location: [facility] INVASIVE CV LAB;  Service: Cardiovascular;  Laterality: N/A;  . LEFT HEART CATH AND CORONARY ANGIOGRAPHY N/A 07/20/2018   Procedure: LEFT HEART CATH AND CORONARY ANGIOGRAPHY;  Surgeon: Corky Crafts, MD;  Location: [facility] INVASIVE CV LAB;  Service: Cardiovascular;  Laterality: N/A;  . TEE WITHOUT CARDIOVERSION N/A 07/29/2018   Procedure: TRANSESOPHAGEAL ECHOCARDIOGRAM (TEE);  Surgeon: Tom Skains, MD;  Location: [facility] OR;  Service: Open Heart Surgery;  Laterality: N/A;    MEDICATIONS: Current Outpatient Medications on File Prior to Visit  Medication Sig Dispense Refill  . aspirin EC 81 MG tablet Take 81 mg by mouth daily.    Marland Kitchen atorvastatin (LIPITOR) 80 MG tablet Take 1 tablet by mouth once daily (Patient taking differently: Take 80 mg by mouth daily.) 90 tablet 2  . clopidogrel (PLAVIX) 75 MG tablet Take 75 mg by mouth daily.    Marland Kitchen ibuprofen (ADVIL) 800 MG tablet Take 800 mg by mouth 3 (three) times daily as needed for mild pain or moderate pain.     . metoprolol tartrate (LOPRESSOR) 25 MG tablet Take 1 tablet (25 mg total) by mouth 2 (two) times daily. 180 tablet 2  . predniSONE (DELTASONE) 10 MG tablet Take 10 mg by mouth See admin instructions. Tapered dose     No current facility-administered medications on file prior to visit.    ALLERGIES: Allergies  Allergen Reactions  . Metformin And Related Shortness Of Breath and Other (See Comments)    Pressure on heart  . Metformin Hcl     Other reaction(s): SOB  . Oxycodone Itching  . Oxycodone Hcl     Other reaction(s): itching    FAMILY  HISTORY: Family History  Problem Relation Age of Onset  . CAD Mother   . CAD Father     Objective:  Blood pressure 132/72, pulse 60, resp. rate 18, height 5\' 10"  (1.778 m), weight 200 lb (90.7 kg), SpO2 98 %. General: No acute distress.  Patient appears well-groomed.   Head:  Normocephalic/atraumatic Eyes:  fundi examined but not visualized Neck: supple, no paraspinal tenderness, full range of motion Back: No paraspinal tenderness Heart: regular rate and rhythm Lungs: Clear to auscultation bilaterally. Vascular: No carotid bruits. Neurological Exam: Mental status: alert and oriented to person, place, and time, recent and remote memory intact, fund of knowledge intact, attention and concentration intact, speech fluent and not dysarthric, language intact. Cranial nerves: CN I: not tested CN II: pupils equal, round and reactive to light, visual fields intact  CN III, IV, VI:  full range of motion, no nystagmus, no ptosis CN V: facial sensation intact. CN VII: upper and lower face symmetric CN VIII: hearing intact CN IX, X: gag intact, uvula midline CN XI: sternocleidomastoid and trapezius muscles intact CN XII: tongue midline Bulk & Tone: normal, no fasciculations. Motor:  muscle strength 5/5 throughout Sensation:  Pinprick and vibratory sensation reduced in right first toe (possibly related to chronic radiculopathy), otherwise unremarkable Deep Tendon Reflexes:  2+ throughout,  toes downgoing.   Finger to nose testing:  Without dysmetria.   Heel to shin:  Without dysmetria.   Gait:  Normal station and stride.  Romberg negative.    Thank you for allowing me to take part in the care of this patient.  Shon Millet, DO  CC: Tom Palmer, MD

## 2020-04-10 ENCOUNTER — Ambulatory Visit: Payer: Medicare HMO | Admitting: Neurology

## 2020-04-10 ENCOUNTER — Other Ambulatory Visit: Payer: Self-pay

## 2020-04-10 ENCOUNTER — Encounter: Payer: Self-pay | Admitting: Neurology

## 2020-04-10 VITALS — BP 132/72 | HR 60 | Resp 18 | Ht 70.0 in | Wt 200.0 lb

## 2020-04-10 DIAGNOSIS — E785 Hyperlipidemia, unspecified: Secondary | ICD-10-CM | POA: Diagnosis not present

## 2020-04-10 DIAGNOSIS — R454 Irritability and anger: Secondary | ICD-10-CM | POA: Diagnosis not present

## 2020-04-10 DIAGNOSIS — Z951 Presence of aortocoronary bypass graft: Secondary | ICD-10-CM | POA: Diagnosis not present

## 2020-04-10 DIAGNOSIS — Z8673 Personal history of transient ischemic attack (TIA), and cerebral infarction without residual deficits: Secondary | ICD-10-CM | POA: Diagnosis not present

## 2020-04-10 DIAGNOSIS — I10 Essential (primary) hypertension: Secondary | ICD-10-CM | POA: Diagnosis not present

## 2020-04-10 DIAGNOSIS — R69 Illness, unspecified: Secondary | ICD-10-CM | POA: Diagnosis not present

## 2020-04-10 NOTE — Patient Instructions (Signed)
I don't think that the irritability or mood changes is related to the mini stroke.  Continue current management for secondary stroke prevention

## 2020-05-01 ENCOUNTER — Ambulatory Visit (HOSPITAL_COMMUNITY): Payer: Medicare HMO | Attending: Cardiology

## 2020-05-01 ENCOUNTER — Other Ambulatory Visit: Payer: Self-pay

## 2020-05-01 DIAGNOSIS — I2510 Atherosclerotic heart disease of native coronary artery without angina pectoris: Secondary | ICD-10-CM | POA: Diagnosis not present

## 2020-05-01 DIAGNOSIS — R0602 Shortness of breath: Secondary | ICD-10-CM | POA: Insufficient documentation

## 2020-05-01 LAB — ECHOCARDIOGRAM COMPLETE
Area-P 1/2: 3.91 cm2
S' Lateral: 3.25 cm

## 2020-05-01 MED ORDER — PERFLUTREN LIPID MICROSPHERE
1.0000 mL | INTRAVENOUS | Status: AC | PRN
  Administered 2020-05-01: 1 mL via INTRAVENOUS

## 2020-05-06 DIAGNOSIS — R5383 Other fatigue: Secondary | ICD-10-CM

## 2020-05-08 NOTE — Telephone Encounter (Signed)
Per chart and [facility] last CBC was 02/21/20.  Per [facility] last TSH was 10/14/18. My chart message sent to patient

## 2020-05-10 ENCOUNTER — Other Ambulatory Visit: Payer: Self-pay | Admitting: Interventional Cardiology

## 2020-05-14 ENCOUNTER — Other Ambulatory Visit: Payer: Self-pay

## 2020-05-14 ENCOUNTER — Other Ambulatory Visit: Payer: Medicare Other | Admitting: *Deleted

## 2020-05-14 DIAGNOSIS — R5383 Other fatigue: Secondary | ICD-10-CM | POA: Diagnosis not present

## 2020-05-14 LAB — CBC
Hematocrit: 39.3 % (ref 37.5–51.0)
Hemoglobin: 13.4 g/dL (ref 13.0–17.7)
MCH: 30.9 pg (ref 26.6–33.0)
MCHC: 34.1 g/dL (ref 31.5–35.7)
MCV: 91 fL (ref 79–97)
Platelets: 252 10*3/uL (ref 150–450)
RBC: 4.34 x10E6/uL (ref 4.14–5.80)
RDW: 13 % (ref 11.6–15.4)
WBC: 5.6 10*3/uL (ref 3.4–10.8)

## 2020-05-14 LAB — TSH: TSH: 2 u[IU]/mL (ref 0.450–4.500)

## 2020-05-16 ENCOUNTER — Telehealth: Payer: Self-pay | Admitting: *Deleted

## 2020-05-16 NOTE — Telephone Encounter (Signed)
Result message has been sent to patient asking if he is still having shortness of breath.  Patient has not responded to message.  I placed call to patient to see if he is still having shortness of breath.  Left message to call office.

## 2020-05-16 NOTE — Telephone Encounter (Signed)
-----   Message from Corky Crafts, MD sent at 05/14/2020  8:51 PM EDT ----- Hbg and TSH are in the normal range. COntinue to monitor sx.  Will consider ischemic eval if DOE does not improve.

## 2020-05-17 NOTE — Telephone Encounter (Signed)
Pt is returning call.  

## 2020-05-17 NOTE — Telephone Encounter (Signed)
I spoke with patient.  He reports the shortness of breath is usually when he is leaning over and picking things up.  He is due for follow up in May with Dr Eldridge Dace.  I scheduled patient to see Dr Eldridge Dace on May 28, 2020 at 3:20

## 2020-05-25 ENCOUNTER — Telehealth: Payer: Self-pay

## 2020-05-25 NOTE — Telephone Encounter (Signed)
Patient agrees to switch to virtual visit on 5/23.    Patient Consent for Virtual Visit         Tom Phelps has provided verbal consent on 05/25/2020 for a virtual visit (video or telephone).   CONSENT FOR VIRTUAL VISIT FOR:  Tom Phelps  By participating in this virtual visit I agree to the following:  I hereby voluntarily request, consent and authorize [facility] and its employed or contracted physicians, Producer, television/film/video, nurse practitioners or other licensed health care professionals (the Practitioner), to provide me with telemedicine health care services (the "Services") as deemed necessary by the treating Practitioner. I acknowledge and consent to receive the Services by the Practitioner via telemedicine. I understand that the telemedicine visit will involve communicating with the Practitioner through live audiovisual communication technology and the disclosure of certain medical information by electronic transmission. I acknowledge that I have been given the opportunity to request an in-person assessment or other available alternative prior to the telemedicine visit and am voluntarily participating in the telemedicine visit.  I understand that I have the right to withhold or withdraw my consent to the use of telemedicine in the course of my care at any time, without affecting my right to future care or treatment, and that the Practitioner or I may terminate the telemedicine visit at any time. I understand that I have the right to inspect all information obtained and/or recorded in the course of the telemedicine visit and may receive copies of available information for a reasonable fee.  I understand that some of the potential risks of receiving the Services via telemedicine include:  Marland Kitchen Delay or interruption in medical evaluation due to technological equipment failure or disruption; . Information transmitted may not be sufficient (e.g. poor resolution of images) to allow for appropriate  medical decision making by the Practitioner; and/or  . In rare instances, security protocols could fail, causing a breach of personal health information.  Furthermore, I acknowledge that it is my responsibility to provide information about my medical history, conditions and care that is complete and accurate to the best of my ability. I acknowledge that Practitioner's advice, recommendations, and/or decision may be based on factors not within their control, such as incomplete or inaccurate data provided by me or distortions of diagnostic images or specimens that may result from electronic transmissions. I understand that the practice of medicine is not an exact science and that Practitioner makes no warranties or guarantees regarding treatment outcomes. I acknowledge that a copy of this consent can be made available to me via my patient portal [facility] MyChart), or I can request a printed copy by calling the office of [facility].    I understand that my insurance will be billed for this visit.   I have read or had this consent read to me. . I understand the contents of this consent, which adequately explains the benefits and risks of the Services being provided via telemedicine.  . I have been provided ample opportunity to ask questions regarding this consent and the Services and have had my questions answered to my satisfaction. . I give my informed consent for the services to be provided through the use of telemedicine in my medical care

## 2020-05-27 NOTE — Progress Notes (Signed)
Virtual Visit via Video Note   This visit type was conducted due to national recommendations for restrictions regarding the COVID-19 Pandemic (e.g. social distancing) in an effort to limit this patient's exposure and mitigate transmission in our community.  Due to his co-morbid illnesses, this patient is at least at moderate risk for complications without adequate follow up.  This format is felt to be most appropriate for this patient at this time.  All issues noted in this document were discussed and addressed.  A limited physical exam was performed with this format.  Please refer to the patient's chart for his consent to telehealth for [facility].       Date:  05/28/2020   ID:  Tom Phelps, DOB Jan 09, 1960, MRN [id number] The patient was identified using 2 identifiers.  Patient Location: Home Provider Location: Home Office   PCP:  Mila Palmer, MD   [facility] Providers Cardiologist:  Lance Muss, MD     Evaluation Performed:  Follow-Up Visit  Chief Complaint:  DOE  History of Present Illness:    Tom Phelps is a 60 y.o. male with Tom Phelps is a 60 y.o. male   with history of hypertension, DM, HLD, family history of early CAD, TIA on Plavix.[place] on6/2020 complaining of exertional chest pain shortness of breath coronary CT which showed FFR analysis severe stenosis in the mid LAD.He underwent cardiac catheterization 07/20/2018 and was found to have severe diffuse two-vessel disease and was referred for CABG.  Patient underwent CABG times 2-7/23/20 with a LIMA to the LAD and RSVG to OM 2.He had a pleural effusion that was drained. F/u CXR showed no effusion.  Acute superficial venous thrombosis of left lower extremity.  THis resolved.  He had cholecystectomy in 2/22  He reported shortness of breath with exertion in 3/22.  Negative d-Dimer and BNP in 3/22.    Sx persisted so echo was done in 4/22: "Left ventricular ejection fraction, by estimation, is 60  to 65%. The  left ventricle has normal function. The left ventricle has no regional  wall motion abnormalities. Left ventricular diastolic parameters are  indeterminate.  2. Right ventricular systolic function is normal. The right ventricular  size is normal. There is normal pulmonary artery systolic pressure. The  estimated right ventricular systolic pressure is 28.4 mmHg.  3. The mitral valve is normal in structure. Trivial mitral valve  regurgitation. No evidence of mitral stenosis.  4. The aortic valve is tricuspid. Aortic valve regurgitation is not  visualized. Mild aortic valve sclerosis is present, with no evidence of  aortic valve stenosis.  5. There is borderline dilatation of the aortic root, measuring 37 mm.  There is borderline dilatation of the ascending aorta, measuring 37 mm.  6. The inferior vena cava is normal in size with greater than 50%  respiratory variability, suggesting right atrial pressure of 3 mmHg.   Comparison(s): 06/21/18 EF 50-55%."  Since the last visit, he continues to have the DOE.  He does not have the chest pain like he did prior to CABG.  He does feel a few seconds of lower chest discomfort when he leans down to pick up his dog, stands up.  It lasts up to 1-5 seconds.   No sustained sx.  He has been working with [employer] of late. He is back to delivering packages.  He has had to walk up stairs to deliver a package a few days ago.   The patient does not have symptoms concerning for  COVID-19 infection (fever, chills, cough, or new shortness of breath).    Past Medical History:  Diagnosis Date  . Actinic keratoses   . Ankle dislocation    calcaneal tendons  . Anxiety   . Aortic atherosclerosis (HCC)   . Chronic pain of left knee 11/18/2017  . Coronary artery disease   . DDD (degenerative disc disease), cervical   . Diabetes mellitus without complication (HCC) 2019   no meds  . Displaced other extraarticular fracture of right calcaneus, initial  encounter for closed fracture 07/13/2017  . DOE (dyspnea on exertion)   . Dyspnea   . Fatty liver    mild  . Fracture of distal end of right fibula   . Gallstones   . History of kidney stones   . Hypercholesteremia   . Hypercholesterolemia   . Hypertension   . Lightheadedness   . Neck pain    due to work   . Neuromuscular disorder (HCC)    carpal tunnel bi lat  . Prediabetes   . Pulmonary nodules   . Stress reaction   . Stroke (HCC)    2018 no residuals  . TIA (transient ischemic attack)    circulation    Past Surgical History:  Procedure Laterality Date  . BACK SURGERY  2005   L4-L5 fusion  . CHOLECYSTECTOMY N/A 03/02/2020   Procedure: LAPAROSCOPIC CHOLECYSTECTOMY;  Surgeon: Andria Meuse, MD;  Location: [facility];  Service: General;  Laterality: N/A;  . CORONARY ARTERY BYPASS GRAFT N/A 07/29/2018   Procedure: CORONARY ARTERY BYPASS GRAFTING (CABG) x 2;  Surgeon: Corliss Skains, MD;  Location: [facility] OR;  Service: Open Heart Surgery;  Laterality: N/A;  . ENDOVEIN HARVEST OF GREATER SAPHENOUS VEIN Left 07/29/2018   Procedure: Endovein Harvest Of Greater Saphenous Vein Left Thigh; Exploration only of right leg greater saphenous vein;  Surgeon: Corliss Skains, MD;  Location: [facility] OR;  Service: Open Heart Surgery;  Laterality: Left;  . INTRAVASCULAR PRESSURE WIRE/FFR STUDY N/A 07/20/2018   Procedure: INTRAVASCULAR PRESSURE WIRE/FFR STUDY;  Surgeon: Corky Crafts, MD;  Location: [facility] INVASIVE CV LAB;  Service: Cardiovascular;  Laterality: N/A;  . LEFT HEART CATH AND CORONARY ANGIOGRAPHY N/A 07/20/2018   Procedure: LEFT HEART CATH AND CORONARY ANGIOGRAPHY;  Surgeon: Corky Crafts, MD;  Location: [facility] INVASIVE CV LAB;  Service: Cardiovascular;  Laterality: N/A;  . TEE WITHOUT CARDIOVERSION N/A 07/29/2018   Procedure: TRANSESOPHAGEAL ECHOCARDIOGRAM (TEE);  Surgeon: Corliss Skains, MD;  Location: [facility] OR;  Service: Open Heart Surgery;  Laterality: N/A;     Current  Meds  Medication Sig  . aspirin EC 81 MG tablet Take 81 mg by mouth daily.  Marland Kitchen atorvastatin (LIPITOR) 80 MG tablet Take 1 tablet by mouth once daily  . clopidogrel (PLAVIX) 75 MG tablet Take 75 mg by mouth daily.  . diclofenac (VOLTAREN) 75 MG EC tablet Take 75 mg by mouth 2 (two) times daily as needed.  Marland Kitchen ibuprofen (ADVIL) 800 MG tablet Take 800 mg by mouth 3 (three) times daily as needed for mild pain or moderate pain.   . metoprolol tartrate (LOPRESSOR) 25 MG tablet Take 1 tablet (25 mg total) by mouth 2 (two) times daily.     Allergies:   Metformin and related, Metformin hcl, Oxycodone, and Oxycodone hcl   Social History   Tobacco Use  . Smoking status: Never Smoker  . Smokeless tobacco: Never Used  Vaping Use  . Vaping Use: Never used  Substance Use Topics  .  Alcohol use: Never  . Drug use: Never     Family Hx: [name] family history includes CAD in his father and mother.  ROS:   Please see the history of present illness.    Feels that belly size is increasing, but weight is stable All other systems reviewed and are negative.   Prior CV studies:   The following studies were reviewed today:  Additional studies/ records that were reviewed today with results demonstrating: echo reviewed.  Labs/Other Tests and Data Reviewed:    EKG:  An ECG dated 09/2019 was personally reviewed today and demonstrated:  NSR, nonspecific ST changes  Recent Labs: 02/21/2020: ALT 15 04/03/2020: BUN 14; Creatinine, Ser 1.10; NT-Pro BNP 159; Potassium 4.2; Sodium 142 05/14/2020: Hemoglobin 13.4; Platelets 252; TSH 2.000   Recent Lipid Panel No results found for: CHOL, TRIG, HDL, CHOLHDL, LDLCALC, LDLDIRECT  Wt Readings from Last 3 Encounters:  05/28/20 190 lb (86.2 kg)  04/10/20 200 lb (90.7 kg)  03/02/20 192 lb (87.1 kg)     Risk Assessment/Calculations:      Objective:    Vital Signs:  BP 125/77   Pulse (!) 59   Ht 5\' 10"  (1.778 m)   Wt 190 lb (86.2 kg)   BMI 27.26 kg/m     VITAL SIGNS:  reviewed GEN:  no acute distress RESPIRATORY:  normal respiratory effort, symmetric expansion NEURO:  alert and oriented x 3, no obvious focal deficit PSYCH:  normal affect exam limited by video format  ASSESSMENT & PLAN:    1. CAD/DOE: s/p CABG.  No cardiac source of DOE identified.  COntinue aggressive secondary prevention.  Sx worse with bending down.  They resolve quickly.  No further testing.   2. Hyperlipidemia: Whole food, plant based diet. 3. TIA: continue clopidogrel.  No bleeding problems.   4. Borderline blood sugar: Prediabetes.   A1C 6.7 before cholecystectomy.  A1C increased after surgery, now on insulin.        COVID-19 Education: The signs and symptoms of COVID-19 were discussed with the patient and how to seek care for testing (follow up with PCP or arrange E-visit).  The importance of social distancing was discussed today.  Time:   Today, I have spent 26 minutes with the patient with telehealth technology discussing the above problems.   Recommend 150 minutes/week of aerobic exercise Low fat, low carb, high fiber diet recommended    Medication Adjustments/Labs and Tests Ordered: Current medicines are reviewed at length with the patient today.  Concerns regarding medicines are outlined above.   Tests Ordered: No orders of the defined types were placed in this encounter.   Medication Changes: No orders of the defined types were placed in this encounter.   Follow Up:  In Person in 4 month(s)  Signed, , MD  05/28/2020 9:47 AM    [facility]

## 2020-05-28 ENCOUNTER — Telehealth (INDEPENDENT_AMBULATORY_CARE_PROVIDER_SITE_OTHER): Payer: Medicare Other | Admitting: Interventional Cardiology

## 2020-05-28 ENCOUNTER — Other Ambulatory Visit: Payer: Self-pay

## 2020-05-28 ENCOUNTER — Encounter: Payer: Self-pay | Admitting: Interventional Cardiology

## 2020-05-28 VITALS — BP 125/77 | HR 59 | Ht 70.0 in | Wt 190.0 lb

## 2020-05-28 DIAGNOSIS — Z8673 Personal history of transient ischemic attack (TIA), and cerebral infarction without residual deficits: Secondary | ICD-10-CM

## 2020-05-28 DIAGNOSIS — I25709 Atherosclerosis of coronary artery bypass graft(s), unspecified, with unspecified angina pectoris: Secondary | ICD-10-CM

## 2020-05-28 DIAGNOSIS — E782 Mixed hyperlipidemia: Secondary | ICD-10-CM | POA: Diagnosis not present

## 2020-05-28 DIAGNOSIS — Z794 Long term (current) use of insulin: Secondary | ICD-10-CM

## 2020-05-28 DIAGNOSIS — Z951 Presence of aortocoronary bypass graft: Secondary | ICD-10-CM | POA: Diagnosis not present

## 2020-05-28 DIAGNOSIS — E1159 Type 2 diabetes mellitus with other circulatory complications: Secondary | ICD-10-CM | POA: Diagnosis not present

## 2020-05-28 DIAGNOSIS — R7303 Prediabetes: Secondary | ICD-10-CM

## 2020-05-28 NOTE — Patient Instructions (Signed)
Medication Instructions:  Your physician recommends that you continue on your current medications as directed. Please refer to the Current Medication list given to you today.  *If you need a refill on your cardiac medications before your next appointment, please call your pharmacy*   Lab Work: none If you have labs (blood work) drawn today and your tests are completely normal, you will receive your results only by: Marland Kitchen MyChart Message (if you have MyChart) OR . A paper copy in the mail If you have any lab test that is abnormal or we need to change your treatment, we will call you to review the results.   Testing/Procedures: none   Follow-Up: At [facility], you and your health needs are our priority.  As part of our continuing mission to provide you with exceptional heart care, we have created designated Provider Care Teams.  These Care Teams include your primary Cardiologist (physician) and Advanced Practice Providers (APPs -  Physician Assistants and Nurse Practitioners) who all work together to provide you with the care you need, when you need it.  We recommend signing up for the patient portal called "MyChart".  Sign up information is provided on this After Visit Summary.  MyChart is used to connect with patients for Virtual Visits (Telemedicine).  Patients are able to view lab/test results, encounter notes, upcoming appointments, etc.  Non-urgent messages can be sent to your provider as well.   To learn more about what you can do with MyChart, go to [email or web address].    Your next appointment:   4 month(s)  The format for your next appointment:   In Person  Provider:   You may see Lance Muss, MD or one of the following Advanced Practice Providers on your designated Care Team:    Ronie Spies, PA-C  Jacolyn Reedy, PA-C    Other Instructions  High-Fiber Eating Plan Fiber, also called dietary fiber, is a type of carbohydrate. It is found foods such as fruits,  vegetables, whole grains, and beans. A high-fiber diet can have many health benefits. Your health care provider may recommend a high-fiber diet to help:  Prevent constipation. Fiber can make your bowel movements more regular.  Lower your cholesterol.  Relieve the following conditions: ? Inflammation of veins in the anus (hemorrhoids). ? Inflammation of specific areas of the digestive tract (uncomplicated diverticulosis). ? A problem of the large intestine, also called the colon, that sometimes causes pain and diarrhea (irritable bowel syndrome, or IBS).  Prevent overeating as part of a weight-loss plan.  Prevent heart disease, type 2 diabetes, and certain cancers. What are tips for following this plan? Reading food labels  Check the nutrition facts label on food products for the amount of dietary fiber. Choose foods that have 5 grams of fiber or more per serving.  The goals for recommended daily fiber intake include: ? Men (age 72 or younger): 34-38 g. ? Men (over age 59): 28-34 g. ? Women (age 76 or younger): 25-28 g. ? Women (over age 69): 22-25 g. Your daily fiber goal is _____________ g.   Shopping  Choose whole fruits and vegetables instead of processed forms, such as apple juice or applesauce.  Choose a wide variety of high-fiber foods such as avocados, lentils, oats, and kidney beans.  Read the nutrition facts label of the foods you choose. Be aware of foods with added fiber. These foods often have high sugar and sodium amounts per serving. Cooking  Use whole-grain flour for baking and cooking.  Cook with brown rice instead of white rice. Meal planning  Start the day with a breakfast that is high in fiber, such as a cereal that contains 5 g of fiber or more per serving.  Eat breads and cereals that are made with whole-grain flour instead of refined flour or white flour.  Eat brown rice, bulgur wheat, or millet instead of white rice.  Use beans in place of meat in  soups, salads, and pasta dishes.  Be sure that half of the grains you eat each day are whole grains. General information  You can get the recommended daily intake of dietary fiber by: ? Eating a variety of fruits, vegetables, grains, nuts, and beans. ? Taking a fiber supplement if you are not able to take in enough fiber in your diet. It is better to get fiber through food than from a supplement.  Gradually increase how much fiber you consume. If you increase your intake of dietary fiber too quickly, you may have bloating, cramping, or gas.  Drink plenty of water to help you digest fiber.  Choose high-fiber snacks, such as berries, raw vegetables, nuts, and popcorn. What foods should I eat? Fruits Berries. Pears. Apples. Oranges. Avocado. Prunes and raisins. Dried figs. Vegetables Sweet potatoes. Spinach. Kale. Artichokes. Cabbage. Broccoli. Cauliflower. Green peas. Carrots. Squash. Grains Whole-grain breads. Multigrain cereal. Oats and oatmeal. Brown rice. Barley. Bulgur wheat. Millet. Quinoa. Bran muffins. Popcorn. Rye wafer crackers. Meats and other proteins Navy beans, kidney beans, and pinto beans. Soybeans. Split peas. Lentils. Nuts and seeds. Dairy Fiber-fortified yogurt. Beverages Fiber-fortified soy milk. Fiber-fortified orange juice. Other foods Fiber bars. The items listed above may not be a complete list of recommended foods and beverages. Contact a dietitian for more information. What foods should I avoid? Fruits Fruit juice. Cooked, strained fruit. Vegetables Fried potatoes. Canned vegetables. Well-cooked vegetables. Grains White bread. Pasta made with refined flour. White rice. Meats and other proteins Fatty cuts of meat. Fried chicken or fried fish. Dairy Milk. Yogurt. Cream cheese. Sour cream. Fats and oils Butters. Beverages Soft drinks. Other foods Cakes and pastries. The items listed above may not be a complete list of foods and beverages to avoid.  Talk with your dietitian about what choices are best for you. Summary  Fiber is a type of carbohydrate. It is found in foods such as fruits, vegetables, whole grains, and beans.  A high-fiber diet has many benefits. It can help to prevent constipation, lower blood cholesterol, aid weight loss, and reduce your risk of heart disease, diabetes, and certain cancers.  Increase your intake of fiber gradually. Increasing fiber too quickly may cause cramping, bloating, and gas. Drink plenty of water while you increase the amount of fiber you consume.  The best sources of fiber include whole fruits and vegetables, whole grains, nuts, seeds, and beans. This information is not intended to replace advice given to you by your health care provider. Make sure you discuss any questions you have with your health care provider. Document Revised: 04/28/2019 Document Reviewed: 04/28/2019 Elsevier Patient Education  2021 Elsevier Inc.   

## 2020-05-29 DIAGNOSIS — Z794 Long term (current) use of insulin: Secondary | ICD-10-CM | POA: Diagnosis not present

## 2020-05-29 DIAGNOSIS — E1169 Type 2 diabetes mellitus with other specified complication: Secondary | ICD-10-CM | POA: Diagnosis not present

## 2020-06-25 ENCOUNTER — Other Ambulatory Visit: Payer: Self-pay | Admitting: *Deleted

## 2020-06-25 NOTE — Patient Outreach (Signed)
[facility]) Care Management  06/25/2020  Tom Phelps [birth year] [id number]   Referral Date: 6/16 Referral Source: Insurance Referral Reason: Chronic Care Management Insurance: [employer]   Outreach attempt #1, successful.  Identity verified.  This care manager introduced self and stated purpose of call.  [facility] care management services explained.  He state he is doing well and denies needs/involvement with [facility].  Benefits of [facility] explained, he again declines.  Offered to send information about program and follow up, he agrees.   Plan: RN CM will send successful outreach letter with this care manager's contact information.  Will follow up within the next 3 weeks in effort to engage member and start assessment/initiate care plan.  [name], [place], MSN [facility] Care Management  [facility] Manager [phone]

## 2020-07-17 ENCOUNTER — Other Ambulatory Visit: Payer: Self-pay | Admitting: *Deleted

## 2020-07-17 NOTE — Patient Outreach (Signed)
[facility]) Care Management  07/17/2020  Tom Phelps 09/16/1960 [id number]   Outgoing call placed to member to follow up on decision regarding [facility] engagement, no answer, HIPAA compliant voice message left.  Will follow up within the next 3-4 business days.  [name], [place], MSN [facility] Care Management  [facility] Manager [phone]

## 2020-07-20 ENCOUNTER — Other Ambulatory Visit: Payer: Self-pay | Admitting: *Deleted

## 2020-07-20 NOTE — Patient Outreach (Signed)
[facility]) Care Management  07/20/2020  Tom Phelps [birth year] [id number]   Outreach attempt #2 to member to follow up on decision regarding [facility] engagement, no answer, HIPAA compliant voice message left.  Will send outreach letter and follow up within the next 3-4 business days  [name], Charity fundraiser, MSN [facility] Care Management  [facility] Manager [phone]

## 2020-07-25 ENCOUNTER — Other Ambulatory Visit: Payer: Self-pay | Admitting: *Deleted

## 2020-07-25 DIAGNOSIS — M170 Bilateral primary osteoarthritis of knee: Secondary | ICD-10-CM | POA: Diagnosis not present

## 2020-07-25 NOTE — Patient Outreach (Signed)
[facility]) Care Management  07/25/2020  Tom Phelps 09-23-1960 [id number]   Outreach attempt #3 to member to follow up on decision regarding [facility] engagement, no answer, HIPAA compliant voice message left.  Will make 4th and final attempt within the next 4 weeks.  If remain unsuccessful, will close case due to inability to maintain contact.  [name], [place], MSN [facility] Care Management  [facility] Manager [phone]

## 2020-08-10 ENCOUNTER — Other Ambulatory Visit: Payer: Self-pay | Admitting: Interventional Cardiology

## 2020-08-20 DIAGNOSIS — I2510 Atherosclerotic heart disease of native coronary artery without angina pectoris: Secondary | ICD-10-CM | POA: Diagnosis not present

## 2020-08-20 DIAGNOSIS — M5030 Other cervical disc degeneration, unspecified cervical region: Secondary | ICD-10-CM | POA: Diagnosis not present

## 2020-08-20 DIAGNOSIS — E1169 Type 2 diabetes mellitus with other specified complication: Secondary | ICD-10-CM | POA: Diagnosis not present

## 2020-08-20 DIAGNOSIS — I700 Atherosclerosis of aorta: Secondary | ICD-10-CM | POA: Diagnosis not present

## 2020-08-20 DIAGNOSIS — Z0000 Encounter for general adult medical examination without abnormal findings: Secondary | ICD-10-CM | POA: Diagnosis not present

## 2020-08-20 DIAGNOSIS — I10 Essential (primary) hypertension: Secondary | ICD-10-CM | POA: Diagnosis not present

## 2020-08-20 DIAGNOSIS — K760 Fatty (change of) liver, not elsewhere classified: Secondary | ICD-10-CM | POA: Diagnosis not present

## 2020-08-20 DIAGNOSIS — M9981 Other biomechanical lesions of cervical region: Secondary | ICD-10-CM | POA: Diagnosis not present

## 2020-08-20 DIAGNOSIS — Z79899 Other long term (current) drug therapy: Secondary | ICD-10-CM | POA: Diagnosis not present

## 2020-08-21 ENCOUNTER — Telehealth: Payer: Self-pay | Admitting: Interventional Cardiology

## 2020-08-21 ENCOUNTER — Other Ambulatory Visit: Payer: Self-pay | Admitting: *Deleted

## 2020-08-21 DIAGNOSIS — E7800 Pure hypercholesterolemia, unspecified: Secondary | ICD-10-CM | POA: Diagnosis not present

## 2020-08-21 DIAGNOSIS — E1169 Type 2 diabetes mellitus with other specified complication: Secondary | ICD-10-CM | POA: Diagnosis not present

## 2020-08-21 DIAGNOSIS — Z1159 Encounter for screening for other viral diseases: Secondary | ICD-10-CM | POA: Diagnosis not present

## 2020-08-21 DIAGNOSIS — Z79899 Other long term (current) drug therapy: Secondary | ICD-10-CM | POA: Diagnosis not present

## 2020-08-21 DIAGNOSIS — R946 Abnormal results of thyroid function studies: Secondary | ICD-10-CM | POA: Diagnosis not present

## 2020-08-21 DIAGNOSIS — Z794 Long term (current) use of insulin: Secondary | ICD-10-CM | POA: Diagnosis not present

## 2020-08-21 NOTE — Patient Outreach (Signed)
[facility]) Care Management  08/21/2020  Tom Phelps [birth year] [id number]   Outreach attempt #4 to member to follow up on decision regarding [facility] engagement, no answer, HIPAA compliant voice message left.No response from member after multiple unsuccessful outreach attempts and letter sent.  Will close case at this time due to inability to maintain contact.  Will notify member and primary MD of case closure.  [name], [place], MSN [facility] Care Management  [facility] Manager [phone]

## 2020-08-21 NOTE — Telephone Encounter (Signed)
I placed call to patient and left message to call office 

## 2020-08-21 NOTE — Telephone Encounter (Signed)
Patient returning call.

## 2020-08-21 NOTE — Telephone Encounter (Signed)
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     1. Are you currently SOB ?   2. How long have you been experiencing SOB?   3. Are you SOB when sitting or when up moving around?   4. Are you currently experiencing any other symptoms?   Appointment Request From: Claudius Sis    Reason for visit: Office Visit   Comments: Same kind of heavy breathing when try to get up the floor

## 2020-08-21 NOTE — Telephone Encounter (Signed)
I spoke with patient. He reports he continues to have shortness of breath.  It is the same as when he last had visit with Dr Eldridge Dace.  Has not worsened.  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 Lasts a few seconds and goes away.  Will sometimes have chest heaviness with this.  He reports this has been going on for awhile also.  Heaviness is in the center of his chest and usually occurs when standing up and bending over to tie his shoes. Lasts a few seconds and goes away when he catches his breath.  Per last office note cardiac source has not been identified for shortness of breath.  Patient saw PCP yesterday but did not discuss with her.  Has never seen pulmonary. Will forward to Dr Eldridge Dace for review/recommendations.

## 2020-08-22 NOTE — Telephone Encounter (Signed)
Cardiac testing has all been reassuring.  I don't think he needs a cath based on his test results.   How does he feel when walking longer distances.  WOuld try to gradually increase exercise to improve stamina, target 30 minutes, 5x/week.  SHOB with bending down is more likely from pushing on his diaphragm and harder to get a breath.

## 2020-08-23 NOTE — Telephone Encounter (Signed)
Sent JV response via mychart.  Await further needs.

## 2020-08-27 DIAGNOSIS — R946 Abnormal results of thyroid function studies: Secondary | ICD-10-CM | POA: Diagnosis not present

## 2020-08-28 DIAGNOSIS — E0590 Thyrotoxicosis, unspecified without thyrotoxic crisis or storm: Secondary | ICD-10-CM | POA: Diagnosis not present

## 2020-08-28 DIAGNOSIS — E1165 Type 2 diabetes mellitus with hyperglycemia: Secondary | ICD-10-CM | POA: Diagnosis not present

## 2020-08-28 DIAGNOSIS — I2510 Atherosclerotic heart disease of native coronary artery without angina pectoris: Secondary | ICD-10-CM | POA: Diagnosis not present

## 2020-09-04 ENCOUNTER — Other Ambulatory Visit (HOSPITAL_COMMUNITY): Payer: Self-pay | Admitting: Endocrinology

## 2020-09-04 ENCOUNTER — Other Ambulatory Visit: Payer: Self-pay | Admitting: Endocrinology

## 2020-09-04 DIAGNOSIS — E0590 Thyrotoxicosis, unspecified without thyrotoxic crisis or storm: Secondary | ICD-10-CM

## 2020-09-17 ENCOUNTER — Encounter (HOSPITAL_COMMUNITY)
Admission: RE | Admit: 2020-09-17 | Discharge: 2020-09-17 | Disposition: A | Discharge status: Home / Self Care | Payer: Medicare Other | Source: Ambulatory Visit | Attending: Endocrinology | Admitting: Endocrinology

## 2020-09-17 ENCOUNTER — Other Ambulatory Visit: Payer: Self-pay

## 2020-09-17 DIAGNOSIS — E0590 Thyrotoxicosis, unspecified without thyrotoxic crisis or storm: Secondary | ICD-10-CM | POA: Insufficient documentation

## 2020-09-17 MED ORDER — SODIUM IODIDE I-123 7.4 MBQ CAPS
416.0000 | ORAL_CAPSULE | Freq: Once | ORAL | Status: AC
  Administered 2020-09-17: 416 via ORAL

## 2020-09-18 ENCOUNTER — Encounter (HOSPITAL_COMMUNITY)
Admission: RE | Admit: 2020-09-18 | Discharge: 2020-09-18 | Disposition: A | Discharge status: Home / Self Care | Payer: Medicare Other | Source: Ambulatory Visit | Attending: Endocrinology | Admitting: Endocrinology

## 2020-09-18 DIAGNOSIS — E0590 Thyrotoxicosis, unspecified without thyrotoxic crisis or storm: Secondary | ICD-10-CM | POA: Diagnosis not present

## 2020-09-20 DIAGNOSIS — M1711 Unilateral primary osteoarthritis, right knee: Secondary | ICD-10-CM | POA: Diagnosis not present

## 2020-09-27 DIAGNOSIS — M1711 Unilateral primary osteoarthritis, right knee: Secondary | ICD-10-CM | POA: Diagnosis not present

## 2020-10-04 DIAGNOSIS — M1711 Unilateral primary osteoarthritis, right knee: Secondary | ICD-10-CM | POA: Diagnosis not present

## 2020-10-13 IMAGING — CT CT HEAR MORPH WITH CTA COR WITH SCORE WITH CA WITH CONTRAST AND
4 of 7 series · 8 of 20 positions shown, 9 images · IV contrast (APPLIED)
Comparison: Chest CT 04/10/2017
COMPARISON: Chest CT 04/10/2017
COMPARISON: Chest CT 04/10/2017

Addendum:
EXAM:
OVER-READ INTERPRETATION  CT CHEST

The following report is an over-read performed by radiologist Dr.
Brennan Aujla [REDACTED] on 07/06/2018. This over-read
does not include interpretation of cardiac or coronary anatomy or
pathology. The coronary CTA interpretation by the cardiologist is
attached.
HISTORY: chest pain
Cardiac/Coronary  CT
TECHNIQUE: The patient was scanned on a Siemens Force scanner.
PROTOCOL: A 120 kV prospective scan was triggered in the descending thoracic
aorta at 111 HU's. Axial non-contrast 3 mm slices were carried out
through the heart. The data set was analyzed on a dedicated work
station and scored using the Agatson method. Gantry rotation speed
was 250 msecs and collimation was .6 mm. Beta blockade and 0.8 mg of
sl NTG was given. The 3D data set was reconstructed in 5% intervals
of the 67-82 % of the R-R cycle. Diastolic phases were analyzed on a
dedicated work station using MPR, MIP and VRT modes. The patient
received 100mL OMNIPAQUE IOHEXOL 350 MG/ML SOLN of contrast.

[Series 6: best diast 74 % · axial · 0.37mm/px · z∈[+1078,+1125]mm · 2 of 348 slices shown, 3 images]
[im 116/348  vessel]
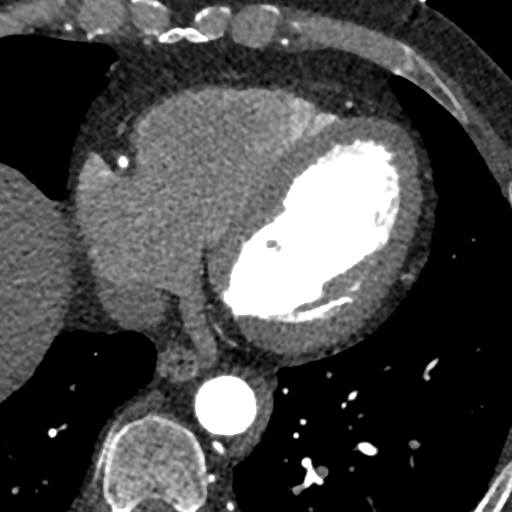
[im 116/348  lung]
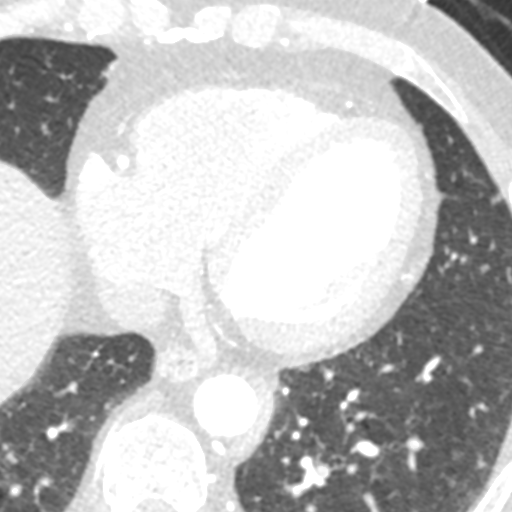
[im 232/348  vessel]
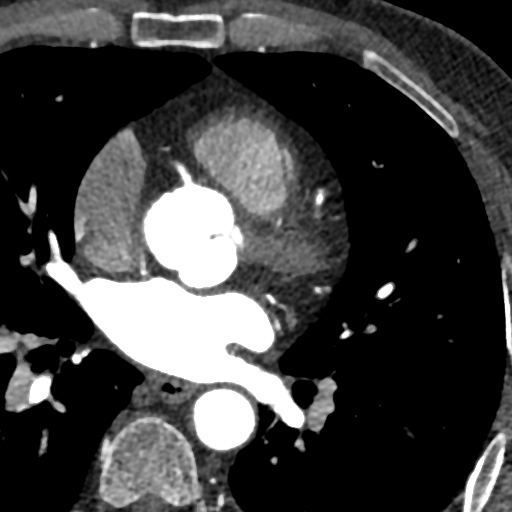

[Series 7: best syst 38 % · axial · 0.37mm/px · z∈[+1078,+1125]mm · 2 of 348 slices shown]
[im 116/348  vessel]
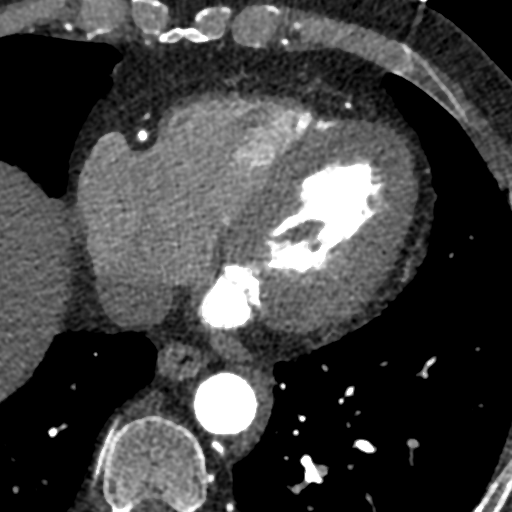
[im 232/348  vessel]
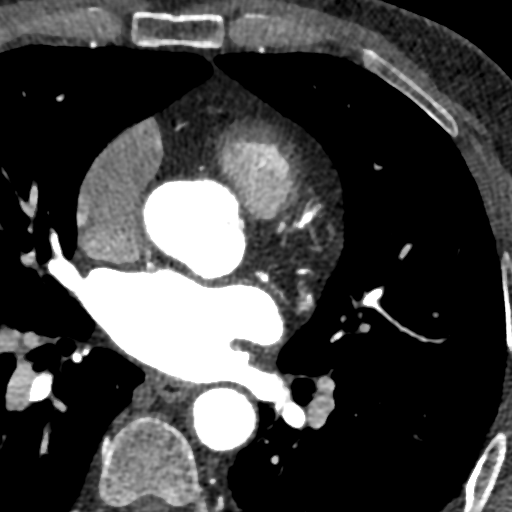

[Series 8: ts diast sharp 74 % · axial · 0.37mm/px · z∈[+1078,+1125]mm · 2 of 348 slices shown]
[im 116/348  lung]
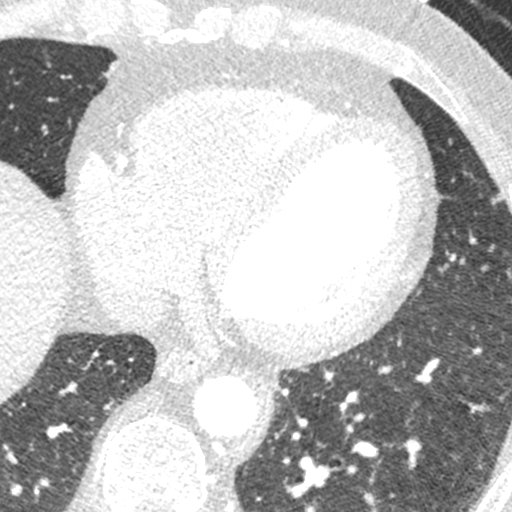
[im 232/348  lung]
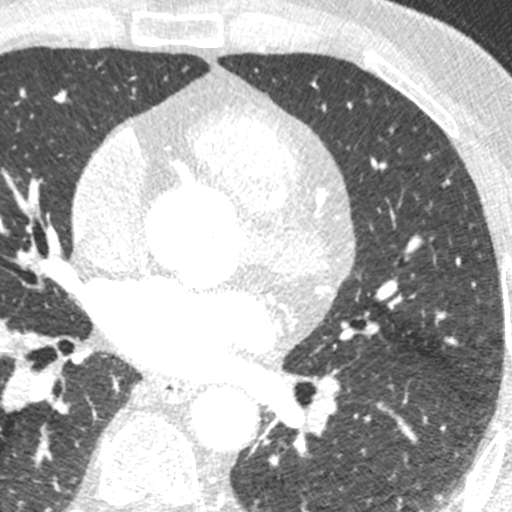

[Series 9: ts syst sharp 38 % · axial · 0.37mm/px · z∈[+1078,+1125]mm · 2 of 348 slices shown]
[im 116/348  lung]
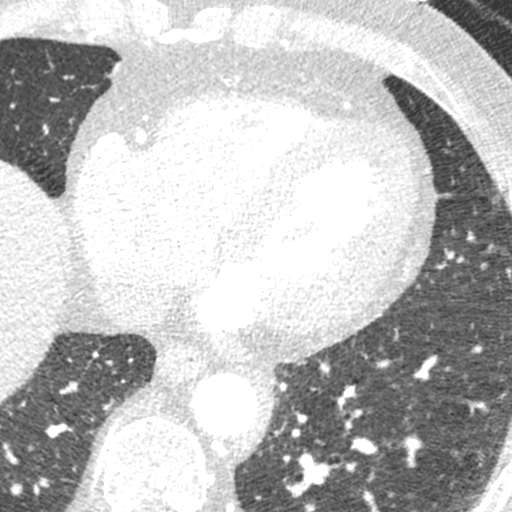
[im 232/348  lung]
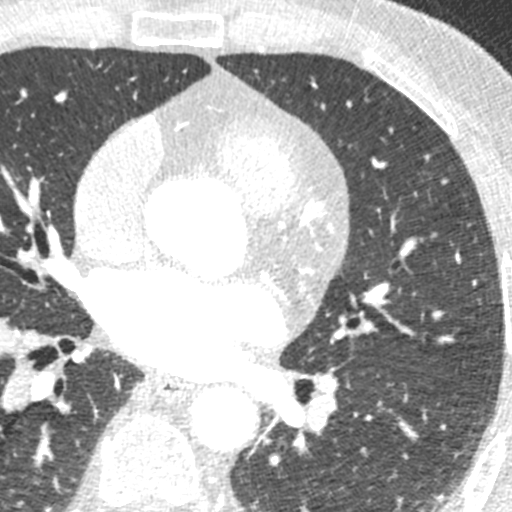

[8 of 20 positions shown; findings below may reference images not displayed]

FINDINGS: Vascular: Aortic atherosclerosis. No central pulmonary embolism, on
this non-dedicated study.

Mediastinum/Nodes: No imaged thoracic adenopathy.

Lungs/Pleura: No pleural fluid. Subpleural predominant pulmonary
nodules on the order of 4 mm and less are similar, including on
image 40/12.

Upper Abdomen: Normal imaged portions of the liver, spleen, stomach.

Musculoskeletal: No acute osseous abnormality.
IMPRESSION: 1.  No acute findings in the imaged extracardiac chest.
2. Scattered subpleural pulmonary nodules are most likely subpleural
lymph nodes. These were evaluated on 04/10/2017. Please see
recommendations included there.
FINDINGS: Coronary calcium score: [name] coronary artery calcium score
is 937, which places the patient in the 96th percentile.

Coronary arteries: Normal coronary origins.  Right dominance.

Right Coronary Artery: Severe proximal RCA mixed atherosclerotic
plaque, 70-99% stenosis. Severe mid RCA with atherosclerotic plaque
70-99% stenosis, with features of positive remodeling (vulnerable
plaque characteristic). Moderate distal RCA plaque, 50-69% stenosis.
Patent PDA and PL, small caliber, with some proximal disease in PL.

Left Main Coronary Artery: Moderate distal left main stenosis,
50-69% stenosis with mixed atherosclerotic plaque and near
circumferential calcification.

Left Anterior Descending Coronary Artery: Severe mixed
atherosclerotic plaque in proximal LAD, 70-99% stenosis. Severe
mixed atherosclerotic plaque in the mid LAD, 70-99% stenosis. Severe
mixed atherosclerotic plaque in the distal LAD, 70-99% stenosis.
Patent small diagonals.

Left Circumflex Artery: Mild mixed atherosclerotic plaque in the
proximal circumflex. Likely severe mixed atherosclerotic plaque
diffusely in the distal circumflex artery, which is a small caliber
vessel. Patent small OM.

Aorta: Normal size, 34 mm at the mid ascending aorta (level of the
PA bifurcation) measured double oblique. No calcifications. No
dissection.

Aortic Valve: No calcifications.

Other findings:

Normal pulmonary vein drainage into the left atrium.

Normal left atrial appendage without a thrombus.

Normal size of the pulmonary artery.
IMPRESSION: 1. Severe CAD, CADRADS = 4. Severe lesions in the LAD, and likely
>50% lesions in distal left main coronary artery. Severe proximal
RCA lesion. Consider coronary angiography. CT FFR will be performed
and reported separately.

2. [name] coronary artery calcium score is 937, which places
the patient in the 96th percentile for age and sex matched control.

3. Normal coronary origin with right dominance.

ADDENDUM:
Ramus intermedius branch is present, with severe proximal mixed
atherosclerotic plaque, 70-99% stenosis. Mild scattered disease in
remainder of vessel.

*** End of Addendum ***
Addendum:
EXAM:
OVER-READ INTERPRETATION  CT CHEST

The following report is an over-read performed by radiologist Dr.
Brennan Aujla [REDACTED] on 07/06/2018. This over-read
does not include interpretation of cardiac or coronary anatomy or
pathology. The coronary CTA interpretation by the cardiologist is
attached.
FINDINGS: Vascular: Aortic atherosclerosis. No central pulmonary embolism, on
this non-dedicated study.

Mediastinum/Nodes: No imaged thoracic adenopathy.

Lungs/Pleura: No pleural fluid. Subpleural predominant pulmonary
nodules on the order of 4 mm and less are similar, including on
image 40/12.

Upper Abdomen: Normal imaged portions of the liver, spleen, stomach.

Musculoskeletal: No acute osseous abnormality.
IMPRESSION: 1.  No acute findings in the imaged extracardiac chest.
2. Scattered subpleural pulmonary nodules are most likely subpleural
lymph nodes. These were evaluated on 04/10/2017. Please see
recommendations included there.
FINDINGS: Coronary calcium score: [name] coronary artery calcium score
is 937, which places the patient in the 96th percentile.

Coronary arteries: Normal coronary origins.  Right dominance.

Right Coronary Artery: Severe proximal RCA mixed atherosclerotic
plaque, 70-99% stenosis. Severe mid RCA with atherosclerotic plaque
70-99% stenosis, with features of positive remodeling (vulnerable
plaque characteristic). Moderate distal RCA plaque, 50-69% stenosis.
Patent PDA and PL, small caliber, with some proximal disease in PL.

Left Main Coronary Artery: Moderate distal left main stenosis,
50-69% stenosis with mixed atherosclerotic plaque and near
circumferential calcification.

Left Anterior Descending Coronary Artery: Severe mixed
atherosclerotic plaque in proximal LAD, 70-99% stenosis. Severe
mixed atherosclerotic plaque in the mid LAD, 70-99% stenosis. Severe
mixed atherosclerotic plaque in the distal LAD, 70-99% stenosis.
Patent small diagonals.

Left Circumflex Artery: Mild mixed atherosclerotic plaque in the
proximal circumflex. Likely severe mixed atherosclerotic plaque
diffusely in the distal circumflex artery, which is a small caliber
vessel. Patent small OM.

Aorta: Normal size, 34 mm at the mid ascending aorta (level of the
PA bifurcation) measured double oblique. No calcifications. No
dissection.

Aortic Valve: No calcifications.

Other findings:

Normal pulmonary vein drainage into the left atrium.

Normal left atrial appendage without a thrombus.

Normal size of the pulmonary artery.
IMPRESSION: 1. Severe CAD, CADRADS = 4. Severe lesions in the LAD, and likely
>50% lesions in distal left main coronary artery. Severe proximal
RCA lesion. Consider coronary angiography. CT FFR will be performed
and reported separately.

2. [name] coronary artery calcium score is 937, which places
the patient in the 96th percentile for age and sex matched control.

3. Normal coronary origin with right dominance.

*** End of Addendum ***
EXAM:
OVER-READ INTERPRETATION  CT CHEST

The following report is an over-read performed by radiologist Dr.
Brennan Aujla [REDACTED] on 07/06/2018. This over-read
does not include interpretation of cardiac or coronary anatomy or
pathology. The coronary CTA interpretation by the cardiologist is
attached.
FINDINGS: Vascular: Aortic atherosclerosis. No central pulmonary embolism, on
this non-dedicated study.

Mediastinum/Nodes: No imaged thoracic adenopathy.

Lungs/Pleura: No pleural fluid. Subpleural predominant pulmonary
nodules on the order of 4 mm and less are similar, including on
image 40/12.

Upper Abdomen: Normal imaged portions of the liver, spleen, stomach.

Musculoskeletal: No acute osseous abnormality.
IMPRESSION: 1.  No acute findings in the imaged extracardiac chest.
2. Scattered subpleural pulmonary nodules are most likely subpleural
lymph nodes. These were evaluated on 04/10/2017. Please see
recommendations included there.

## 2020-10-19 DIAGNOSIS — Z23 Encounter for immunization: Secondary | ICD-10-CM | POA: Diagnosis not present

## 2020-10-29 DIAGNOSIS — Z1211 Encounter for screening for malignant neoplasm of colon: Secondary | ICD-10-CM | POA: Diagnosis not present

## 2020-11-05 DIAGNOSIS — E0590 Thyrotoxicosis, unspecified without thyrotoxic crisis or storm: Secondary | ICD-10-CM | POA: Diagnosis not present

## 2020-11-14 ENCOUNTER — Other Ambulatory Visit: Payer: Self-pay | Admitting: Interventional Cardiology

## 2020-11-24 DIAGNOSIS — M791 Myalgia, unspecified site: Secondary | ICD-10-CM | POA: Diagnosis not present

## 2020-11-24 DIAGNOSIS — R0981 Nasal congestion: Secondary | ICD-10-CM | POA: Diagnosis not present

## 2020-11-24 DIAGNOSIS — R519 Headache, unspecified: Secondary | ICD-10-CM | POA: Diagnosis not present

## 2020-11-24 DIAGNOSIS — R051 Acute cough: Secondary | ICD-10-CM | POA: Diagnosis not present

## 2020-11-24 DIAGNOSIS — U071 COVID-19: Secondary | ICD-10-CM | POA: Diagnosis not present

## 2020-11-24 DIAGNOSIS — R509 Fever, unspecified: Secondary | ICD-10-CM | POA: Diagnosis not present

## 2020-12-20 DIAGNOSIS — R198 Other specified symptoms and signs involving the digestive system and abdomen: Secondary | ICD-10-CM | POA: Diagnosis not present

## 2021-01-18 ENCOUNTER — Ambulatory Visit: Payer: Commercial Managed Care - HMO | Admitting: Interventional Cardiology

## 2021-01-22 NOTE — Progress Notes (Signed)
Cardiology Office Note   Date:  01/23/2021   ID:  Tom Phelps, DOB 15-Jul-1960, MRN [id number]  PCP:  Mila Palmer, MD    Chief Complaint  Patient presents with   Follow-up   CAD  Wt Readings from Last 3 Encounters:  01/23/21 183 lb (83 kg)  05/28/20 190 lb (86.2 kg)  04/10/20 200 lb (90.7 kg)       History of Present Illness: Tom Phelps is a 61 y.o. male  with history of hypertension, DM, HLD, family history of early CAD, TIA on Plavix.  I saw him on 06/2018 complaining of exertional chest pain shortness of breath coronary CT which showed FFR analysis severe stenosis in the mid LAD.  He underwent cardiac catheterization 07/20/2018 and was found to have severe diffuse two-vessel disease and was referred for CABG.   Patient underwent CABG times 2-7/23/20 with a LIMA to the LAD and RSVG to OM 2. He had a pleural effusion that was drained. F/u CXR showed no effusion.   Acute superficial venous thrombosis of left lower extremity.  THis resolved.   He had cholecystectomy in 2/22   He reported shortness of breath with exertion in 3/22.  Negative d-Dimer and BNP in 3/22.     Sx persisted so echo was done in 4/22: "Left ventricular ejection fraction, by estimation, is 60 to 65%. The  left ventricle has normal function. The left ventricle has no regional  wall motion abnormalities. Left ventricular diastolic parameters are  indeterminate.   2. Right ventricular systolic function is normal. The right ventricular  size is normal. There is normal pulmonary artery systolic pressure. The  estimated right ventricular systolic pressure is 28.4 mmHg.   3. The mitral valve is normal in structure. Trivial mitral valve  regurgitation. No evidence of mitral stenosis.   4. The aortic valve is tricuspid. Aortic valve regurgitation is not  visualized. Mild aortic valve sclerosis is present, with no evidence of  aortic valve stenosis.   5. There is borderline dilatation of the aortic root,  measuring 37 mm.  There is borderline dilatation of the ascending aorta, measuring 37 mm.   6. The inferior vena cava is normal in size with greater than 50%  respiratory variability, suggesting right atrial pressure of 3 mmHg.   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  Had some DOE as of 05/2020. Echo in 04/2020: "Normal LV function.  Normal valvular function. "  KNee pain limits activity- stopped working for [employer].  Now working at [employer] doing a less physically strenuous job.  No calf pain.   Denies : exertional Chest pain. Dizziness. Leg edema. Nitroglycerin use. Orthopnea. Palpitations. Paroxysmal nocturnal dyspnea. Shortness of breath. Syncope.    No sx like, he had prior to MI.  DOE resolved with 20 lb weight loss after starting Jardiance.      Past Medical History:  Diagnosis Date   Actinic keratoses    Ankle dislocation    calcaneal tendons   Anxiety    Aortic atherosclerosis (HCC)    Chronic pain of left knee 11/18/2017   Coronary artery disease    DDD (degenerative disc disease), cervical    Diabetes mellitus without complication (HCC) 2019   no meds   Displaced other extraarticular fracture of right calcaneus, initial encounter for closed fracture 07/13/2017   DOE (dyspnea on exertion)    Dyspnea    Fatty liver    mild   Fracture of  distal end of right fibula    Gallstones    History of kidney stones    Hypercholesteremia    Hypercholesterolemia    Hypertension    Lightheadedness    Neck pain    due to work    Neuromuscular disorder (HCC)    carpal tunnel bi lat   Prediabetes    Pulmonary nodules    Stress reaction    Stroke (HCC)    2018 no residuals   TIA (transient ischemic attack)    circulation     Past Surgical History:  Procedure Laterality Date   BACK SURGERY  2005   L4-L5 fusion   CHOLECYSTECTOMY N/A 03/02/2020   Procedure: LAPAROSCOPIC CHOLECYSTECTOMY;  Surgeon: Andria MeuseWhite, Christopher M, MD;   Location: [facility];  Service: General;  Laterality: N/A;   CORONARY ARTERY BYPASS GRAFT N/A 07/29/2018   Procedure: CORONARY ARTERY BYPASS GRAFTING (CABG) x 2;  Surgeon: Corliss SkainsLightfoot, Harrell O, MD;  Location: [facility];  Service: Open Heart Surgery;  Laterality: N/A;   ENDOVEIN HARVEST OF GREATER SAPHENOUS VEIN Left 07/29/2018   Procedure: Endovein Harvest Of Greater Saphenous Vein Left Thigh; Exploration only of right leg greater saphenous vein;  Surgeon: Corliss SkainsLightfoot, Harrell O, MD;  Location: [facility] OR;  Service: Open Heart Surgery;  Laterality: Left;   INTRAVASCULAR PRESSURE WIRE/FFR STUDY N/A 07/20/2018   Procedure: INTRAVASCULAR PRESSURE WIRE/FFR STUDY;  Surgeon: Corky CraftsVaranasi, Donnel Venuto S, MD;  [facility] CV LAB;  Service: Cardiovascular;  Laterality: N/A;   LEFT HEART CATH AND CORONARY ANGIOGRAPHY N/A 07/20/2018   Procedure: LEFT HEART CATH AND CORONARY ANGIOGRAPHY;  Surgeon: Corky CraftsVaranasi, Anhelica Fowers S, MD;  [facility];  Service: Cardiovascular;  Laterality: N/A;   TEE WITHOUT CARDIOVERSION N/A 07/29/2018   Procedure: TRANSESOPHAGEAL ECHOCARDIOGRAM (TEE);  Surgeon: Corliss SkainsLightfoot, Harrell O, MD;  [facility];  Service: Open Heart Surgery;  Laterality: N/A;     Current Outpatient Medications  Medication Sig Dispense Refill   aspirin EC 81 MG tablet Take 81 mg by mouth daily.     atorvastatin (LIPITOR) 80 MG tablet Take 1 tablet by mouth once daily 90 tablet 3   clopidogrel (PLAVIX) 75 MG tablet Take 75 mg by mouth daily.     diclofenac (VOLTAREN) 75 MG EC tablet Take 75 mg by mouth 2 (two) times daily as needed.     empagliflozin (JARDIANCE) 10 MG TABS tablet Take 1 tablet by mouth daily.     escitalopram (LEXAPRO) 10 MG tablet Take 1 tablet by mouth daily.     ibuprofen (ADVIL) 800 MG tablet Take 800 mg by mouth 3 (three) times daily as needed for mild pain or moderate pain.      metoprolol tartrate (LOPRESSOR) 25 MG tablet Take 1 tablet by mouth twice daily 180 tablet 1   simvastatin  (ZOCOR) 10 MG tablet Take 1 tablet by mouth at bedtime.     No current facility-administered medications for this visit.    Allergies:   Metformin and related, Metformin hcl, Oxycodone, and Oxycodone hcl    Social History:  The patient  reports that he has never smoked. He has never used smokeless tobacco. He reports that he does not drink alcohol and does not use drugs.   Family History:  [name] family history includes CAD in his father and mother.    ROS:  Please see the history of present illness.   Otherwise, review of systems are positive for intentional weight loss.   All other systems are reviewed  and negative.    PHYSICAL EXAM: VS:  BP 108/66    Pulse 68    Ht 5\' 10"  (1.778 m)    Wt 183 lb (83 kg)    SpO2 98%    BMI 26.26 kg/m  , BMI Body mass index is 26.26 kg/m. GEN: Well nourished, well developed, in no acute distress HEENT: normal Neck: no JVD, carotid bruits, or masses Cardiac: RRR; no murmurs, rubs, or gallops,no edema ; mild soreness to palpation of left chest after he hurt himself yesterday pulling a propane tank Respiratory:  clear to auscultation bilaterally, normal work of breathing GI: soft, nontender, nondistended, + BS MS: no deformity or atrophy Skin: warm and dry, no rash Neuro:  Strength and sensation are intact Psych: euthymic mood, full affect   EKG:   The ekg ordered today demonstrates NSR, no ST changes   Recent Labs: 02/21/2020: ALT 15 04/03/2020: BUN 14; Creatinine, Ser 1.10; NT-Pro BNP 159; Potassium 4.2; Sodium 142 05/14/2020: Hemoglobin 13.4; Platelets 252; TSH 2.000   Lipid Panel No results found for: CHOL, TRIG, HDL, CHOLHDL, VLDL, LDLCALC, LDLDIRECT   Other studies Reviewed: Additional studies/ records that were reviewed today with results demonstrating: labs reviewed.   ASSESSMENT AND PLAN:  CAD: s/p CABG.  No angina. Continue aggressive secondary prevention.  Taking baby aspirin daily.  I did advise him to avoid NSAIDs.  He  should try acetaminophen for minor aches and pains. DM2:A1C 6.3 in 08/2020.  Whole food, plant based diet.  Hyperlipidemia: LDL 56 in 08/2020.  We will need to verify that he is taking atorvastatin as opposed to simvastatin.  Both appear on his med list based on the dates, I think he is likely taking atorvastatin.  Avoid processed foods.  He does like to partake of bakery items at [employer]. DOE: Resolved with weight loss.   H/o TIA: Sx was vertigo/falling.  No residual sx.    Current medicines are reviewed at length with the patient today.  The patient concerns regarding his medicines were addressed.  The following changes have been made:  No change  Labs/ tests ordered today include:  No orders of the defined types were placed in this encounter.   Recommend 150 minutes/week of aerobic exercise Low fat, low carb, high fiber diet recommended  Disposition:   FU in 1 year   Signed, [place], MD  01/23/2021 2:14 PM    [facility] [address] [place], [place], [place]  [place] Phone: [phone]; Fax: [phone]

## 2021-01-23 ENCOUNTER — Encounter: Payer: Self-pay | Admitting: Interventional Cardiology

## 2021-01-23 ENCOUNTER — Other Ambulatory Visit: Payer: Self-pay

## 2021-01-23 ENCOUNTER — Ambulatory Visit (INDEPENDENT_AMBULATORY_CARE_PROVIDER_SITE_OTHER): Payer: Medicare Other | Admitting: Interventional Cardiology

## 2021-01-23 VITALS — BP 108/66 | HR 68 | Ht 70.0 in | Wt 183.0 lb

## 2021-01-23 DIAGNOSIS — E1159 Type 2 diabetes mellitus with other circulatory complications: Secondary | ICD-10-CM | POA: Diagnosis not present

## 2021-01-23 DIAGNOSIS — E782 Mixed hyperlipidemia: Secondary | ICD-10-CM

## 2021-01-23 DIAGNOSIS — I25709 Atherosclerosis of coronary artery bypass graft(s), unspecified, with unspecified angina pectoris: Secondary | ICD-10-CM | POA: Diagnosis not present

## 2021-01-23 DIAGNOSIS — Z8673 Personal history of transient ischemic attack (TIA), and cerebral infarction without residual deficits: Secondary | ICD-10-CM

## 2021-01-23 DIAGNOSIS — Z951 Presence of aortocoronary bypass graft: Secondary | ICD-10-CM

## 2021-01-23 DIAGNOSIS — Z794 Long term (current) use of insulin: Secondary | ICD-10-CM | POA: Diagnosis not present

## 2021-01-23 DIAGNOSIS — I10 Essential (primary) hypertension: Secondary | ICD-10-CM | POA: Diagnosis not present

## 2021-01-23 NOTE — Patient Instructions (Addendum)
Medication Instructions:  Your physician has recommended you make the following change in your medication: You should not be taking simvastatin.  Please check your medications and see if you have been taking.  The simvastatin has been removed from your medication list You should be taking Atorvastatin 80 mg by mouth daily.   *If you need a refill on your cardiac medications before your next appointment, please call your pharmacy*   Lab Work: none If you have labs (blood work) drawn today and your tests are completely normal, you will receive your results only by: MyChart Message (if you have MyChart) OR A paper copy in the mail If you have any lab test that is abnormal or we need to change your treatment, we will call you to review the results.   Testing/Procedures: none   Follow-Up: At [facility], you and your health needs are our priority.  As part of our continuing mission to provide you with exceptional heart care, we have created designated Provider Care Teams.  These Care Teams include your primary Cardiologist (physician) and Advanced Practice Providers (APPs -  Physician Assistants and Nurse Practitioners) who all work together to provide you with the care you need, when you need it.  We recommend signing up for the patient portal called "MyChart".  Sign up information is provided on this After Visit Summary.  MyChart is used to connect with patients for Virtual Visits (Telemedicine).  Patients are able to view lab/test results, encounter notes, upcoming appointments, etc.  Non-urgent messages can be sent to your provider as well.   To learn more about what you can do with MyChart, go to [email or web address].    Your next appointment:   12 month(s)  The format for your next appointment:   In Person  Provider:   Lance Muss, MD     Other Instructions

## 2021-04-30 DIAGNOSIS — R1013 Epigastric pain: Secondary | ICD-10-CM | POA: Diagnosis not present

## 2021-04-30 DIAGNOSIS — R140 Abdominal distension (gaseous): Secondary | ICD-10-CM | POA: Diagnosis not present

## 2021-05-07 ENCOUNTER — Other Ambulatory Visit: Payer: Self-pay | Admitting: Interventional Cardiology

## 2021-07-03 DIAGNOSIS — M79621 Pain in right upper arm: Secondary | ICD-10-CM | POA: Diagnosis not present

## 2021-07-24 ENCOUNTER — Telehealth: Payer: Self-pay | Admitting: Interventional Cardiology

## 2021-07-24 DIAGNOSIS — R42 Dizziness and giddiness: Secondary | ICD-10-CM | POA: Diagnosis not present

## 2021-07-24 DIAGNOSIS — R001 Bradycardia, unspecified: Secondary | ICD-10-CM | POA: Diagnosis not present

## 2021-07-24 NOTE — Telephone Encounter (Signed)
I returned call at 4:52 and was on hold until 5:00 with no answer.  Will ask triage to return call tomorrow.

## 2021-07-24 NOTE — Telephone Encounter (Signed)
Office called to say that the patient had dizziness, mild chest pain on left side, his hr with 50s and had EKG done show sinus of brady. His bp was good. NP lower his metoprolol to 12.5mg  twice a day. Please advise

## 2021-07-25 NOTE — Telephone Encounter (Signed)
I am ok with the metoprolol decrease.

## 2021-07-25 NOTE — Telephone Encounter (Signed)
Spoke to pt. Pt reports low HR at PCP yesterday along w/ dizziness. BP 117/64. PCP office instructed pt to decrease Lopressor to 12.5 mg BID to see if improvement in HR & dizziness.  Spoke to [place] at PCP. She confirmed above statements. Pt also reported to PCP about headaches that started in late June and worsened and became constant over the last 48 hours. Pt was light headed changing position, mild pressure to left upper chest.  Pt instructed to keep monitoring and let us know if dizziness occurs/worsens after the medication decrease. Patient verbalized understanding and agreeable to plan.  Will forward to cardiologist for their FYI and advisement if needed.

## 2021-07-26 ENCOUNTER — Encounter: Payer: Self-pay | Admitting: Interventional Cardiology

## 2021-07-30 NOTE — Progress Notes (Unsigned)
Office Visit    Patient Name: Tom Phelps Date of Encounter: 07/30/2021  Primary Care Provider:  Mila Palmer, MD Primary Cardiologist:  Lance Muss, MD Primary Electrophysiologist: None  Chief Complaint    Tom Phelps is a 61 y.o. male with PMH of CAD s/p CABG x2 with LIMA to the LAD and RSVG to OM 2., DM type II, HLD, anxiety, TIA 2018 on Plavix who presents today for complaint of dizziness and left-sided chest pain.  Past Medical History    Past Medical History:  Diagnosis Date   Actinic keratoses    Ankle dislocation    calcaneal tendons   Anxiety    Aortic atherosclerosis (HCC)    Chronic pain of left knee 11/18/2017   Coronary artery disease    DDD (degenerative disc disease), cervical    Diabetes mellitus without complication (HCC) 2019   no meds   Displaced other extraarticular fracture of right calcaneus, initial encounter for closed fracture 07/13/2017   DOE (dyspnea on exertion)    Dyspnea    Fatty liver    mild   Fracture of distal end of right fibula    Gallstones    History of kidney stones    Hypercholesteremia    Hypercholesterolemia    Hypertension    Lightheadedness    Neck pain    due to work    Neuromuscular disorder (HCC)    carpal tunnel bi lat   Prediabetes    Pulmonary nodules    Stress reaction    Stroke (HCC)    2018 no residuals   TIA (transient ischemic attack)    circulation    Past Surgical History:  Procedure Laterality Date   BACK SURGERY  2005   L4-L5 fusion   CHOLECYSTECTOMY N/A 03/02/2020   Procedure: LAPAROSCOPIC CHOLECYSTECTOMY;  Surgeon: Andria Meuse, MD;  Location: [facility];  Service: General;  Laterality: N/A;   CORONARY ARTERY BYPASS GRAFT N/A 07/29/2018   Procedure: CORONARY ARTERY BYPASS GRAFTING (CABG) x 2;  Surgeon: Corliss Skains, MD;  Location: [facility];  Service: Open Heart Surgery;  Laterality: N/A;   ENDOVEIN HARVEST OF GREATER SAPHENOUS VEIN Left 07/29/2018   Procedure: Endovein Harvest Of  Greater Saphenous Vein Left Thigh; Exploration only of right leg greater saphenous vein;  Surgeon: Corliss Skains, MD;  Location: [facility];  Service: Open Heart Surgery;  Laterality: Left;   INTRAVASCULAR PRESSURE WIRE/FFR STUDY N/A 07/20/2018   Procedure: INTRAVASCULAR PRESSURE WIRE/FFR STUDY;  Surgeon: Corky Crafts, MD;  [facility]VASIVE CV LAB;  Service: Cardiovascular;  Laterality: N/A;   LEFT HEART CATH AND CORONARY ANGIOGRAPHY N/A 07/20/2018   Procedure: LEFT HEART CATH AND CORONARY ANGIOGRAPHY;  Surgeon: Corky Crafts, MD;  Location: [facility] CV LAB;  Service: Cardiovascular;  Laterality: N/A;   TEE WITHOUT CARDIOVERSION N/A 07/29/2018   Procedure: TRANSESOPHAGEAL ECHOCARDIOGRAM (TEE);  Surgeon: Corliss Skains, MD;  Location: [facility] OR;  Service: Open Heart Surgery;  Laterality: N/A;    Allergies  Allergies  Allergen Reactions   Metformin And Related Shortness Of Breath and Other (See Comments)    Pressure on heart   Metformin Hcl     Other reaction(s): SOB   Oxycodone Itching   Oxycodone Hcl     Other reaction(s): itching    History of Present Illness    Geraldine Aro is a 61 year old male with the above-mentioned past medical history who presents today for complaint of dizziness and left-sided chest pain.  Mr.  Robb establish care by consultation from PCP with Dr. Eldridge Dace in 2020.  Patient was seen at that time for complaint of chest pain and SOB.  He underwent cardiac CTA that revealed severe LAD disease.  Cardiac cath completed and confirmed severe diffuse two-vessel disease and patient was referred for CABG.  Patient underwent successful CABG x2 on 02/12/2018.  Patient did have small pleural effusion that was drained following CABG without any complications.  2D echo following procedure with EF of 50-55% patient was seen several times in follow-up with no new cardiac complaints.  He was seen in 2021 for complaint of shortness of breath.  He was found to have  gallbladder disease and underwent cholecystectomy and 02/2020.  Patient continued to report however continued shortness of breath and 2D echo was completed with EF of 60-65%, no RWMA, normal valve function, borderline dilation of 37 mm of the ascending aorta.  Since last being seen in the office patient reports that he isincreased dizziness over the past 3 weeks. He states that this dizziness comes and goes and is not associated with any shortness of breath, chest pain or presyncope. Patient does note that he has been dealing with a cold over the past month.  His blood pressure today was well controlled at 124/82 and his heart rate was 65.  He states that overall he is feeling good from a cardiac perspective but is concerned with ongoing dizziness.  He was recently seen by his PCP and his metoprolol was decreased to 12.5.  Patient denies chest pain, palpitations, dyspnea, PND, orthopnea, nausea, vomiting, dizziness, syncope, edema, weight gain, or early satiety.   Home Medications    Current Outpatient Medications  Medication Sig Dispense Refill   aspirin EC 81 MG tablet Take 81 mg by mouth daily.     atorvastatin (LIPITOR) 80 MG tablet Take 1 tablet by mouth once daily 90 tablet 3   clopidogrel (PLAVIX) 75 MG tablet Take 75 mg by mouth daily.     diclofenac (VOLTAREN) 75 MG EC tablet Take 75 mg by mouth 2 (two) times daily as needed.     empagliflozin (JARDIANCE) 10 MG TABS tablet Take 1 tablet by mouth daily.     escitalopram (LEXAPRO) 10 MG tablet Take 1 tablet by mouth daily.     ibuprofen (ADVIL) 800 MG tablet Take 800 mg by mouth 3 (three) times daily as needed for mild pain or moderate pain.      metoprolol tartrate (LOPRESSOR) 25 MG tablet Take 1 tablet by mouth twice daily 180 tablet 2   No current facility-administered medications for this visit.     Review of Systems  Please see the history of present illness.    (+) Dizziness (+) Left knee pain  All other systems reviewed and are  otherwise negative except as noted above.  Physical Exam    Wt Readings from Last 3 Encounters:  01/23/21 183 lb (83 kg)  05/28/20 190 lb (86.2 kg)  04/10/20 200 lb (90.7 kg)   OF:BPZWC were no vitals filed for this visit.,There is no height or weight on file to calculate BMI.  Constitutional:      Appearance: Healthy appearance. Not in distress.  Neck:     Vascular: JVD normal.  Pulmonary:     Effort: Pulmonary effort is normal.     Breath sounds: No wheezing. No rales. Diminished in the bases Cardiovascular:     Normal rate. Regular rhythm. Normal S1. Normal S2.  Murmurs: There is no murmur.  Edema:    Peripheral edema absent.  Abdominal:     Palpations: Abdomen is soft non tender. There is no hepatomegaly.  Skin:    General: Skin is warm and dry.  Neurological:     General: No focal deficit present.     Mental Status: Alert and oriented to person, place and time.     Cranial Nerves: Cranial nerves are intact.  EKG/LABS/Other Studies Reviewed    ECG personally reviewed by me today -none completed today   Lab Results  Component Value Date   WBC 5.6 05/14/2020   HGB 13.4 05/14/2020   HCT 39.3 05/14/2020   MCV 91 05/14/2020   PLT 252 05/14/2020   Lab Results  Component Value Date   CREATININE 1.10 04/03/2020   BUN 14 04/03/2020   NA 142 04/03/2020   K 4.2 04/03/2020   CL 104 04/03/2020   CO2 22 04/03/2020   Lab Results  Component Value Date   ALT 15 02/21/2020   AST 19 02/21/2020   ALKPHOS 94 02/21/2020   BILITOT 0.9 02/21/2020   No results found for: "CHOL", "HDL", "LDLCALC", "LDLDIRECT", "TRIG", "CHOLHDL"  Lab Results  Component Value Date   HGBA1C 8.5 (H) 02/21/2020    Assessment & Plan    1.  Coronary artery disease: -AD s/p CABG x2 with LIMA to the LAD and RSVG to OM 2 in 2020 -Patient reports that he is no complaints of angina or equivalent at this time -Current GDMT consist of ASA 81 mg, Lipitor 80 mg, Plavix 75 mg, and metoprolol 12.5  mg twice daily -Patient was encouraged to discontinue NSAIDs and consider Tylenol arthritis for his knee pain  2.  Dizziness: -Has history of vertigo and recent complaint of cold -He was seen by PCP on 7/25 and his metoprolol was reduced to 12.5 twice daily. -I encouraged patient to discuss further with PCP a referral to ENT to evaluate inner ear for possible vertigo. -Patient's most recent hemoglobin was 14.7 hematocrit was 43.6, potassium 4.1 sodium 138, creatinine 1.01 BUN 21  3.  Essential hypertension: -Patient's blood pressure today was well controlled at 124/82 -Continue metoprolol as mentioned above  4.  Hyperlipidemia: -Patient's last LDL was was 56 at goal of less than 70 -Continue atorvastatin 80 mg daily  Disposition: Follow-up with Lance Muss, MD or APP in 12 months   Medication Adjustments/Labs and Tests Ordered: Current medicines are reviewed at length with the patient today.  Concerns regarding medicines are outlined above.   Signed, Napoleon Form, Leodis Rains, NP 07/30/2021, 4:02 PM [facility]

## 2021-08-01 ENCOUNTER — Encounter: Payer: Self-pay | Admitting: Nurse Practitioner

## 2021-08-01 ENCOUNTER — Ambulatory Visit (INDEPENDENT_AMBULATORY_CARE_PROVIDER_SITE_OTHER): Payer: Medicare Other | Admitting: Nurse Practitioner

## 2021-08-01 VITALS — BP 124/82 | HR 65 | Ht 70.0 in | Wt 186.6 lb

## 2021-08-01 DIAGNOSIS — R42 Dizziness and giddiness: Secondary | ICD-10-CM | POA: Diagnosis not present

## 2021-08-01 DIAGNOSIS — E785 Hyperlipidemia, unspecified: Secondary | ICD-10-CM

## 2021-08-01 DIAGNOSIS — I10 Essential (primary) hypertension: Secondary | ICD-10-CM

## 2021-08-01 DIAGNOSIS — I25709 Atherosclerosis of coronary artery bypass graft(s), unspecified, with unspecified angina pectoris: Secondary | ICD-10-CM | POA: Diagnosis not present

## 2021-08-01 NOTE — Patient Instructions (Addendum)
Medication Instructions:  Your physician recommends that you continue on your current medications as directed. Please refer to the Current Medication list given to you today.  *If you need a refill on your cardiac medications before your next appointment, please call your pharmacy*   Lab Work: None ordered  If you have labs (blood work) drawn today and your tests are completely normal, you will receive your results only by: MyChart Message (if you have MyChart) OR A paper copy in the mail If you have any lab test that is abnormal or we need to change your treatment, we will call you to review the results.   Testing/Procedures: None ordered  Follow-Up: At [facility], you and your health needs are our priority.  As part of our continuing mission to provide you with exceptional heart care, we have created designated Provider Care Teams.  These Care Teams include your primary Cardiologist (physician) and Advanced Practice Providers (APPs -  Physician Assistants and Nurse Practitioners) who all work together to provide you with the care you need, when you need it.  We recommend signing up for the patient portal called "MyChart".  Sign up information is provided on this After Visit Summary.  MyChart is used to connect with patients for Virtual Visits (Telemedicine).  Patients are able to view lab/test results, encounter notes, upcoming appointments, etc.  Non-urgent messages can be sent to your provider as well.   To learn more about what you can do with MyChart, go to [email or web address].    Your next appointment:   As planned   The format for your next appointment:   In Person  Provider:   Lance Muss, MD    Other Instructions   Important Information About Sugar

## 2021-08-07 ENCOUNTER — Other Ambulatory Visit: Payer: Self-pay | Admitting: Interventional Cardiology

## 2021-09-20 DIAGNOSIS — E7800 Pure hypercholesterolemia, unspecified: Secondary | ICD-10-CM | POA: Diagnosis not present

## 2021-09-20 DIAGNOSIS — I10 Essential (primary) hypertension: Secondary | ICD-10-CM | POA: Diagnosis not present

## 2021-09-20 DIAGNOSIS — E0590 Thyrotoxicosis, unspecified without thyrotoxic crisis or storm: Secondary | ICD-10-CM | POA: Diagnosis not present

## 2021-09-20 DIAGNOSIS — Z0000 Encounter for general adult medical examination without abnormal findings: Secondary | ICD-10-CM | POA: Diagnosis not present

## 2021-09-20 DIAGNOSIS — E213 Hyperparathyroidism, unspecified: Secondary | ICD-10-CM | POA: Diagnosis not present

## 2021-09-20 DIAGNOSIS — E1140 Type 2 diabetes mellitus with diabetic neuropathy, unspecified: Secondary | ICD-10-CM | POA: Diagnosis not present

## 2021-09-20 DIAGNOSIS — E1169 Type 2 diabetes mellitus with other specified complication: Secondary | ICD-10-CM | POA: Diagnosis not present

## 2021-09-20 DIAGNOSIS — Z8673 Personal history of transient ischemic attack (TIA), and cerebral infarction without residual deficits: Secondary | ICD-10-CM | POA: Diagnosis not present

## 2021-09-20 DIAGNOSIS — Z79899 Other long term (current) drug therapy: Secondary | ICD-10-CM | POA: Diagnosis not present

## 2021-09-20 DIAGNOSIS — I700 Atherosclerosis of aorta: Secondary | ICD-10-CM | POA: Diagnosis not present

## 2021-10-22 DIAGNOSIS — M19072 Primary osteoarthritis, left ankle and foot: Secondary | ICD-10-CM | POA: Diagnosis not present

## 2021-10-22 DIAGNOSIS — L609 Nail disorder, unspecified: Secondary | ICD-10-CM | POA: Diagnosis not present

## 2021-10-22 DIAGNOSIS — B351 Tinea unguium: Secondary | ICD-10-CM | POA: Diagnosis not present

## 2021-10-22 DIAGNOSIS — M722 Plantar fascial fibromatosis: Secondary | ICD-10-CM | POA: Diagnosis not present

## 2021-10-22 DIAGNOSIS — I70203 Unspecified atherosclerosis of native arteries of extremities, bilateral legs: Secondary | ICD-10-CM | POA: Diagnosis not present

## 2021-10-22 DIAGNOSIS — M19071 Primary osteoarthritis, right ankle and foot: Secondary | ICD-10-CM | POA: Diagnosis not present

## 2021-10-30 DIAGNOSIS — L601 Onycholysis: Secondary | ICD-10-CM | POA: Diagnosis not present

## 2021-11-06 DIAGNOSIS — I70203 Unspecified atherosclerosis of native arteries of extremities, bilateral legs: Secondary | ICD-10-CM | POA: Diagnosis not present

## 2021-11-06 DIAGNOSIS — M19071 Primary osteoarthritis, right ankle and foot: Secondary | ICD-10-CM | POA: Diagnosis not present

## 2021-11-06 DIAGNOSIS — B351 Tinea unguium: Secondary | ICD-10-CM | POA: Diagnosis not present

## 2021-11-06 DIAGNOSIS — M19072 Primary osteoarthritis, left ankle and foot: Secondary | ICD-10-CM | POA: Diagnosis not present

## 2021-11-06 DIAGNOSIS — M722 Plantar fascial fibromatosis: Secondary | ICD-10-CM | POA: Diagnosis not present

## 2021-11-19 DIAGNOSIS — M19071 Primary osteoarthritis, right ankle and foot: Secondary | ICD-10-CM | POA: Diagnosis not present

## 2021-11-19 DIAGNOSIS — M722 Plantar fascial fibromatosis: Secondary | ICD-10-CM | POA: Diagnosis not present

## 2021-11-19 DIAGNOSIS — I70203 Unspecified atherosclerosis of native arteries of extremities, bilateral legs: Secondary | ICD-10-CM | POA: Diagnosis not present

## 2021-11-19 DIAGNOSIS — B351 Tinea unguium: Secondary | ICD-10-CM | POA: Diagnosis not present

## 2021-11-19 DIAGNOSIS — M19072 Primary osteoarthritis, left ankle and foot: Secondary | ICD-10-CM | POA: Diagnosis not present

## 2021-12-04 DIAGNOSIS — M5410 Radiculopathy, site unspecified: Secondary | ICD-10-CM | POA: Diagnosis not present

## 2021-12-25 DIAGNOSIS — M961 Postlaminectomy syndrome, not elsewhere classified: Secondary | ICD-10-CM | POA: Diagnosis not present

## 2021-12-25 DIAGNOSIS — M7918 Myalgia, other site: Secondary | ICD-10-CM | POA: Diagnosis not present

## 2021-12-25 DIAGNOSIS — M461 Sacroiliitis, not elsewhere classified: Secondary | ICD-10-CM | POA: Diagnosis not present

## 2022-01-02 DIAGNOSIS — R051 Acute cough: Secondary | ICD-10-CM | POA: Diagnosis not present

## 2022-01-10 DIAGNOSIS — M170 Bilateral primary osteoarthritis of knee: Secondary | ICD-10-CM | POA: Diagnosis not present

## 2022-01-25 DIAGNOSIS — M25561 Pain in right knee: Secondary | ICD-10-CM | POA: Diagnosis not present

## 2022-02-06 DIAGNOSIS — M1711 Unilateral primary osteoarthritis, right knee: Secondary | ICD-10-CM | POA: Diagnosis not present

## 2022-02-10 ENCOUNTER — Other Ambulatory Visit: Payer: Self-pay | Admitting: Interventional Cardiology

## 2022-02-26 ENCOUNTER — Other Ambulatory Visit: Payer: Self-pay | Admitting: Interventional Cardiology

## 2022-05-29 DIAGNOSIS — I700 Atherosclerosis of aorta: Secondary | ICD-10-CM | POA: Diagnosis not present

## 2022-05-29 DIAGNOSIS — E213 Hyperparathyroidism, unspecified: Secondary | ICD-10-CM | POA: Diagnosis not present

## 2022-05-29 DIAGNOSIS — E0590 Thyrotoxicosis, unspecified without thyrotoxic crisis or storm: Secondary | ICD-10-CM | POA: Diagnosis not present

## 2022-05-29 DIAGNOSIS — E119 Type 2 diabetes mellitus without complications: Secondary | ICD-10-CM | POA: Diagnosis not present

## 2022-05-30 DIAGNOSIS — E213 Hyperparathyroidism, unspecified: Secondary | ICD-10-CM | POA: Diagnosis not present

## 2022-05-30 DIAGNOSIS — E1169 Type 2 diabetes mellitus with other specified complication: Secondary | ICD-10-CM | POA: Diagnosis not present

## 2022-05-30 DIAGNOSIS — I700 Atherosclerosis of aorta: Secondary | ICD-10-CM | POA: Diagnosis not present

## 2022-05-30 DIAGNOSIS — E0590 Thyrotoxicosis, unspecified without thyrotoxic crisis or storm: Secondary | ICD-10-CM | POA: Diagnosis not present

## 2022-05-30 DIAGNOSIS — K219 Gastro-esophageal reflux disease without esophagitis: Secondary | ICD-10-CM | POA: Diagnosis not present

## 2022-07-04 DIAGNOSIS — R051 Acute cough: Secondary | ICD-10-CM | POA: Diagnosis not present

## 2022-07-04 DIAGNOSIS — J069 Acute upper respiratory infection, unspecified: Secondary | ICD-10-CM | POA: Diagnosis not present

## 2022-07-04 DIAGNOSIS — U071 COVID-19: Secondary | ICD-10-CM | POA: Diagnosis not present

## 2022-07-04 DIAGNOSIS — J029 Acute pharyngitis, unspecified: Secondary | ICD-10-CM | POA: Diagnosis not present

## 2022-07-04 DIAGNOSIS — Z03818 Encounter for observation for suspected exposure to other biological agents ruled out: Secondary | ICD-10-CM | POA: Diagnosis not present

## 2022-08-15 ENCOUNTER — Other Ambulatory Visit: Payer: Self-pay | Admitting: Interventional Cardiology

## 2022-08-15 ENCOUNTER — Encounter: Payer: Self-pay | Admitting: Interventional Cardiology

## 2022-08-19 DIAGNOSIS — M940 Chondrocostal junction syndrome [Tietze]: Secondary | ICD-10-CM | POA: Diagnosis not present

## 2022-08-19 DIAGNOSIS — R079 Chest pain, unspecified: Secondary | ICD-10-CM | POA: Diagnosis not present

## 2022-09-03 ENCOUNTER — Other Ambulatory Visit: Payer: Self-pay | Admitting: Interventional Cardiology

## 2022-09-14 ENCOUNTER — Other Ambulatory Visit: Payer: Self-pay | Admitting: Interventional Cardiology

## 2022-09-26 DIAGNOSIS — Z0000 Encounter for general adult medical examination without abnormal findings: Secondary | ICD-10-CM | POA: Diagnosis not present

## 2022-09-26 DIAGNOSIS — Z1211 Encounter for screening for malignant neoplasm of colon: Secondary | ICD-10-CM | POA: Diagnosis not present

## 2022-09-26 DIAGNOSIS — R1013 Epigastric pain: Secondary | ICD-10-CM | POA: Diagnosis not present

## 2022-09-26 DIAGNOSIS — E0590 Thyrotoxicosis, unspecified without thyrotoxic crisis or storm: Secondary | ICD-10-CM | POA: Diagnosis not present

## 2022-09-26 DIAGNOSIS — E1169 Type 2 diabetes mellitus with other specified complication: Secondary | ICD-10-CM | POA: Diagnosis not present

## 2022-09-26 DIAGNOSIS — K219 Gastro-esophageal reflux disease without esophagitis: Secondary | ICD-10-CM | POA: Diagnosis not present

## 2022-09-26 DIAGNOSIS — E119 Type 2 diabetes mellitus without complications: Secondary | ICD-10-CM | POA: Diagnosis not present

## 2022-09-26 DIAGNOSIS — Z79899 Other long term (current) drug therapy: Secondary | ICD-10-CM | POA: Diagnosis not present

## 2022-09-26 DIAGNOSIS — I700 Atherosclerosis of aorta: Secondary | ICD-10-CM | POA: Diagnosis not present

## 2022-10-04 ENCOUNTER — Other Ambulatory Visit: Payer: Self-pay | Admitting: Interventional Cardiology

## 2022-10-06 ENCOUNTER — Encounter: Payer: Self-pay | Admitting: Gastroenterology

## 2022-10-14 ENCOUNTER — Ambulatory Visit (INDEPENDENT_AMBULATORY_CARE_PROVIDER_SITE_OTHER): Payer: 59 | Admitting: Gastroenterology

## 2022-10-14 ENCOUNTER — Other Ambulatory Visit (INDEPENDENT_AMBULATORY_CARE_PROVIDER_SITE_OTHER): Payer: 59

## 2022-10-14 ENCOUNTER — Encounter: Payer: Self-pay | Admitting: Gastroenterology

## 2022-10-14 VITALS — BP 120/74 | HR 64 | Ht 70.0 in | Wt 186.8 lb

## 2022-10-14 DIAGNOSIS — R109 Unspecified abdominal pain: Secondary | ICD-10-CM

## 2022-10-14 DIAGNOSIS — Z8249 Family history of ischemic heart disease and other diseases of the circulatory system: Secondary | ICD-10-CM

## 2022-10-14 DIAGNOSIS — R0602 Shortness of breath: Secondary | ICD-10-CM

## 2022-10-14 DIAGNOSIS — I2089 Other forms of angina pectoris: Secondary | ICD-10-CM | POA: Diagnosis not present

## 2022-10-14 LAB — CBC WITH DIFFERENTIAL/PLATELET
Basophils Absolute: 0 10*3/uL (ref 0.0–0.1)
Basophils Relative: 0.6 % (ref 0.0–3.0)
Eosinophils Absolute: 0.1 10*3/uL (ref 0.0–0.7)
Eosinophils Relative: 1.9 % (ref 0.0–5.0)
HCT: 41.3 % (ref 39.0–52.0)
Hemoglobin: 13.5 g/dL (ref 13.0–17.0)
Lymphocytes Relative: 23.4 % (ref 12.0–46.0)
Lymphs Abs: 1.6 10*3/uL (ref 0.7–4.0)
MCHC: 32.8 g/dL (ref 30.0–36.0)
MCV: 90.6 fL (ref 78.0–100.0)
Monocytes Absolute: 0.4 10*3/uL (ref 0.1–1.0)
Monocytes Relative: 5.2 % (ref 3.0–12.0)
Neutro Abs: 4.8 10*3/uL (ref 1.4–7.7)
Neutrophils Relative %: 68.9 % (ref 43.0–77.0)
Platelets: 251 10*3/uL (ref 150.0–400.0)
RBC: 4.56 Mil/uL (ref 4.22–5.81)
RDW: 13.7 % (ref 11.5–15.5)
WBC: 6.9 10*3/uL (ref 4.0–10.5)

## 2022-10-14 LAB — HEPATIC FUNCTION PANEL
ALT: 8 U/L (ref 0–53)
AST: 16 U/L (ref 0–37)
Albumin: 4.2 g/dL (ref 3.5–5.2)
Alkaline Phosphatase: 93 U/L (ref 39–117)
Bilirubin, Direct: 0.2 mg/dL (ref 0.0–0.3)
Total Bilirubin: 0.8 mg/dL (ref 0.2–1.2)
Total Protein: 6.5 g/dL (ref 6.0–8.3)

## 2022-10-14 LAB — LIPASE: Lipase: 11 U/L (ref 11.0–59.0)

## 2022-10-14 NOTE — Progress Notes (Signed)
Chief Complaint: GERD Primary GI MD: Gentry Fitz   HPI: 62 year old male history of CABG x 2 (2020), cholelithiasis, type 2 diabetes, presents for evaluation of GERD and CRC screening.  Recent lab work done through PCP (see scanned records) 09/26/2022 with CMP notable for T. bili 1.3, otherwise normal and CBC with no anemia, normal platelets.  Low B12 (207) Discussed the use of AI scribe software for clinical note transcription with the patient, who gave verbal consent to proceed.  History of Present Illness   The patient presents with epigastric pain that is exacerbated by movement, particularly leaning forward. The pain is described as a sensation of 'something blocking the air' and is associated with shortness of breath. The pain is not associated with eating. States he feels this pain if he lifts things at work, lifts his dog, or does anything exertional.  This pain has been ongoing for years and is similar to the discomfort experienced prior to his bypass surgery. The patient has a history of gallbladder removal due to suspected gallstones, but the pain persisted post-surgery. The patient also has a family history of heart disease.      Past Medical History:  Diagnosis Date   Actinic keratoses    Ankle dislocation    calcaneal tendons   Anxiety    Aortic atherosclerosis (HCC)    Chronic pain of left knee 11/18/2017   Coronary artery disease    DDD (degenerative disc disease), cervical    Diabetes mellitus without complication (HCC) 2019   no meds   Displaced other extraarticular fracture of right calcaneus, initial encounter for closed fracture 07/13/2017   DOE (dyspnea on exertion)    Dyspnea    Fatty liver    mild   Fracture of distal end of right fibula    Gallstones    History of kidney stones    Hypercholesteremia    Hypercholesterolemia    Hypertension    Lightheadedness    Neck pain    due to work    Neuromuscular disorder (HCC)    carpal tunnel bi lat    Prediabetes    Pulmonary nodules    Stress reaction    Stroke (HCC)    2018 no residuals   TIA (transient ischemic attack)    circulation     Past Surgical History:  Procedure Laterality Date   BACK SURGERY  2005   L4-L5 fusion   CHOLECYSTECTOMY N/A 03/02/2020   Procedure: LAPAROSCOPIC CHOLECYSTECTOMY;  Surgeon: Andria Meuse, MD;  Location: [facility];  Service: General;  Laterality: N/A;   CORONARY ARTERY BYPASS GRAFT N/A 07/29/2018   Procedure: CORONARY ARTERY BYPASS GRAFTING (CABG) x 2;  Surgeon: Corliss Skains, MD;  Location: [facility] OR;  Service: Open Heart Surgery;  Laterality: N/A;   CORONARY PRESSURE/FFR STUDY N/A 07/20/2018   Procedure: INTRAVASCULAR PRESSURE WIRE/FFR STUDY;  Surgeon: Corky Crafts, MD;  Location: [facility] INVASIVE CV LAB;  Service: Cardiovascular;  Laterality: N/A;   ENDOVEIN HARVEST OF GREATER SAPHENOUS VEIN Left 07/29/2018   Procedure: Endovein Harvest Of Greater Saphenous Vein Left Thigh; Exploration only of right leg greater saphenous vein;  Surgeon: Corliss Skains, MD;  Location: [facility] OR;  Service: Open Heart Surgery;  Laterality: Left;   LEFT HEART CATH AND CORONARY ANGIOGRAPHY N/A 07/20/2018   Procedure: LEFT HEART CATH AND CORONARY ANGIOGRAPHY;  Surgeon: Corky Crafts, MD;  Location: [facility] INVASIVE CV LAB;  Service: Cardiovascular;  Laterality: N/A;   TEE WITHOUT CARDIOVERSION N/A 07/29/2018  Procedure: TRANSESOPHAGEAL ECHOCARDIOGRAM (TEE);  Surgeon: Corliss Skains, MD;  Location: [facility] OR;  Service: Open Heart Surgery;  Laterality: N/A;    Current Outpatient Medications  Medication Sig Dispense Refill   aspirin EC 81 MG tablet Take 81 mg by mouth daily.     atorvastatin (LIPITOR) 80 MG tablet Take 1 tablet (80 mg total) by mouth daily. 90 tablet 1   clopidogrel (PLAVIX) 75 MG tablet Take 75 mg by mouth daily.     empagliflozin (JARDIANCE) 10 MG TABS tablet Take 1 tablet by mouth daily.     ibuprofen (ADVIL) 800 MG tablet Take 800 mg by  mouth 3 (three) times daily as needed for mild pain or moderate pain.      metoprolol tartrate (LOPRESSOR) 25 MG tablet Take 1 tablet (25 mg total) by mouth 2 (two) times daily. 60 tablet 1   No current facility-administered medications for this visit.    Allergies as of 10/14/2022 - Review Complete 10/14/2022  Allergen Reaction Noted   Metformin and related Shortness Of Breath and Other (See Comments) 07/13/2018   Metformin hcl  01/31/2020   Codeine Itching 07/03/2021   Oxycodone Itching 06/07/2018   Oxycodone hcl  01/31/2020    Family History  Problem Relation Age of Onset   CAD Mother    CAD Father     Social History   Socioeconomic History   Marital status: Married    Spouse name: Not on file   Number of children: 2   Years of education: Not on file   Highest education level: Not on file  Occupational History   Not on file  Tobacco Use   Smoking status: Never   Smokeless tobacco: Never  Vaping Use   Vaping status: Never Used  Substance and Sexual Activity   Alcohol use: Never   Drug use: Never   Sexual activity: Yes    Comment: married Estate agent)  Other Topics Concern   Not on file  Social History Narrative   Right handed   Drinks caffeine   Two story home   Social Determinants of Health   Financial Resource Strain: Not on file  Food Insecurity: Not on file  Transportation Needs: Not on file  Physical Activity: Not on file  Stress: Not on file  Social Connections: Not on file  Intimate Partner Violence: Not on file    Review of Systems:    Constitutional: No weight loss, fever, chills, weakness or fatigue HEENT: Eyes: No change in vision               Ears, Nose, Throat:  No change in hearing or congestion Skin: No rash or itching Cardiovascular: No chest pain, chest pressure or palpitations   Respiratory: No SOB or cough Gastrointestinal: See HPI and otherwise negative Genitourinary: No dysuria or change in urinary frequency Neurological: No  headache, dizziness or syncope Musculoskeletal: No new muscle or joint pain Hematologic: No bleeding or bruising Psychiatric: No history of depression or anxiety    Physical Exam:  Vital signs: BP 120/74   Pulse 64   Ht 5\' 10"  (1.778 m)   Wt 84.7 kg   BMI 26.80 kg/m   Constitutional: NAD, Well developed, Well nourished, alert and cooperative Head:  Normocephalic and atraumatic. Eyes:   PEERL, EOMI. No icterus. Conjunctiva pink. Respiratory: Respirations even and unlabored. Lungs clear to auscultation bilaterally.   No wheezes, crackles, or rhonchi.  Cardiovascular:  Regular rate and rhythm. No peripheral edema, cyanosis or pallor.  Gastrointestinal:  Soft, nondistended, some mild tenderness around sternum. No rebound or guarding. Normal bowel sounds. No appreciable masses or hepatomegaly. Rectal:  Not performed.  Msk:  Symmetrical without gross deformities. Without edema, no deformity or joint abnormality.  Neurologic:  Alert and  oriented x4;  grossly normal neurologically.  Skin:   Dry and intact without significant lesions or rashes. Psychiatric: Oriented to person, place and time. Demonstrates good judgement and reason without abnormal affect or behaviors.  RELEVANT LABS AND IMAGING: CBC    Component Value Date/Time   WBC 5.6 05/14/2020 1015   WBC 13.1 (H) 02/21/2020 1353   RBC 4.34 05/14/2020 1015   RBC 4.99 02/21/2020 1353   HGB 13.4 05/14/2020 1015   HCT 39.3 05/14/2020 1015   PLT 252 05/14/2020 1015   MCV 91 05/14/2020 1015   MCH 30.9 05/14/2020 1015   MCH 30.5 02/21/2020 1353   MCHC 34.1 05/14/2020 1015   MCHC 33.9 02/21/2020 1353   RDW 13.0 05/14/2020 1015   LYMPHSABS 2.0 02/21/2020 1353   MONOABS 0.8 02/21/2020 1353   EOSABS 0.0 02/21/2020 1353   BASOSABS 0.0 02/21/2020 1353    CMP     Component Value Date/Time   NA 142 04/03/2020 1418   K 4.2 04/03/2020 1418   CL 104 04/03/2020 1418   CO2 22 04/03/2020 1418   GLUCOSE 137 (H) 04/03/2020 1418    GLUCOSE 260 (H) 02/21/2020 1353   BUN 14 04/03/2020 1418   CREATININE 1.10 04/03/2020 1418   CALCIUM 9.5 04/03/2020 1418   PROT 8.1 02/21/2020 1353   ALBUMIN 4.7 02/21/2020 1353   AST 19 02/21/2020 1353   ALT 15 02/21/2020 1353   ALKPHOS 94 02/21/2020 1353   BILITOT 0.9 02/21/2020 1353   GFRNONAA >60 02/21/2020 1353   GFRAA >60 10/01/2019 2105     Assessment/Plan:      Epigastric Pain Chronic, exertional, positional, non-prandial epigastric pain. No dysphagia, heartburn, or change in bowel habits. History of cholecystectomy. Pain similar to prior angina. -Refer to Cardiology for evaluation of possible angina. -Order Lipase to rule out pancreatic etiology, though less likely. - CBC, CMP -Consider chest X-ray and CT scan if pain persists after cardiac evaluation.  Colon Cancer Screening No history of colonoscopy. -Schedule colonoscopy after cardiac evaluation.  Follow-up in 6 months to reassess epigastric pain and ensure completion of colonoscopy.      Lara Mulch [facility] 10/14/2022, 3:20 PM  Cc: Mila Palmer, MD

## 2022-10-14 NOTE — Patient Instructions (Addendum)
Your provider has requested that you go to the basement level for lab work before leaving today. Press "B" on the elevator. The lab is located at the first door on the left as you exit the elevator.   A referral was place for Cardiology they will call you to scheduled an appointment.  Follow up in 6 months  If your blood pressure at your visit was 140/90 or greater, please contact your primary care physician to follow up on this.  _______________________________________________________  If you are age 56 or older, your body mass index should be between 23-30. Your Body mass index is 26.8 kg/m. If this is out of the aforementioned range listed, please consider follow up with your Primary Care Provider.  If you are age 48 or younger, your body mass index should be between 19-25. Your Body mass index is 26.8 kg/m. If this is out of the aformentioned range listed, please consider follow up with your Primary Care Provider.   ________________________________________________________  The [place] [facility] providers would like to encourage you to use [facility] to communicate with providers for non-urgent requests or questions.  Due to long hold times on the telephone, sending your provider a message by [facility] may be a faster and more efficient way to get a response.  Please allow 48 business hours for a response.  Please remember that this is for non-urgent requests.  _______________________________________________________  Due to recent changes in healthcare laws, you may see the results of your imaging and laboratory studies on MyChart before your provider has had a chance to review them.  We understand that in some cases there may be results that are confusing or concerning to you. Not all laboratory results come back in the same time frame and the provider may be waiting for multiple results in order to interpret others.  Please give [place] 48 hours in order for your provider to thoroughly review all the  results before contacting the office for clarification of your results.    Thank you for entrusting me with your care and choosing [facility].  Bayley Leanna Sato, PA-C

## 2022-10-21 ENCOUNTER — Telehealth: Payer: Self-pay | Admitting: Gastroenterology

## 2022-10-21 NOTE — Telephone Encounter (Signed)
Inbound call from patient stating that he called his cardiologist and they did not receive a  referral. Please advise.

## 2022-10-22 ENCOUNTER — Telehealth: Payer: Self-pay

## 2022-10-22 NOTE — Telephone Encounter (Signed)
Patient called wanted to know the status on cardiology referral.  Spoke to cardiology patient has a appointment on 12/10/22 that's the earliest they can get him into the office. Patient was place on waiting list if there is a cancellation he will be notified. Patient was advised and aware if pain is unbearable he can go to the emergency room as well.

## 2022-10-23 ENCOUNTER — Telehealth: Payer: Self-pay | Admitting: Nurse Practitioner

## 2022-10-23 NOTE — Telephone Encounter (Signed)
Spoke with pt who reports increasing SOB especially when he bends forward.  Pt denies current CP, dizziness or edema.  He reports he is taking medications as prescribed.  Pt states he has seen his PCP who recommends he see cardiology for further evaluation.  Appointment scheduled for 10/31/2022 at 1030 with Cristi Loron,  Reviewed ED precautions.  Pt verbalizes understanding and agrees with current plan.

## 2022-10-23 NOTE — Telephone Encounter (Signed)
  Per MyChart scheduling message:   Initial complaint:  Having shortness of breath when leaning forward   Pt c/o Shortness Of Breath: STAT if SOB developed within the last 24 hours or pt is noticeably SOB on the phone  1. Are you currently SOB (can you hear that pt is SOB on the phone)?   2. How long have you been experiencing SOB?   3. Are you SOB when sitting or when up moving around?   4. Are you currently experiencing any other symptoms?    Yes anytime leaning forward to picking up something from the floor. For long time. No . No for last question

## 2022-10-24 ENCOUNTER — Ambulatory Visit: Payer: 59 | Attending: Nurse Practitioner | Admitting: Nurse Practitioner

## 2022-10-24 ENCOUNTER — Encounter: Payer: Self-pay | Admitting: Nurse Practitioner

## 2022-10-24 VITALS — BP 120/82 | HR 60 | Ht 70.0 in | Wt 187.0 lb

## 2022-10-24 DIAGNOSIS — R1013 Epigastric pain: Secondary | ICD-10-CM

## 2022-10-24 DIAGNOSIS — I10 Essential (primary) hypertension: Secondary | ICD-10-CM

## 2022-10-24 DIAGNOSIS — I25709 Atherosclerosis of coronary artery bypass graft(s), unspecified, with unspecified angina pectoris: Secondary | ICD-10-CM | POA: Diagnosis not present

## 2022-10-24 DIAGNOSIS — E785 Hyperlipidemia, unspecified: Secondary | ICD-10-CM

## 2022-10-24 DIAGNOSIS — E1159 Type 2 diabetes mellitus with other circulatory complications: Secondary | ICD-10-CM

## 2022-10-24 DIAGNOSIS — Z794 Long term (current) use of insulin: Secondary | ICD-10-CM | POA: Diagnosis not present

## 2022-10-24 DIAGNOSIS — R0602 Shortness of breath: Secondary | ICD-10-CM

## 2022-10-24 MED ORDER — NITROGLYCERIN 0.4 MG SL SUBL: 0.4000 mg | SUBLINGUAL_TABLET | SUBLINGUAL | 3 refills | Status: DC | PRN

## 2022-10-24 NOTE — Progress Notes (Signed)
Cardiology Office Note    Patient Name: Tom Phelps Date of Encounter: 10/24/2022  Primary Care Provider:  Mila Palmer, MD Primary Cardiologist:  Lance Muss, MD Primary Electrophysiologist: None   Past Medical History    Past Medical History:  Diagnosis Date   Actinic keratoses    Ankle dislocation    calcaneal tendons   Anxiety    Aortic atherosclerosis (HCC)    Chronic pain of left knee 11/18/2017   Coronary artery disease    DDD (degenerative disc disease), cervical    Diabetes mellitus without complication (HCC) 2019   no meds   Displaced other extraarticular fracture of right calcaneus, initial encounter for closed fracture 07/13/2017   DOE (dyspnea on exertion)    Dyspnea    Fatty liver    mild   Fracture of distal end of right fibula    Gallstones    History of kidney stones    Hypercholesteremia    Hypercholesterolemia    Hypertension    Lightheadedness    Neck pain    due to work    Neuromuscular disorder (HCC)    carpal tunnel bi lat   Prediabetes    Pulmonary nodules    Stress reaction    Stroke (HCC)    2018 no residuals   TIA (transient ischemic attack)    circulation     History of Present Illness  Tom Phelps is a 62 y.o. male with PMH of CAD s/p CABG x2 with LIMA to the LAD and RSVG to OM 2., DM type II, HLD, anxiety, TIA 2018 on Plavix who presents today for complaint of SOB.  Tom Phelps was last seen on 08/01/2021 for annual follow-up.  During visit patient reported increased dizziness over the past 3 weeks were stable during visit.  He was seen by his PCP and metoprolol was decreased due to his dizziness.  He was referred to ENT for evaluation of possible vertigo.  He was doing well overall with no other cardiac complaints.  Patient was recently seen by his gastroenterologist and noted epigastric discomfort.  He contacted our office today on 10/24/2022 with complaint of increasing shortness of breath especially when bending forward.  During  today's visit the patient reports that he has been experiencing chest pain occurs with leaning forward and is reproduced with lifting boxes or heavy items.  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  His blood pressure today was controlled at 120/82 and heart rate is 60 bpm.  He has been compliant with his current medications and denies any adverse reactions.  He does also report some shortness of breath with these activities and notes that his chest sometimes feels heavy during these episodes.  He reports that his discomfort resolved spontaneously and is primarily located substernally and does not travel down his arms or up his neck.  Patient denies palpitations, dyspnea, PND, orthopnea, nausea, vomiting, dizziness, syncope, edema, weight gain, or early satiety.   Review of Systems  Please see the history of present illness.    All other systems reviewed and are otherwise negative except as noted above.  Physical Exam    Wt Readings from Last 3 Encounters:  10/14/22 186 lb 12.8 oz (84.7 kg)  08/01/21 186 lb 9.6 oz (84.6 kg)  01/23/21 183 lb (83 kg)   ZO:[id number] were no vitals filed for this visit.,There is no height or weight on file to calculate BMI. GEN: Well nourished, well developed in  no acute distress Neck: No JVD; No carotid bruits Pulmonary: Clear to auscultation without rales, wheezing or rhonchi  Cardiovascular: Normal rate. Regular rhythm. Normal S1. Normal S2.   Murmurs: There is no murmur.  ABDOMEN: Soft, non-tender, non-distended EXTREMITIES:  No edema; No deformity   EKG/LABS/ Recent Cardiac Studies   ECG personally reviewed by me today -sinus rhythm with nonspecific T WI in leads V2 consistent with previous EKG and rate of 60 bpm with no acute changes noted.  Risk Assessment/Calculations:          Lab Results  Component Value Date   WBC 6.9 10/14/2022   HGB 13.5 10/14/2022   HCT 41.3 10/14/2022   MCV 90.6  10/14/2022   PLT 251.0 10/14/2022   Lab Results  Component Value Date   CREATININE 1.10 04/03/2020   BUN 14 04/03/2020   NA 142 04/03/2020   K 4.2 04/03/2020   CL 104 04/03/2020   CO2 22 04/03/2020   No results found for: "CHOL", "HDL", "LDLCALC", "LDLDIRECT", "TRIG", "CHOLHDL"  Lab Results  Component Value Date   HGBA1C 8.5 (H) 02/21/2020   Assessment & Plan    1.  Shortness of breath/chest pain: -Patient reports substernal chest discomfort that is aggravated with lifting and is associated with heaviness and shortness of breath. -We will add Nitrostat 0.4 mg as needed and ED precautions were provided for patient regarding discomfort not relieved by nitroglycerin. -Patient will complete Lexiscan Myoview to evaluate for ischemia -We will also check 2D echo to evaluate for structural and valvular heart changes.  2.Coronary artery disease: -AD s/p CABG x2 with LIMA to the LAD and RSVG to OM 2 in 2020 -Patient endorses chest discomfort as noted above -Continue current GDMT with ASA 81 mg, Lipitor 80 mg daily, Plavix 75 mg daily, Toprol all 25 mg twice daily  3.Essential hypertension: -Patient's blood pressure today was well-controlled at 120/82 -Continue metoprolol 25 mg twice daily  4.Hyperlipidemia: -Patient's last LDL was was 66 at goal of less than 70 -Continue atorvastatin 80 mg daily  Disposition: Follow-up with Lance Muss, MD or APP in 3 months Informed Consent   Shared Decision Making/Informed Consent The risks [chest pain, shortness of breath, cardiac arrhythmias, dizziness, blood pressure fluctuations, myocardial infarction, stroke/transient ischemic attack, nausea, vomiting, allergic reaction, radiation exposure, metallic taste sensation and life-threatening complications (estimated to be 1 in 10,000)], benefits (risk stratification, diagnosing coronary artery disease, treatment guidance) and alternatives of a nuclear stress test were discussed in detail with  Tom Phelps and he agrees to proceed.      Signed, Napoleon Form, Leodis Rains, NP 10/24/2022, 2:43 PM [facility]

## 2022-10-24 NOTE — Patient Instructions (Signed)
Medication Instructions:  START Nitroglycerin 0.4mg  Take 1 as needed for emergency chest pain. Take first dose for emergency chest pain; WAIT 5 minutes and if still having chest pain CALL 911 and then take 2nd dose. IF still having pain wait an additional 5 minutes before taking final dose. Do not take more than 3 doses in a day. *If you need a refill on your cardiac medications before your next appointment, please call your pharmacy*   Lab Work: NONE ORDERED   Testing/Procedures: Your physician has requested that you have an echocardiogram. Echocardiography is a painless test that uses sound waves to create images of your heart. It provides your doctor with information about the size and shape of your heart and how well your heart's chambers and valves are working. This procedure takes approximately one hour. There are no restrictions for this procedure. Please do NOT wear cologne, perfume, aftershave, or lotions (deodorant is allowed). Please arrive 15 minutes prior to your appointment time.  Your physician has requested that you have a lexiscan myoview. For further information please visit [email or web address] Please follow instruction sheet, as given.   Follow-Up: At [facility], you and your health needs are our priority.  As part of our continuing mission to provide you with exceptional heart care, we have created designated Provider Care Teams.  These Care Teams include your primary Cardiologist (physician) and Advanced Practice Providers (APPs -  Physician Assistants and Nurse Practitioners) who all work together to provide you with the care you need, when you need it.  We recommend signing up for the patient portal called "MyChart".  Sign up information is provided on this After Visit Summary.  MyChart is used to connect with patients for Virtual Visits (Telemedicine).  Patients are able to view lab/test results, encounter notes, upcoming appointments, etc.  Non-urgent messages  can be sent to your provider as well.   To learn more about what you can do with MyChart, go to [email or web address].    Your next appointment:   3 month(s)  Provider:   Dr Rosemary Holms Dr Jacinto Halim Dr Odis Hollingshead Other Instructions

## 2022-10-30 NOTE — Progress Notes (Signed)
Agree with assessment/plan.  Raj Florestine Carmical, MD [facility] [phone]  

## 2022-10-31 ENCOUNTER — Ambulatory Visit: Payer: 59 | Admitting: Nurse Practitioner

## 2022-11-19 ENCOUNTER — Telehealth (HOSPITAL_COMMUNITY): Payer: Self-pay | Admitting: *Deleted

## 2022-11-19 NOTE — Telephone Encounter (Signed)
Patient given detailed instructions per Myocardial Perfusion Study Information Sheet for the test on 11/26/22 Patient notified to arrive 15 minutes early and that it is imperative to arrive on time for appointment to keep from having the test rescheduled.  If you need to cancel or reschedule your appointment, please call the office within 24 hours of your appointment. . Patient verbalized understanding.Tom Phelps

## 2022-11-26 ENCOUNTER — Ambulatory Visit (HOSPITAL_BASED_OUTPATIENT_CLINIC_OR_DEPARTMENT_OTHER): Payer: 59

## 2022-11-26 ENCOUNTER — Ambulatory Visit (HOSPITAL_COMMUNITY): Payer: 59 | Attending: Nurse Practitioner

## 2022-11-26 DIAGNOSIS — I25709 Atherosclerosis of coronary artery bypass graft(s), unspecified, with unspecified angina pectoris: Secondary | ICD-10-CM | POA: Diagnosis not present

## 2022-11-26 DIAGNOSIS — Z794 Long term (current) use of insulin: Secondary | ICD-10-CM

## 2022-11-26 DIAGNOSIS — E785 Hyperlipidemia, unspecified: Secondary | ICD-10-CM | POA: Diagnosis not present

## 2022-11-26 DIAGNOSIS — E1159 Type 2 diabetes mellitus with other circulatory complications: Secondary | ICD-10-CM

## 2022-11-26 DIAGNOSIS — R1013 Epigastric pain: Secondary | ICD-10-CM

## 2022-11-26 DIAGNOSIS — I10 Essential (primary) hypertension: Secondary | ICD-10-CM | POA: Insufficient documentation

## 2022-11-26 DIAGNOSIS — R0602 Shortness of breath: Secondary | ICD-10-CM | POA: Diagnosis not present

## 2022-11-26 LAB — MYOCARDIAL PERFUSION IMAGING
Estimated workload: 1
Exercise duration (min): 1 min
Exercise duration (sec): 1 s
LV dias vol: 70 mL (ref 62–150)
LV sys vol: 22 mL
MPHR: 156 {beats}/min
Nuc Stress EF: 68 %
Peak HR: 93 {beats}/min
Percent HR: 60 %
Rest HR: 62 {beats}/min
Rest Nuclear Isotope Dose: 10.2 mCi
SDS: 0
SRS: 0
SSS: 0
ST Depression (mm): 0 mm
Stress Nuclear Isotope Dose: 30.2 mCi
TID: 1.06

## 2022-11-26 LAB — ECHOCARDIOGRAM COMPLETE
Area-P 1/2: 2.5 cm2
Height: 70 in
S' Lateral: 2.5 cm
Weight: 2992 [oz_av]

## 2022-11-26 MED ORDER — TECHNETIUM TC 99M TETROFOSMIN IV KIT
30.2000 | PACK | Freq: Once | INTRAVENOUS | Status: AC | PRN
  Administered 2022-11-26: 30.2 via INTRAVENOUS

## 2022-11-26 MED ORDER — REGADENOSON 0.4 MG/5ML IV SOLN
0.4000 mg | Freq: Once | INTRAVENOUS | Status: AC
  Administered 2022-11-26: 0.4 mg via INTRAVENOUS

## 2022-11-26 MED ORDER — TECHNETIUM TC 99M TETROFOSMIN IV KIT
10.2000 | PACK | Freq: Once | INTRAVENOUS | Status: AC | PRN
  Administered 2022-11-26: 10.2 via INTRAVENOUS

## 2022-12-05 ENCOUNTER — Other Ambulatory Visit: Payer: Self-pay | Admitting: Nurse Practitioner

## 2022-12-08 ENCOUNTER — Telehealth: Payer: Self-pay | Admitting: Nurse Practitioner

## 2022-12-08 NOTE — Telephone Encounter (Signed)
*  STAT* If patient is at the pharmacy, call can be transferred to refill team.   1. Which medications need to be refilled? (please list name of each medication and dose if known)  metoprolol tartrate (LOPRESSOR) 25 MG tablet  2. Which pharmacy/location (including street and city if local pharmacy) is medication to be sent to? [facility] [address] [place], [place] - [address]  3. Do they need a 30 day or 90 day supply?   90 day supply

## 2022-12-09 ENCOUNTER — Other Ambulatory Visit: Payer: Self-pay | Admitting: Interventional Cardiology

## 2022-12-10 ENCOUNTER — Ambulatory Visit: Payer: Medicare Other | Admitting: Nurse Practitioner

## 2022-12-11 DIAGNOSIS — E119 Type 2 diabetes mellitus without complications: Secondary | ICD-10-CM | POA: Diagnosis not present

## 2022-12-11 DIAGNOSIS — R0602 Shortness of breath: Secondary | ICD-10-CM | POA: Diagnosis not present

## 2023-01-20 DIAGNOSIS — H5203 Hypermetropia, bilateral: Secondary | ICD-10-CM | POA: Diagnosis not present

## 2023-01-20 DIAGNOSIS — E119 Type 2 diabetes mellitus without complications: Secondary | ICD-10-CM | POA: Diagnosis not present

## 2023-01-20 DIAGNOSIS — H52223 Regular astigmatism, bilateral: Secondary | ICD-10-CM | POA: Diagnosis not present

## 2023-01-20 DIAGNOSIS — H2513 Age-related nuclear cataract, bilateral: Secondary | ICD-10-CM | POA: Diagnosis not present

## 2023-01-20 DIAGNOSIS — H524 Presbyopia: Secondary | ICD-10-CM | POA: Diagnosis not present

## 2023-01-22 ENCOUNTER — Other Ambulatory Visit: Payer: Self-pay

## 2023-01-22 ENCOUNTER — Encounter (HOSPITAL_COMMUNITY): Payer: Self-pay

## 2023-01-22 ENCOUNTER — Emergency Department (HOSPITAL_COMMUNITY)
Admission: EM | Admit: 2023-01-22 | Discharge: 2023-01-22 | Discharge status: Against Medical Advice | Payer: 59 | Attending: Emergency Medicine | Admitting: Emergency Medicine

## 2023-01-22 ENCOUNTER — Emergency Department (HOSPITAL_COMMUNITY): Payer: 59

## 2023-01-22 DIAGNOSIS — I2510 Atherosclerotic heart disease of native coronary artery without angina pectoris: Secondary | ICD-10-CM | POA: Insufficient documentation

## 2023-01-22 DIAGNOSIS — Z951 Presence of aortocoronary bypass graft: Secondary | ICD-10-CM | POA: Diagnosis not present

## 2023-01-22 DIAGNOSIS — R079 Chest pain, unspecified: Secondary | ICD-10-CM | POA: Diagnosis not present

## 2023-01-22 DIAGNOSIS — Z5321 Procedure and treatment not carried out due to patient leaving prior to being seen by health care provider: Secondary | ICD-10-CM | POA: Diagnosis not present

## 2023-01-22 DIAGNOSIS — R1013 Epigastric pain: Secondary | ICD-10-CM | POA: Insufficient documentation

## 2023-01-22 DIAGNOSIS — R0789 Other chest pain: Secondary | ICD-10-CM | POA: Diagnosis not present

## 2023-01-22 LAB — BASIC METABOLIC PANEL
Anion gap: 11 (ref 5–15)
BUN: 19 mg/dL (ref 8–23)
CO2: 19 mmol/L — ABNORMAL LOW (ref 22–32)
Calcium: 9.5 mg/dL (ref 8.9–10.3)
Chloride: 102 mmol/L (ref 98–111)
Creatinine, Ser: 1.16 mg/dL (ref 0.61–1.24)
GFR, Estimated: >60 mL/min (ref >60)
Glucose, Bld: 216 mg/dL — ABNORMAL HIGH (ref 70–99)
Potassium: 3.2 mmol/L — ABNORMAL LOW (ref 3.5–5.1)
Sodium: 132 mmol/L — ABNORMAL LOW (ref 135–145)

## 2023-01-22 LAB — HEPATIC FUNCTION PANEL
ALT: 14 U/L (ref 0–44)
AST: 27 U/L (ref 15–41)
Albumin: 4.3 g/dL (ref 3.5–5.0)
Alkaline Phosphatase: 90 U/L (ref 38–126)
Bilirubin, Direct: 0.2 mg/dL (ref 0.0–0.2)
Indirect Bilirubin: 1.2 mg/dL — ABNORMAL HIGH (ref 0.3–0.9)
Total Bilirubin: 1.4 mg/dL — ABNORMAL HIGH (ref 0.0–1.2)
Total Protein: 7.5 g/dL (ref 6.5–8.1)

## 2023-01-22 LAB — CBC
HCT: 49.1 % (ref 39.0–52.0)
Hemoglobin: 16.1 g/dL (ref 13.0–17.0)
MCH: 29.4 pg (ref 26.0–34.0)
MCHC: 32.8 g/dL (ref 30.0–36.0)
MCV: 89.6 fL (ref 80.0–100.0)
Platelets: 253 10*3/uL (ref 150–400)
RBC: 5.48 MIL/uL (ref 4.22–5.81)
RDW: 14.9 % (ref 11.5–15.5)
WBC: 6.8 10*3/uL (ref 4.0–10.5)
nRBC: 0 % (ref 0.0–0.2)

## 2023-01-22 LAB — LIPASE, BLOOD: Lipase: 31 U/L (ref 11–51)

## 2023-01-22 LAB — TROPONIN I (HIGH SENSITIVITY): Troponin I (High Sensitivity): 3 ng/L (ref <18)

## 2023-01-22 MED ORDER — ASPIRIN 81 MG PO TBEC
243.0000 mg | DELAYED_RELEASE_TABLET | Freq: Once | ORAL | Status: AC
Start: 2023-01-22 — End: 2023-01-22
  Administered 2023-01-22: 243 mg via ORAL
  Filled 2023-01-22: qty 3

## 2023-01-22 NOTE — ED Provider Triage Note (Signed)
Emergency Medicine Provider Triage Evaluation Note  Tom Phelps , a 63 y.o. male  was evaluated in triage.  Pt complains of epigastric abdominal pain that started just prior to arrival.  Does have significant history of CAD and is status post CABG.  Pain does not radiate.  No associated diaphoresis or nausea.  Does endorse some dyspnea.  He took 1 baby aspirin, 1 nitroglycerin without any significant improvement.  Will give 3 additional 81 mg aspirins.  Review of Systems  Positive: As above Negative: As above  Physical Exam  BP (!) 159/92   Pulse 76   Temp (!) 97.5 F (36.4 C)   Resp 19   Ht 5\' 10"  (1.778 m)   Wt 84 kg   SpO2 92%   BMI 26.57 kg/m  Gen:   Awake, no distress   Resp:  Normal effort  MSK:   Moves extremities without difficulty  Other:    Medical Decision Making  Medically screening exam initiated at 8:51 AM.  Appropriate orders placed.  Tom Phelps was informed that the remainder of the evaluation will be completed by another provider, this initial triage assessment does not replace that evaluation, and the importance of remaining in the ED until their evaluation is complete.    Marita Kansas, PA-C 01/22/23 [phone]

## 2023-01-22 NOTE — ED Triage Notes (Addendum)
C/o epigastric pain radiating into chest with sob that started 20 mins PTA. Patient had 1 nitroglycerin at home w/o relief. Sob has x 1 month.  Patient is diaphoretic with labored respirations on arrival  Pain described as pressure.

## 2023-01-23 DIAGNOSIS — R079 Chest pain, unspecified: Secondary | ICD-10-CM | POA: Diagnosis not present

## 2023-01-28 ENCOUNTER — Ambulatory Visit: Payer: 59 | Attending: Cardiology | Admitting: Cardiology

## 2023-01-28 ENCOUNTER — Encounter: Payer: Self-pay | Admitting: Cardiology

## 2023-01-28 VITALS — BP 116/66 | HR 64 | Resp 16 | Ht 70.0 in | Wt 189.4 lb

## 2023-01-28 DIAGNOSIS — I25810 Atherosclerosis of coronary artery bypass graft(s) without angina pectoris: Secondary | ICD-10-CM

## 2023-01-28 DIAGNOSIS — E1159 Type 2 diabetes mellitus with other circulatory complications: Secondary | ICD-10-CM

## 2023-01-28 DIAGNOSIS — I10 Essential (primary) hypertension: Secondary | ICD-10-CM

## 2023-01-28 DIAGNOSIS — E782 Mixed hyperlipidemia: Secondary | ICD-10-CM | POA: Diagnosis not present

## 2023-01-28 DIAGNOSIS — Z951 Presence of aortocoronary bypass graft: Secondary | ICD-10-CM | POA: Diagnosis not present

## 2023-01-28 DIAGNOSIS — R1013 Epigastric pain: Secondary | ICD-10-CM | POA: Diagnosis not present

## 2023-01-28 DIAGNOSIS — Z794 Long term (current) use of insulin: Secondary | ICD-10-CM | POA: Diagnosis not present

## 2023-01-28 DIAGNOSIS — Z8673 Personal history of transient ischemic attack (TIA), and cerebral infarction without residual deficits: Secondary | ICD-10-CM | POA: Diagnosis not present

## 2023-01-28 NOTE — Patient Instructions (Signed)
Medication Instructions:  None  *If you need a refill on your cardiac medications before your next appointment, please call your pharmacy*   Lab Work: None  If you have labs (blood work) drawn today and your tests are completely normal, you will receive your results only by: MyChart Message (if you have MyChart) OR A paper copy in the mail If you have any lab test that is abnormal or we need to change your treatment, we will call you to review the results.   Testing/Procedures: None    Follow-Up: At [facility], you and your health needs are our priority.  As part of our continuing mission to provide you with exceptional heart care, we have created designated Provider Care Teams.  These Care Teams include your primary Cardiologist (physician) and Advanced Practice Providers (APPs -  Physician Assistants and Nurse Practitioners) who all work together to provide you with the care you need, when you need it.  We recommend signing up for the patient portal called "MyChart".  Sign up information is provided on this After Visit Summary.  MyChart is used to connect with patients for Virtual Visits (Telemedicine).  Patients are able to view lab/test results, encounter notes, upcoming appointments, etc.  Non-urgent messages can be sent to your provider as well.   To learn more about what you can do with MyChart, go to [email or web address].    Your next appointment:   6 month(s)  Provider:   Tessa Lerner, DO     [place]

## 2023-01-28 NOTE — Progress Notes (Signed)
Cardiology Office Note:  .   Date:  01/28/2023  ID:  Tom Phelps, DOB [birth year], MRN [id number] PCP:  Mila Palmer, MD  Former Cardiology Providers: Dr. Lance Muss [facility] Providers Cardiologist:  Tessa Lerner, DO , [facility] (established care 01/28/23) Electrophysiologist:  None  Click to update primary MD,subspecialty MD or APP then REFRESH:1}    Chief Complaint  Patient presents with   Shortness of Breath   Follow-up    History of Present Illness: .   Tom Phelps is a 63 y.o. [place] descent male whose past medical history and cardiovascular risk factors includes: CAD s/p CABG x2 with LIMA to the LAD and RSVG to OM 2., DM type II, HLD, anxiety, TIA 2018.   Formally under the care of Dr. Lance Muss who last saw Tom Phelps back in January 2023. I am seeing him for the first time to re-establishing care.   In June 2020 patient was experiencing exertional chest pain and shortness of breath.  He underwent coronary CTA which noted obstructive disease as per CT FFR underwent angiography.  He was noted to have severe two-vessel diffuse disease was referred to CABG.  He underwent CABG x 2 in July 2020 with LIMA to the LAD and reverse SVG to OM2.  The last office visit with APP in October 2024 patient was endorsing precordial chest pain concerning for cardiac symptoms.  He was recommended to undergo Lexiscan Myoview as well as 2D echocardiogram.  Results of these reviewed with him in detail and noted below for further reference.  Since last office visit, denies anginal chest pain or heart failure symptoms.  At times when he bends forward patient complains of discomfort at the xiphoid process which makes him difficult to breathe.  He had similar discomfort at the last office visit during which time echo and stress test were ordered.  Stress test independently reviewed today which notes to be low risk study.  Patient states that the discomfort still persist despite having a  cholecystectomy.  No unifying diagnosis at this time.  He has tried sublingual nitroglycerin tablets during the discomfort but no significant improvement.  He currently uses ibuprofen 800 mg p.o. 3 times daily and as needed basis, on average he utilizes 2 pills over a week.   Patient went to the emergency room department for similar discomfort on 01/22/2023.  Chest x-ray independently reviewed, high sensitive troponin negative x 1, inpatient left AMA due to prolonged wait time.  Patient is scheduled to see pulmonary medicine in February 2025.  And still has not been evaluated by GI.  Review of Systems: .   Review of Systems  Cardiovascular:  Positive for chest pain (At the xiphoid process.). Negative for claudication, irregular heartbeat, leg swelling, near-syncope, orthopnea, palpitations, paroxysmal nocturnal dyspnea and syncope.  Respiratory:  Negative for shortness of breath.   Hematologic/Lymphatic: Negative for bleeding problem.    Studies Reviewed:   EKG: EKG Interpretation Date/Time:  Wednesday January 28 2023 11:20:58 EST Ventricular Rate:  73 PR Interval:  150 QRS Duration:  84 QT Interval:  394 QTC Calculation: 434 R Axis:   -13  Text Interpretation: Normal sinus rhythm Nonspecific T wave abnormality When compared with ECG of 22-Jan-2023 08:09, No significant change since last tracing Confirmed by Tessa Lerner [phone]) on 01/28/2023 11:35:29 AM  Echocardiogram: 11/26/2022 60 to 65%, normal regional wall motion abnormalities, mild LVH. Normal diastolic function. Right ventricular size and function normal. Trivial MR Estimated RAP 3 mmHg See report for additional  details  Stress Testing: MYOCARDIAL PERFUSION IMAGING 11/26/2022   Narrative   Findings are consistent with no ischemia. The study is low risk.   No ST deviation was noted.   Left ventricular function is normal. Nuclear stress EF: 68%. The left ventricular ejection fraction is hyperdynamic (>65%). End diastolic  cavity size is normal. End systolic cavity size is normal.   No ischemia. LVEF 68% with normal wall motion. Mild RV tracer uptake noted, suggestive of possible pulmonary hypertension. This is a low risk study. No prior for comparison.  RADIOLOGY: N/A  Risk Assessment/Calculations:   N/A   Labs:       Latest Ref Rng & Units 01/22/2023    8:14 AM 10/14/2022    2:52 PM 05/14/2020   10:15 AM  CBC  WBC 4.0 - 10.5 K/uL 6.8  6.9  5.6   Hemoglobin 13.0 - 17.0 g/dL 64.4  03.4  74.2   Hematocrit 39.0 - 52.0 % 49.1  41.3  39.3   Platelets 150 - 400 K/uL 253  251.0  252        Latest Ref Rng & Units 01/22/2023    8:14 AM 04/03/2020    2:18 PM 02/21/2020    1:53 PM  BMP  Glucose 70 - 99 mg/dL 595  638  756   BUN 8 - 23 mg/dL 19  14  25    Creatinine 0.61 - 1.24 mg/dL 4.33  2.95  1.88   BUN/Creat Ratio 10 - 24  13    Sodium 135 - 145 mmol/L 132  142  136   Potassium 3.5 - 5.1 mmol/L 3.2  4.2  4.8   Chloride 98 - 111 mmol/L 102  104  102   CO2 22 - 32 mmol/L 19  22  24    Calcium 8.9 - 10.3 mg/dL 9.5  9.5  9.7       Latest Ref Rng & Units 01/22/2023    8:14 AM 10/14/2022    2:52 PM 04/03/2020    2:18 PM  CMP  Glucose 70 - 99 mg/dL 416   606   BUN 8 - 23 mg/dL 19   14   Creatinine 3.01 - 1.24 mg/dL 6.01   0.93   Sodium 235 - 145 mmol/L 132   142   Potassium 3.5 - 5.1 mmol/L 3.2   4.2   Chloride 98 - 111 mmol/L 102   104   CO2 22 - 32 mmol/L 19   22   Calcium 8.9 - 10.3 mg/dL 9.5   9.5   Total Protein 6.5 - 8.1 g/dL 7.5  6.5    Total Bilirubin 0.0 - 1.2 mg/dL 1.4  0.8    Alkaline Phos 38 - 126 U/L 90  93    AST 15 - 41 U/L 27  16    ALT 0 - 44 U/L 14  8      No results found for: "CHOL", "HDL", "LDLCALC", "LDLDIRECT", "TRIG", "CHOLHDL" No results for input(s): "LIPOA" in the last 8760 hours. No components found for: "NTPROBNP" No results for input(s): "PROBNP" in the last 8760 hours. No results for input(s): "TSH" in the last 8760 hours.  External Labs: Collected: 09/26/2022  [facility] database. Total cholesterol 125, triglycerides 81, HDL 43, LDL calculated 66. TSH 1.42.  Collected 12/11/2022: A1c 7.0  Physical Exam:    Today's Vitals   01/28/23 1118  BP: 116/66  Pulse: 64  Resp: 16  SpO2: 97%  Weight: 189 lb 6.4 oz (85.9  kg)  Height: 5\' 10"  (1.778 m)   Body mass index is 27.18 kg/m. Wt Readings from Last 3 Encounters:  01/28/23 189 lb 6.4 oz (85.9 kg)  01/22/23 185 lb 3 oz (84 kg)  11/26/22 187 lb (84.8 kg)    Physical Exam  Constitutional: No distress.  hemodynamically stable  Neck: No JVD present.  Cardiovascular: Normal rate, regular rhythm, S1 normal and S2 normal. Exam reveals no gallop, no S3 and no S4.  No murmur heard. Pulmonary/Chest: Effort normal and breath sounds normal. No stridor. He has no wheezes. He has no rales.  Sternotomy site is well-healed, no tenderness to palpation at the xiphoid process, no rashes or wounds noted.  Abdominal: Soft. Bowel sounds are normal. He exhibits no distension. There is no abdominal tenderness.  Musculoskeletal:        General: No edema.     Cervical back: Neck supple.  Neurological: He is alert and oriented to person, place, and time. He has intact cranial nerves (2-12).  Skin: Skin is warm.     Impression & Recommendation(s):  Impression:   ICD-10-CM   1. Coronary artery disease involving coronary bypass graft of native heart with angina pectoris (HCC)  I25.709 EKG 12-Lead    2. S/P CABG x 2  Z95.1     3. Epigastric pain  R10.13     4. Essential hypertension  I10     5. Mixed hyperlipidemia  E78.2     6. Type 2 diabetes mellitus with other circulatory complication, with long-term current use of insulin (HCC)  E11.59    Z79.4     7. History of TIA (transient ischemic attack)  Z86.73        Recommendation(s):  Coronary artery disease involving coronary bypass graft of native heart without angina pectoris (HCC) S/P CABG x 2 Denies anginal chest pain but does endorse epigastric  discomfort and difficulty breathing when bending forward Given his history did undergo echo and MPI, results reviewed. MPI results independently reviewed with the patient including images and no obvious reversible ischemia. Continue aspirin 81 mg p.o. daily. Continue atorvastatin 80 mg p.o. nightly and Zetia 10 mg p.o. daily. Chronic cardiovascular standpoint he is relatively stable and has undergone appropriate ischemic workup.  The discomfort that he is having likely secondary to noncardiac causes and have advised him to follow-up with pulmonary medicine as well as GI evaluation. If pulmonary workup as well as GI workup is essentially negative and the pain continues could consider angiography. However overall probability of obstructive disease is low as a recent MPI is low risk and patient symptoms are not anginal equivalent.  Patient is agreeable with the plan of care.  Epigastric pain Discomfort is not suggestive of anginal equivalents More pronounced when bending forward and associated with difficulty breathing. Has an appointment with pulmonary medicine in February 2025. Have asked him to reduce consumption of NSAIDs, currently takes ibuprofen 800 mg p.o. as needed. Consider Tylenol as an alternative. Recommend following up with gastroenterology to rule out heartburn/esophagitis/H. pylori, etc.  Essential hypertension Office blood pressures are well-controlled. Currently on Lopressor 25 mg p.o. twice daily Reemphasized importance of low-salt diet.  Mixed hyperlipidemia Currently on atorvastatin 80 mg p.o. daily and Zetia 10 mg p.o. daily.   He denies myalgia or other side effects. Most recent lipids dated September 2024, independently reviewed as noted above.  LDL is 66 mg/dL. Recommend a goal LDL <55 mg/dL Cardiology is following peripherally.   Type 2 diabetes mellitus with  other circulatory complication, with long-term current use of insulin (HCC) A1c 7.0 as of December  2024 Reemphasized importance of glycemic control. Currently on Jardiance, Lipitor Consider low-dose ACE inhibitor/ARB for renal protection, defer to PCP.  History of TIA (transient ischemic attack) Currently on antiplatelet therapy and lipid-lowering therapy.   Orders Placed:  Orders Placed This Encounter  Procedures   EKG 12-Lead   Total time spent: 45 minutes.  As part of today's office visit and reestablishing care I reviewed the following documentations as part of today's encounter: Note from Dr. Lance Muss dated 01/23/2021 Echo results from November 2024 Nuclear stress test results including images from November 2024.  Chest x-ray from 01/22/2023. Labs from 01/22/2023.  Labs from [facility] database from September 2024 as well as December 2024 EKG ordered and independently reviewed today. Coordination of care as well as discussing the differential diagnosis of his current symptoms.  Final Medication List:   No orders of the defined types were placed in this encounter.   Medications Discontinued During This Encounter  Medication Reason   metoprolol tartrate (LOPRESSOR) 25 MG tablet Duplicate   metoprolol tartrate (LOPRESSOR) 25 MG tablet Duplicate     Current Outpatient Medications:    aspirin EC 81 MG tablet, Take 81 mg by mouth daily., Disp: , Rfl:    atorvastatin (LIPITOR) 80 MG tablet, Take 1 tablet (80 mg total) by mouth daily., Disp: 90 tablet, Rfl: 1   clopidogrel (PLAVIX) 75 MG tablet, Take 75 mg by mouth daily., Disp: , Rfl:    empagliflozin (JARDIANCE) 10 MG TABS tablet, Take 1 tablet by mouth daily., Disp: , Rfl:    ibuprofen (ADVIL) 800 MG tablet, Take 800 mg by mouth 3 (three) times daily as needed for mild pain or moderate pain. , Disp: , Rfl:    metoprolol tartrate (LOPRESSOR) 25 MG tablet, Take 1 tablet by mouth twice daily, Disp: 180 tablet, Rfl: 3   nitroGLYCERIN (NITROSTAT) 0.4 MG SL tablet, Place 1 tablet (0.4 mg total) under the tongue every 5 (five)  minutes as needed., Disp: 25 tablet, Rfl: 3  Consent:   NA  Disposition:   6 months to eval evaluate symptoms.  His questions and concerns were addressed to his satisfaction. He voices understanding of the recommendations provided during this encounter.    Signed, Tessa Lerner, DO, [facility]  [facility]  [address] [place], [place] [address] 01/28/2023 12:13 PM

## 2023-02-18 ENCOUNTER — Ambulatory Visit: Payer: 59 | Admitting: Pulmonary Disease

## 2023-02-18 ENCOUNTER — Encounter: Payer: Self-pay | Admitting: Pulmonary Disease

## 2023-02-18 VITALS — BP 110/70 | HR 57 | Ht 70.0 in | Wt 189.4 lb

## 2023-02-18 DIAGNOSIS — R0789 Other chest pain: Secondary | ICD-10-CM | POA: Diagnosis not present

## 2023-02-18 NOTE — Patient Instructions (Signed)
Schedule for PFT  CT scan of the chest without contrast  You should see a GI doctor for evaluation  I will see you back in about 6 weeks, the breathing study can be done on the same day  I do not think you have a primary underlying lung disease

## 2023-02-18 NOTE — Progress Notes (Signed)
Tom Phelps    [id number]    [birth year]  Primary Care Physician:Wolters, Jasmine December, MD  Referring Physician: Mila Palmer, MD [address] [place],  [place] [address]  Chief complaint:   Patient being seen for chest discomfort  HPI:  Patient has epigastric pain, most prominent around his xiphisternum Takes his breath away sometimes when he bends over  Has made multiple urgent care/ED visits for pain discomfort and shortness of breath relating to this  Does not have underlying lung disease Never smoker  History of coronary artery disease for which he had CABG about 4 years ago  Cardiac evaluation recently did not reveal anything untoward, pain/discomfort does not sound angina like  He does have some symptoms of dyspepsia, fullness, early satiety sometimes  He does stay active, works part-time He walks about 1/2 to 2 miles on a regular basis when he is not working and during work hours he states he walks up to 3 to 4 miles during his work shift -Does not get short of breath with these activities  He does take Advil, Tylenol for the pain  Outpatient Encounter Medications as of 02/18/2023  Medication Sig   aspirin EC 81 MG tablet Take 81 mg by mouth daily.   atorvastatin (LIPITOR) 80 MG tablet Take 1 tablet (80 mg total) by mouth daily.   clopidogrel (PLAVIX) 75 MG tablet Take 75 mg by mouth daily.   empagliflozin (JARDIANCE) 10 MG TABS tablet Take 1 tablet by mouth daily.   ibuprofen (ADVIL) 800 MG tablet Take 800 mg by mouth 3 (three) times daily as needed for mild pain or moderate pain.    metoprolol tartrate (LOPRESSOR) 25 MG tablet Take 1 tablet by mouth twice daily   nitroGLYCERIN (NITROSTAT) 0.4 MG SL tablet Place 1 tablet (0.4 mg total) under the tongue every 5 (five) minutes as needed.   No facility-administered encounter medications on file as of 02/18/2023.    Allergies as of 02/18/2023 - Review Complete 02/18/2023  Allergen Reaction Noted    Metformin and related Other (See Comments) and Shortness Of Breath 07/13/2018   Metformin hcl  01/31/2020   Codeine Itching 07/03/2021   Oxycodone Itching 06/07/2018   Oxycodone hcl Itching 01/31/2020    Past Medical History:  Diagnosis Date   Actinic keratoses    Ankle dislocation    calcaneal tendons   Anxiety    Aortic atherosclerosis (HCC)    Chronic pain of left knee 11/18/2017   Coronary artery disease    DDD (degenerative disc disease), cervical    Diabetes mellitus without complication (HCC) 2019   no meds   Displaced other extraarticular fracture of right calcaneus, initial encounter for closed fracture 07/13/2017   DOE (dyspnea on exertion)    Dyspnea    Fatty liver    mild   Fracture of distal end of right fibula    Gallstones    History of kidney stones    Hypercholesteremia    Hypercholesterolemia    Hypertension    Lightheadedness    Neck pain    due to work    Neuromuscular disorder (HCC)    carpal tunnel bi lat   Prediabetes    Pulmonary nodules    Stress reaction    Stroke (HCC)    2018 no residuals   TIA (transient ischemic attack)    circulation     Past Surgical History:  Procedure Laterality Date   BACK SURGERY  2005  L4-L5 fusion   CHOLECYSTECTOMY N/A 03/02/2020   Procedure: LAPAROSCOPIC CHOLECYSTECTOMY;  Surgeon: Andria Meuse, MD;  Location: [facility] ORS;  Service: General;  Laterality: N/A;   CORONARY ARTERY BYPASS GRAFT N/A 07/29/2018   Procedure: CORONARY ARTERY BYPASS GRAFTING (CABG) x 2;  Surgeon: Corliss Skains, MD;  Location: [facility] OR;  Service: Open Heart Surgery;  Laterality: N/A;   CORONARY PRESSURE/FFR STUDY N/A 07/20/2018   Procedure: INTRAVASCULAR PRESSURE WIRE/FFR STUDY;  Surgeon: Corky Crafts, MD;  Location: [facility] INVASIVE CV LAB;  Service: Cardiovascular;  Laterality: N/A;   ENDOVEIN HARVEST OF GREATER SAPHENOUS VEIN Left 07/29/2018   Procedure: Endovein Harvest Of Greater Saphenous Vein Left Thigh; Exploration only of  right leg greater saphenous vein;  Surgeon: Corliss Skains, MD;  Location: [facility] OR;  Service: Open Heart Surgery;  Laterality: Left;   LEFT HEART CATH AND CORONARY ANGIOGRAPHY N/A 07/20/2018   Procedure: LEFT HEART CATH AND CORONARY ANGIOGRAPHY;  Surgeon: Corky Crafts, MD;  Location: [facility] INVASIVE CV LAB;  Service: Cardiovascular;  Laterality: N/A;   TEE WITHOUT CARDIOVERSION N/A 07/29/2018   Procedure: TRANSESOPHAGEAL ECHOCARDIOGRAM (TEE);  Surgeon: Corliss Skains, MD;  Location[facility] OR;  Service: Open Heart Surgery;  Laterality: N/A;    Family History  Problem Relation Age of Onset   CAD Mother    CAD Father     Social History   Socioeconomic History   Marital status: Married    Spouse name: Not on file   Number of children: 2   Years of education: Not on file   Highest education level: Not on file  Occupational History   Not on file  Tobacco Use   Smoking status: Never   Smokeless tobacco: Never  Vaping Use   Vaping status: Never Used  Substance and Sexual Activity   Alcohol use: Never   Drug use: Never   Sexual activity: Yes    Comment: married Estate agent)  Other Topics Concern   Not on file  Social History Narrative   Right handed   Drinks caffeine   Two story home   Social Drivers of Health   Financial Resource Strain: Not on file  Food Insecurity: Not on file  Transportation Needs: Not on file  Physical Activity: Not on file  Stress: Not on file  Social Connections: Not on file  Intimate Partner Violence: Not on file    Review of Systems  Respiratory:  Positive for shortness of breath.        Xiphisternum pain    Vitals:   02/18/23 1424  BP: 110/70  Pulse: (!) 57  SpO2: 98%     Physical Exam Constitutional:      Appearance: Normal appearance.  HENT:     Head: Normocephalic.     Mouth/Throat:     Mouth: Mucous membranes are moist.  Eyes:     General: No scleral icterus. Cardiovascular:     Rate and Rhythm: Normal rate.     Heart  sounds: No murmur heard.    No friction rub.  Pulmonary:     Effort: No respiratory distress.     Breath sounds: No stridor. No wheezing or rhonchi.     Comments: No chest tenderness No tenderness around the xiphisternum Musculoskeletal:     Cervical back: No rigidity or tenderness.  Neurological:     Mental Status: He is alert.  Psychiatric:        Mood and Affect: Mood normal.     Data Reviewed:  Spirometry 4 years ago 07/28/2018 was within normal limits  CT scan of the chest 04/10/2017 reviewed showing some nodules-likely granulomatous disease-actual film not available for review  Assessment:  Xiphisternum pain  Chest discomfort without associated shortness of breath  Has no underlying lung disease, never smoker Able to tolerate moderate exertion  Likely musculoskeletal pain, possibility of neuropathic pain  Plan/Recommendations: Will schedule the patient for CT scan of the chest to rule out significant pathology  Schedule for pulmonary function test  GI evaluation  I believe if workup is negative, trial with medications like Neurontin, Lyrica, Cymbalta may be options   Virl Diamond MD [facility] 02/18/2023, 2:41 PM  CC: Mila Palmer, MD

## 2023-03-13 ENCOUNTER — Other Ambulatory Visit: Payer: Self-pay | Admitting: Interventional Cardiology

## 2023-03-16 ENCOUNTER — Ambulatory Visit
Admission: RE | Admit: 2023-03-16 | Discharge: 2023-03-16 | Disposition: A | Discharge status: Home / Self Care | Payer: 59 | Source: Ambulatory Visit | Attending: Pulmonary Disease

## 2023-03-16 DIAGNOSIS — R0789 Other chest pain: Secondary | ICD-10-CM

## 2023-03-26 LAB — LAB REPORT - SCANNED
A1c: 6.9
EGFR: 77

## 2023-03-30 ENCOUNTER — Encounter: Payer: Self-pay | Admitting: Pulmonary Disease

## 2023-03-30 ENCOUNTER — Ambulatory Visit (HOSPITAL_BASED_OUTPATIENT_CLINIC_OR_DEPARTMENT_OTHER): Payer: 59 | Admitting: Pulmonary Disease

## 2023-03-30 ENCOUNTER — Ambulatory Visit: Payer: 59 | Admitting: Pulmonary Disease

## 2023-03-30 VITALS — BP 148/86 | HR 62 | Ht 69.0 in | Wt 193.4 lb

## 2023-03-30 DIAGNOSIS — R0789 Other chest pain: Secondary | ICD-10-CM

## 2023-03-30 LAB — PULMONARY FUNCTION TEST
DL/VA % pred: 85 %
DL/VA: 3.53 ml/min/mmHg/L
DLCO cor % pred: 95 %
DLCO cor: 24.66 ml/min/mmHg
DLCO unc % pred: 95 %
DLCO unc: 24.66 ml/min/mmHg
FEF 25-75 Post: 3.52 L/s
FEF 25-75 Pre: 3.31 L/s
FEF2575-%Change-Post: 6 %
FEF2575-%Pred-Post: 133 %
FEF2575-%Pred-Pre: 125 %
FEV1-%Change-Post: 2 %
FEV1-%Pred-Post: 118 %
FEV1-%Pred-Pre: 116 %
FEV1-Post: 3.93 L
FEV1-Pre: 3.85 L
FEV1FVC-%Change-Post: 2 %
FEV1FVC-%Pred-Pre: 101 %
FEV6-%Change-Post: 0 %
FEV6-%Pred-Post: 120 %
FEV6-%Pred-Pre: 120 %
FEV6-Post: 5.07 L
FEV6-Pre: 5.07 L
FEV6FVC-%Change-Post: 0 %
FEV6FVC-%Pred-Post: 105 %
FEV6FVC-%Pred-Pre: 104 %
FVC-%Change-Post: 0 %
FVC-%Pred-Post: 114 %
FVC-%Pred-Pre: 114 %
FVC-Post: 5.09 L
FVC-Pre: 5.1 L
Post FEV1/FVC ratio: 77 %
Post FEV6/FVC ratio: 100 %
Pre FEV1/FVC ratio: 76 %
Pre FEV6/FVC Ratio: 99 %
RV % pred: 96 %
RV: 2.21 L
TLC % pred: 110 %
TLC: 7.5 L

## 2023-03-30 MED ORDER — DULOXETINE HCL 30 MG PO CPEP: 30.0000 mg | ORAL_CAPSULE | Freq: Every day | ORAL | 3 refills | Status: DC

## 2023-03-30 NOTE — Patient Instructions (Signed)
 Trial with Cymbalta for intermittent chest pains -This will help with neuropathic pain  Your echocardiogram is within normal limits  Your breathing study is within normal limits  We are starting the Cymbalta at the lowest dose, dose can be increased if you see some benefit but not optimal enough  Stop the medication if you feel it is causing any side effects

## 2023-03-30 NOTE — Progress Notes (Signed)
 Tom Phelps    [id number]    1960-09-16  Primary Care Physician:Wolters, Jasmine December, MD  Referring Physician: Mila Palmer, MD [address] [place],  [place] [address]  Chief complaint:   Patient being seen for chest discomfort  HPI:  Continues to have chest discomfort  Pain just been 80s xiphisternum Unrelated to his meals Takes his breath away  In the past has made multiple ED visits  Stated in the last week has had at least 3 episodes  Has no underlying lung disease, no underlying heart disease, never smoker History of coronary artery disease for which he had CABG about 4 years ago  Cardiac evaluation recently did not reveal anything untoward, pain/discomfort does not sound angina like  He does have some symptoms of dyspepsia, fullness, early satiety sometimes  He does stay active, works part-time He walks about 1/2 to 2 miles on a regular basis when he is not working and during work hours he states he walks up to 3 to 4 miles during his work shift -Does not get short of breath with these activities  He does take Advil, Tylenol for the pain  Outpatient Encounter Medications as of 03/30/2023  Medication Sig   aspirin EC 81 MG tablet Take 81 mg by mouth daily.   atorvastatin (LIPITOR) 80 MG tablet Take 1 tablet by mouth once daily   clopidogrel (PLAVIX) 75 MG tablet Take 75 mg by mouth daily.   empagliflozin (JARDIANCE) 10 MG TABS tablet Take 1 tablet by mouth daily.   ibuprofen (ADVIL) 800 MG tablet Take 800 mg by mouth 3 (three) times daily as needed for mild pain or moderate pain.    metoprolol tartrate (LOPRESSOR) 25 MG tablet Take 1 tablet by mouth twice daily   nitroGLYCERIN (NITROSTAT) 0.4 MG SL tablet Place 1 tablet (0.4 mg total) under the tongue every 5 (five) minutes as needed.   No facility-administered encounter medications on file as of 03/30/2023.    Allergies as of 03/30/2023 - Review Complete 03/30/2023  Allergen Reaction Noted    Metformin and related Other (See Comments) and Shortness Of Breath 07/13/2018   Metformin hcl  01/31/2020   Codeine Itching 07/03/2021   Oxycodone Itching 06/07/2018   Oxycodone hcl Itching 01/31/2020    Past Medical History:  Diagnosis Date   Actinic keratoses    Ankle dislocation    calcaneal tendons   Anxiety    Aortic atherosclerosis (HCC)    Chronic pain of left knee 11/18/2017   Coronary artery disease    DDD (degenerative disc disease), cervical    Diabetes mellitus without complication (HCC) 2019   no meds   Displaced other extraarticular fracture of right calcaneus, initial encounter for closed fracture 07/13/2017   DOE (dyspnea on exertion)    Dyspnea    Fatty liver    mild   Fracture of distal end of right fibula    Gallstones    History of kidney stones    Hypercholesteremia    Hypercholesterolemia    Hypertension    Lightheadedness    Neck pain    due to work    Neuromuscular disorder (HCC)    carpal tunnel bi lat   Prediabetes    Pulmonary nodules    Stress reaction    Stroke (HCC)    2018 no residuals   TIA (transient ischemic attack)    circulation     Past Surgical History:  Procedure Laterality Date  BACK SURGERY  2005   L4-L5 fusion   CHOLECYSTECTOMY N/A 03/02/2020   Procedure: LAPAROSCOPIC CHOLECYSTECTOMY;  Surgeon: Andria Meuse, MD;  Location: [facility];  Service: General;  Laterality: N/A;   CORONARY ARTERY BYPASS GRAFT N/A 07/29/2018   Procedure: CORONARY ARTERY BYPASS GRAFTING (CABG) x 2;  Surgeon: Corliss Skains, MD;  Location: [facility] OR;  Service: Open Heart Surgery;  Laterality: N/A;   CORONARY PRESSURE/FFR STUDY N/A 07/20/2018   Procedure: INTRAVASCULAR PRESSURE WIRE/FFR STUDY;  Surgeon: Corky Crafts, MD;  Location: [facility] INVASIVE CV LAB;  Service: Cardiovascular;  Laterality: N/A;   ENDOVEIN HARVEST OF GREATER SAPHENOUS VEIN Left 07/29/2018   Procedure: Endovein Harvest Of Greater Saphenous Vein Left Thigh; Exploration only of  right leg greater saphenous vein;  Surgeon: Corliss Skains, MD;  Location: [facility] OR;  Service: Open Heart Surgery;  Laterality: Left;   LEFT HEART CATH AND CORONARY ANGIOGRAPHY N/A 07/20/2018   Procedure: LEFT HEART CATH AND CORONARY ANGIOGRAPHY;  Surgeon: Corky Crafts, MD;  Location: [facility] INVASIVE CV LAB;  Service: Cardiovascular;  Laterality: N/A;   TEE WITHOUT CARDIOVERSION N/A 07/29/2018   Procedure: TRANSESOPHAGEAL ECHOCARDIOGRAM (TEE);  Surgeon: Corliss Skains, MD;  Location: [facility] OR;  Service: Open Heart Surgery;  Laterality: N/A;    Family History  Problem Relation Age of Onset   CAD Mother    CAD Father     Social History   Socioeconomic History   Marital status: Married    Spouse name: Not on file   Number of children: 2   Years of education: Not on file   Highest education level: Not on file  Occupational History   Not on file  Tobacco Use   Smoking status: Never   Smokeless tobacco: Never  Vaping Use   Vaping status: Never Used  Substance and Sexual Activity   Alcohol use: Never   Drug use: Never   Sexual activity: Yes    Comment: married Estate agent)  Other Topics Concern   Not on file  Social History Narrative   Right handed   Drinks caffeine   Two story home   Social Drivers of Health   Financial Resource Strain: Not on file  Food Insecurity: Not on file  Transportation Needs: Not on file  Physical Activity: Not on file  Stress: Not on file  Social Connections: Not on file  Intimate Partner Violence: Not on file    Review of Systems  Respiratory:  Positive for chest tightness and shortness of breath.        Xiphisternum pain    Vitals:   03/30/23 1600  BP: (!) 148/86  Pulse: 62     Physical Exam Constitutional:      Appearance: Normal appearance.  HENT:     Head: Normocephalic.     Mouth/Throat:     Mouth: Mucous membranes are moist.  Eyes:     General: No scleral icterus. Cardiovascular:     Rate and Rhythm: Normal rate.      Heart sounds: No murmur heard.    No friction rub.  Pulmonary:     Effort: No respiratory distress.     Breath sounds: No stridor. No wheezing or rhonchi.     Comments: No chest tenderness No tenderness around the xiphisternum Musculoskeletal:     Cervical back: No rigidity or tenderness.  Neurological:     Mental Status: He is alert.  Psychiatric:        Mood and Affect: Mood normal.  Data Reviewed: Spirometry 4 years ago 07/28/2018 was within normal limits  CT scan of the chest 04/10/2017 reviewed showing some nodules-likely granulomatous disease-actual film not available for review  Pulmonary function test was reviewed with the patient showing no obstruction, no restriction, normal diffusing capacity  Recent echocardiogram 11/26/2022-normal ejection fraction, LVH  CT scan of the chest was reviewed-old granulomatous disease  Assessment:  Continues to have chest discomfort without associated shortness of breath  Persistent recurrent symptoms  Echocardiogram, pulmonary function test within normal limits Never smoker  Pain likely musculoskeletal/neuropathic   Plan/Recommendations: Trial with Cymbalta 30 mg  May need to increase dose if well-tolerated but not optimal  May still need to consider GI evaluation if persistence of symptoms  Follow-up in about 3 months  Encouraged to call us with significant concerns   Virl Diamond MD [facility] 03/30/2023, 4:07 PM  CC: Mila Palmer, MD

## 2023-03-30 NOTE — Patient Instructions (Signed)
 Full PFT Performed Today

## 2023-03-30 NOTE — Progress Notes (Signed)
 Full PFT Performed Today

## 2023-04-01 ENCOUNTER — Encounter: Payer: Self-pay | Admitting: Cardiology

## 2023-04-06 ENCOUNTER — Telehealth: Payer: Self-pay

## 2023-04-06 NOTE — Telephone Encounter (Signed)
 Copied from CRM [id number]. Topic: Clinical - Medication Question >> Apr 06, 2023  9:34 AM Nila Nephew wrote: Reason for CRM: Patient is calling to state that he has stopped taking DULoxetine (CYMBALTA) 30 MG capsule. Patient states has tried it and is having no improvement. Patient states also noticed per online resources it is for depression and patient would not like to continue taking it.   Patient is requesting a referral to GI as well, did not elaborate when prompted.

## 2023-05-07 NOTE — Progress Notes (Signed)
 Chief Complaint: Follow-up Primary GI MD: Dr. Venice Gillis  HPI: 64 year old male history of CABG x 2 (2020), cholelithiasis, type 2 diabetes, presents for follow-up  Seen 10/14/2022.  Epigastric pain that is chronic, exertional, positional, nonprandial with no dysphagia or heartburn and pain similar to prior angina.  CBC, CMP, lipase unrevealing.  Recommended to see cardiology prior to consideration of EGD/colonoscopy  Discussed the use of AI scribe software for clinical note transcription with the patient, who gave verbal consent to proceed.  History of Present Illness He experiences persistent pain in the upper abdomen, which worsens with activities such as bending over, washing dishes, or leaning forward. The pain is described as a 'hard rock' sensation in the center of the chest and does not worsen with deep breaths. Ibuprofen , taken occasionally for knee pain, does not alleviate the abdominal pain.  The pain began approximately four years ago, leading to the removal of his gallbladder, which did not relieve the symptoms. He recalls an episode at work where he experienced heavy breathing and visited the emergency room, but no cause was identified after a six-hour stay.  He recently started Trulicity after switching from Jardiance and notes the onset of heartburn since starting this medication. He has a history of being prescribed Cymbalta  for muscle pain and depression but discontinued it due to side effects.  He recalls a previous severe episode during a camping trip in [place] , which necessitated cutting the trip short and seeking emergency care. No family history of colon cancer and he has never undergone a colonoscopy.  Past Medical History:  Diagnosis Date   Actinic keratoses    Ankle dislocation    calcaneal tendons   Anxiety    Aortic atherosclerosis (HCC)    Chronic pain of left knee 11/18/2017   Coronary artery disease    DDD (degenerative disc disease), cervical    Diabetes  mellitus without complication (HCC) 2019   no meds   Displaced other extraarticular fracture of right calcaneus, initial encounter for closed fracture 07/13/2017   DOE (dyspnea on exertion)    Dyspnea    Fatty liver    mild   Fracture of distal end of right fibula    Gallstones    History of kidney stones    Hypercholesteremia    Hypercholesterolemia    Hypertension    Lightheadedness    Neck pain    due to work    Neuromuscular disorder (HCC)    carpal tunnel bi lat   Prediabetes    Pulmonary nodules    Stress reaction    Stroke (HCC)    2018 no residuals   TIA (transient ischemic attack)    circulation     Past Surgical History:  Procedure Laterality Date   BACK SURGERY  2005   L4-L5 fusion   CHOLECYSTECTOMY N/A 03/02/2020   Procedure: LAPAROSCOPIC CHOLECYSTECTOMY;  Surgeon: Melvenia Stabs, MD;  Location: [facility];  Service: General;  Laterality: N/A;   CORONARY ARTERY BYPASS GRAFT N/A 07/29/2018   Procedure: CORONARY ARTERY BYPASS GRAFTING (CABG) x 2;  Surgeon: Hilarie Lovely, MD;  Location: [facility] OR;  Service: Open Heart Surgery;  Laterality: N/A;   CORONARY PRESSURE/FFR STUDY N/A 07/20/2018   Procedure: INTRAVASCULAR PRESSURE WIRE/FFR STUDY;  Surgeon: Lucendia Rusk, MD;  Location: [facility] INVASIVE CV LAB;  Service: Cardiovascular;  Laterality: N/A;   ENDOVEIN HARVEST OF GREATER SAPHENOUS VEIN Left 07/29/2018   Procedure: Endovein Harvest Of Greater Saphenous Vein Left Thigh; Exploration only  of right leg greater saphenous vein;  Surgeon: Hilarie Lovely, MD;  Location: [facility] OR;  Service: Open Heart Surgery;  Laterality: Left;   LEFT HEART CATH AND CORONARY ANGIOGRAPHY N/A 07/20/2018   Procedure: LEFT HEART CATH AND CORONARY ANGIOGRAPHY;  Surgeon: Lucendia Rusk, MD;  Location: [facility] INVASIVE CV LAB;  Service: Cardiovascular;  Laterality: N/A;   TEE WITHOUT CARDIOVERSION N/A 07/29/2018   Procedure: TRANSESOPHAGEAL ECHOCARDIOGRAM (TEE);  Surgeon: Hilarie Lovely, MD;  Location: [facility] OR;  Service: Open Heart Surgery;  Laterality: N/A;    Current Outpatient Medications  Medication Sig Dispense Refill   aspirin  EC 81 MG tablet Take 81 mg by mouth daily.     atorvastatin  (LIPITOR ) 80 MG tablet Take 1 tablet by mouth once daily 90 tablet 3   clopidogrel  (PLAVIX ) 75 MG tablet Take 75 mg by mouth daily.     DULoxetine  (CYMBALTA ) 30 MG capsule Take 1 capsule (30 mg total) by mouth daily. 30 capsule 3   ibuprofen  (ADVIL ) 800 MG tablet Take 800 mg by mouth 3 (three) times daily as needed for mild pain or moderate pain.      metoprolol  tartrate (LOPRESSOR ) 25 MG tablet Take 1 tablet by mouth twice daily 180 tablet 3   TRULICITY 0.75 MG/0.5ML SOAJ INJECT 0.75 MG INTO THE SKIN ONCE A WEEK FOR 30 DAYS     empagliflozin (JARDIANCE) 10 MG TABS tablet Take 1 tablet by mouth daily. (Patient not taking: Reported on 05/08/2023)     nitroGLYCERIN  (NITROSTAT ) 0.4 MG SL tablet Place 1 tablet (0.4 mg total) under the tongue every 5 (five) minutes as needed. 25 tablet 3   No current facility-administered medications for this visit.    Allergies as of 05/08/2023 - Review Complete 05/08/2023  Allergen Reaction Noted   Metformin and related Other (See Comments) and Shortness Of Breath 07/13/2018   Metformin hcl  01/31/2020   Codeine Itching 07/03/2021   Oxycodone  Itching 06/07/2018   Oxycodone  hcl Itching 01/31/2020    Family History  Problem Relation Age of Onset   CAD Mother    CAD Father     Social History   Socioeconomic History   Marital status: Married    Spouse name: Not on file   Number of children: 2   Years of education: Not on file   Highest education level: Not on file  Occupational History   Not on file  Tobacco Use   Smoking status: Never   Smokeless tobacco: Never  Vaping Use   Vaping status: Never Used  Substance and Sexual Activity   Alcohol use: Never   Drug use: Never   Sexual activity: Yes    Comment: married Estate agent)  Other Topics  Concern   Not on file  Social History Narrative   Right handed   Drinks caffeine   Two story home   Social Drivers of Health   Financial Resource Strain: Not on file  Food Insecurity: Not on file  Transportation Needs: Not on file  Physical Activity: Not on file  Stress: Not on file  Social Connections: Not on file  Intimate Partner Violence: Not on file    Review of Systems:    Constitutional: No weight loss, fever, chills, weakness or fatigue HEENT: Eyes: No change in vision               Ears, Nose, Throat:  No change in hearing or congestion Skin: No rash or itching Cardiovascular: No chest pain, chest pressure or  palpitations   Respiratory: No SOB or cough Gastrointestinal: See HPI and otherwise negative Genitourinary: No dysuria or change in urinary frequency Neurological: No headache, dizziness or syncope Musculoskeletal: No new muscle or joint pain Hematologic: No bleeding or bruising Psychiatric: No history of depression or anxiety    Physical Exam:  Vital signs: BP 130/82   Pulse 74   Ht 5\' 9"  (1.753 m)   Wt 191 lb (86.6 kg)   BMI 28.21 kg/m   Constitutional: NAD, alert and cooperative Head:  Normocephalic and atraumatic. Eyes:   PEERL, EOMI. No icterus. Conjunctiva pink. Respiratory: Respirations even and unlabored. Lungs clear to auscultation bilaterally.   No wheezes, crackles, or rhonchi.  Cardiovascular:  Regular rate and rhythm. No peripheral edema, cyanosis or pallor.  Gastrointestinal:  Soft, nondistended, tenderness localized to xiphoid process. No rebound or guarding. Normal bowel sounds. No appreciable masses or hepatomegaly. Rectal:  Declines Msk:  Symmetrical without gross deformities. Without edema, no deformity or joint abnormality.  Neurologic:  Alert and  oriented x4;  grossly normal neurologically.  Skin:   Dry and intact without significant lesions or rashes. Psychiatric: Oriented to person, place and time. Demonstrates good judgement  and reason without abnormal affect or behaviors.   RELEVANT LABS AND IMAGING: CBC    Component Value Date/Time   WBC 6.8 01/22/2023 0814   RBC 5.48 01/22/2023 0814   HGB 16.1 01/22/2023 0814   HGB 13.4 05/14/2020 1015   HCT 49.1 01/22/2023 0814   HCT 39.3 05/14/2020 1015   PLT 253 01/22/2023 0814   PLT 252 05/14/2020 1015   MCV 89.6 01/22/2023 0814   MCV 91 05/14/2020 1015   MCH 29.4 01/22/2023 0814   MCHC 32.8 01/22/2023 0814   RDW 14.9 01/22/2023 0814   RDW 13.0 05/14/2020 1015   LYMPHSABS 1.6 10/14/2022 1452   MONOABS 0.4 10/14/2022 1452   EOSABS 0.1 10/14/2022 1452   BASOSABS 0.0 10/14/2022 1452    CMP     Component Value Date/Time   NA 132 (L) 01/22/2023 0814   NA 142 04/03/2020 1418   K 3.2 (L) 01/22/2023 0814   CL 102 01/22/2023 0814   CO2 19 (L) 01/22/2023 0814   GLUCOSE 216 (H) 01/22/2023 0814   BUN 19 01/22/2023 0814   BUN 14 04/03/2020 1418   CREATININE 1.16 01/22/2023 0814   CALCIUM  9.5 01/22/2023 0814   PROT 7.5 01/22/2023 0814   ALBUMIN  4.3 01/22/2023 0814   AST 27 01/22/2023 0814   ALT 14 01/22/2023 0814   ALKPHOS 90 01/22/2023 0814   BILITOT 1.4 (H) 01/22/2023 0814   GFRNONAA >60 01/22/2023 0814   GFRAA >60 10/01/2019 2105     Assessment/Plan:   CAD s/p CABG x 2 Seen by cardiology January 2025 with overall unremarkable workup including echo and stress test.  Echocardiogram 11/2022 with EF 60 to 65%.  No further exertional angina chest pain  Epigastric pain Ongoing for years.  More pronounced when bending forward and associated with difficulty breathing.  Not changed with eating.  No improvement with ibuprofen .  No improvement after cholecystectomy.  Tenderness localized to xiphoid process.  No previous EGD.   CTAP 2021 with partial fatty replacement pancreas, normal lipase.   CT chest with pulmonary nodule Suspect with his pinpoint epigastric pain being localized to the xiphoid process associated with movement he may have costochondritis  versus xiphodynia.  Less likely concern for GERD/PUD since no association with eating.   - EGD for further evaluation - I thoroughly  discussed the procedure with the patient (at bedside) to include nature of the procedure, alternatives, benefits, and risks (including but not limited to bleeding, infection, perforation, anesthesia/cardiac pulmonary complications).  Patient verbalized understanding and gave verbal consent to proceed with procedure. - If EGD is negative, consider MRCP to evaluate pancreas with previous CT showing fatty replacement -- if MRCP is negative, refer back to PCP for MSK etiology  Colon cancer screening No previous colonoscopy - Schedule colonoscopy - I thoroughly discussed the procedure with the patient (at bedside) to include nature of the procedure, alternatives, benefits, and risks (including but not limited to bleeding, infection, perforation, anesthesia/cardiac pulmonary complications).  Patient verbalized understanding and gave verbal consent to proceed with procedure.   TIA On Plavix  Hold Plavix  x 5 days - Will hold Plavix  5 days prior to endoscopic procedures - will instruct when and how to resume after procedure. Benefits and risks of procedure explained including risks of bleeding, perforation, infection, missed lesions, reactions to medications and possible need for hospitalization and surgery for complications. Additional rare but real risk of stroke or other vascular clotting events off Plavix  also explained and need to seek urgent help if any signs of these problems occur. Will communicate by phone or EMR with patient's  prescribing provider to confirm that holding Plavix  is reasonable in this case.     Gigi [facility] 05/08/2023, 10:36 AM  Cc: Olin Bertin, MD

## 2023-05-08 ENCOUNTER — Encounter: Payer: Self-pay | Admitting: Gastroenterology

## 2023-05-08 ENCOUNTER — Telehealth: Payer: Self-pay

## 2023-05-08 ENCOUNTER — Ambulatory Visit (INDEPENDENT_AMBULATORY_CARE_PROVIDER_SITE_OTHER): Admitting: Gastroenterology

## 2023-05-08 VITALS — BP 130/82 | HR 74 | Ht 69.0 in | Wt 191.0 lb

## 2023-05-08 DIAGNOSIS — R1013 Epigastric pain: Secondary | ICD-10-CM | POA: Diagnosis not present

## 2023-05-08 DIAGNOSIS — Z7902 Long term (current) use of antithrombotics/antiplatelets: Secondary | ICD-10-CM

## 2023-05-08 DIAGNOSIS — Z8673 Personal history of transient ischemic attack (TIA), and cerebral infarction without residual deficits: Secondary | ICD-10-CM | POA: Diagnosis not present

## 2023-05-08 DIAGNOSIS — Z951 Presence of aortocoronary bypass graft: Secondary | ICD-10-CM

## 2023-05-08 DIAGNOSIS — I2510 Atherosclerotic heart disease of native coronary artery without angina pectoris: Secondary | ICD-10-CM

## 2023-05-08 DIAGNOSIS — Z1211 Encounter for screening for malignant neoplasm of colon: Secondary | ICD-10-CM

## 2023-05-08 MED ORDER — NA SULFATE-K SULFATE-MG SULF 17.5-3.13-1.6 GM/177ML PO SOLN: 1.0000 | Freq: Once | ORAL | 0 refills | Status: AC

## 2023-05-08 NOTE — Telephone Encounter (Signed)
  Tom Phelps 1960/05/16 [id number]  05/08/23   Dear [name] Debi Fall, DO:  We have scheduled the above named patient for a(n) Colonoscopy and Endoscopy procedure. Our records show that (s)he is on anticoagulation therapy.  Please advise as to whether the patient may come off their therapy of Plavix  5 days prior to their procedure which is scheduled for 06/26/23.  Please route your response to Lianne Redo, CMA or fax response to [phone].  Sincerely,    [facility]

## 2023-05-08 NOTE — Telephone Encounter (Signed)
  Tom Phelps 08-31-1960 [id number]  05/08/2023   Dear Olinda Bertrand, DO:  We have scheduled the above named patient for a(n) Colonoscopy and Endoscopy procedure. Our records show that (s)he is on anticoagulation therapy.  Please advise as to whether the patient may come off their therapy of Plavix  5 days prior to their procedure which is scheduled for 06/26/2023.  Please route your response to Lianne Redo, CMA or fax response to [phone].  Sincerely,    [facility]

## 2023-05-08 NOTE — Patient Instructions (Signed)
 You have been scheduled for an Endoscopy and Colonoscopy. Please follow the written instructions given to you at your visit today.  If you use inhalers (even only as needed), please bring them with you on the day of your procedure.  DO NOT TAKE 7 DAYS PRIOR TO TEST- Trulicity (dulaglutide) Ozempic, Wegovy (semaglutide) Mounjaro (tirzepatide) Bydureon Bcise (exanatide extended release)  DO NOT TAKE 1 DAY PRIOR TO YOUR TEST Rybelsus (semaglutide) Adlyxin (lixisenatide) Victoza (liraglutide) Byetta (exanatide) ___________________________________________________________________________  Please follow up sooner if symptoms increase or worsen __________________________________________________________________________  Due to recent changes in healthcare laws, you may see the results of your imaging and laboratory studies on MyChart before your provider has had a chance to review them.  We understand that in some cases there may be results that are confusing or concerning to you. Not all laboratory results come back in the same time frame and the provider may be waiting for multiple results in order to interpret others.  Please give us  48 hours in order for your provider to thoroughly review all the results before contacting the office for clarification of your results.   _______________________________________________________  If your blood pressure at your visit was 140/90 or greater, please contact your primary care physician to follow up on this.  _______________________________________________________  If you are age 30 or older, your body mass index should be between 23-30. Your Body mass index is 28.21 kg/m. If this is out of the aforementioned range listed, please consider follow up with your Primary Care Provider.  If you are age 47 or younger, your body mass index should be between 19-25. Your Body mass index is 28.21 kg/m. If this is out of the aformentioned range listed, please  consider follow up with your Primary Care Provider.   ________________________________________________________  The [facility] providers would like to encourage you to use MYCHART to communicate with providers for non-urgent requests or questions.  Due to long hold times on the telephone, sending your provider a message by [facility] may be a faster and more efficient way to get a response.  Please allow 48 business hours for a response.  Please remember that this is for non-urgent requests.  _______________________________________________________ Thank you for trusting me with your gastrointestinal care!   Suzanna Erp, PA

## 2023-05-11 NOTE — Telephone Encounter (Signed)
 Yes, can hold Plavix  and aspirin  prior to endoscopy and restart post-procedure as per GI recommendations.   Please inform the patient.   Shaneese Tait [place], DO, [facility]

## 2023-05-11 NOTE — Telephone Encounter (Signed)
 Duplicate message. Message if additional help / questions regarding his care.   Samreen Seltzer [place], DO, [facility]

## 2023-05-14 NOTE — Telephone Encounter (Signed)
 I called patient to inform him to hold his Plavix  there was no answer. A voice message was left.

## 2023-05-15 NOTE — Telephone Encounter (Signed)
 Patient returned call

## 2023-05-18 NOTE — Telephone Encounter (Signed)
 I spoke with the patient and informed him to hold his Plavix  5 days prior to his procedure. He is aware and understands and will give me a call if he has any questions or concerns before his procedure.

## 2023-05-21 ENCOUNTER — Telehealth: Payer: Self-pay | Admitting: *Deleted

## 2023-05-21 NOTE — Telephone Encounter (Signed)
   Name: Tom Phelps  DOB: [birth year]  MRN: [id number]  Primary Cardiologist: Olinda Bertrand, DO   Preoperative team, please contact this patient and set up a phone call appointment for further preoperative risk assessment. Please obtain consent and complete medication review. Thank you for your help.  I confirm that guidance regarding antiplatelet and oral anticoagulation therapy has been completed and, if necessary, noted below.  Ideally aspirin  should be continued without interruption, however if the bleeding risk is too great, aspirin  may be held for 5-7 days prior to surgery. Please resume aspirin  post operatively when it is felt to be safe from a bleeding standpoint.    I also confirmed the patient resides in the state of [place] . As per [facility] Medical Board telemedicine laws, the patient must reside in the state in which the provider is licensed.   Ava Boatman, NP 05/21/2023, 5:02 PM [facility]

## 2023-05-21 NOTE — Telephone Encounter (Signed)
   Pre-operative Risk Assessment    Patient Name: Tom Phelps  DOB: 1961/01/05 MRN: [id number]   Date of last office visit: 01/28/23 DR. TOLIA Date of next office visit: NONE   Request for Surgical Clearance    Procedure:  RIGHT CARPAL TUNNEL RELEASE, RIGHT CUBITAL TUNNEL RELEASE IN SITU, POSSIBLE TRANSPOSITION   Date of Surgery:  Clearance 06/05/23                                Surgeon:  DR. Nieves Bars Surgeon's Group or Practice Name:  [facility] Phone number:  [phone] MEGAN DAVIS Fax number:  [phone]   Type of Clearance Requested:   - Medical  - Pharmacy:  Hold Aspirin      Type of Anesthesia:  MAC and REGIONAL   Additional requests/questions:    Tom Phelps   05/21/2023, 4:15 PM

## 2023-05-21 NOTE — Telephone Encounter (Signed)
 Pt has been scheduled tele preop appt 05/27/23. Med rec and consent are done.       Patient Consent for Virtual Visit        Tom Phelps has provided verbal consent on 05/21/2023 for a virtual visit (video or telephone).   CONSENT FOR VIRTUAL VISIT FOR:  Tom Phelps  By participating in this virtual visit I agree to the following:  I hereby voluntarily request, consent and authorize [facility] and its employed or contracted physicians, physician assistants, nurse practitioners or other licensed health care professionals (the Practitioner), to provide me with telemedicine health care services (the "Services") as deemed necessary by the treating Practitioner. I acknowledge and consent to receive the Services by the Practitioner via telemedicine. I understand that the telemedicine visit will involve communicating with the Practitioner through live audiovisual communication technology and the disclosure of certain medical information by electronic transmission. I acknowledge that I have been given the opportunity to request an in-person assessment or other available alternative prior to the telemedicine visit and am voluntarily participating in the telemedicine visit.  I understand that I have the right to withhold or withdraw my consent to the use of telemedicine in the course of my care at any time, without affecting my right to future care or treatment, and that the Practitioner or I may terminate the telemedicine visit at any time. I understand that I have the right to inspect all information obtained and/or recorded in the course of the telemedicine visit and may receive copies of available information for a reasonable fee.  I understand that some of the potential risks of receiving the Services via telemedicine include:  Delay or interruption in medical evaluation due to technological equipment failure or disruption; Information transmitted may not be sufficient (e.g. poor resolution of  images) to allow for appropriate medical decision making by the Practitioner; and/or  In rare instances, security protocols could fail, causing a breach of personal health information.  Furthermore, I acknowledge that it is my responsibility to provide information about my medical history, conditions and care that is complete and accurate to the best of my ability. I acknowledge that Practitioner's advice, recommendations, and/or decision may be based on factors not within their control, such as incomplete or inaccurate data provided by me or distortions of diagnostic images or specimens that may result from electronic transmissions. I understand that the practice of medicine is not an exact science and that Practitioner makes no warranties or guarantees regarding treatment outcomes. I acknowledge that a copy of this consent can be made available to me via my patient portal [facility] MyChart), or I can request a printed copy by calling the office of [facility].    I understand that my insurance will be billed for this visit.   I have read or had this consent read to me. I understand the contents of this consent, which adequately explains the benefits and risks of the Services being provided via telemedicine.  I have been provided ample opportunity to ask questions regarding this consent and the Services and have had my questions answered to my satisfaction. I give my informed consent for the services to be provided through the use of telemedicine in my medical care

## 2023-05-21 NOTE — Telephone Encounter (Signed)
 Pt has been scheduled tele preop appt 05/27/23. Med rec and consent are done.

## 2023-05-27 ENCOUNTER — Ambulatory Visit: Attending: Cardiology | Admitting: Emergency Medicine

## 2023-05-27 DIAGNOSIS — Z01810 Encounter for preprocedural cardiovascular examination: Secondary | ICD-10-CM

## 2023-05-27 NOTE — Progress Notes (Signed)
 Virtual Visit via Telephone Note   Because of Tom Phelps co-morbid illnesses, he is at least at moderate risk for complications without adequate follow up.  This format is felt to be most appropriate for this patient at this time.  Due to technical limitations with video connection (technology), today's appointment will be conducted as an audio only telehealth visit, and Tom Phelps verbally agreed to proceed in this manner.   All issues noted in this document were discussed and addressed.  No physical exam could be performed with this format.  Evaluation Performed:  Preoperative cardiovascular risk assessment _____________   Date:  05/27/2023   Patient ID:  Tom Phelps, DOB 1960/12/02, MRN [id number] Patient Location:  Home Provider location:   Office  Primary Care Provider:  Olin Bertin, MD Primary Cardiologist:  Tom Bertrand, DO  Chief Complaint / Patient Profile   63 y.o. y/o male with Phelps h/o CAD s/p CABG x 2 with LIMA to the LAD and RSVG to OM 2, type 2 diabetes, hyperlipidemia, anxiety, TIA in 2018, hypertension who is pending right carpal tunnel release, right cubital tunnel release in situ, possible transposition on 06/05/2023 by [facility] with Dr. Trinda Phelps and presents today for telephonic preoperative cardiovascular risk assessment.  History of Present Illness    Tom Phelps is Phelps 63 y.o. male who presents via audio/video conferencing for Phelps telehealth visit today.  Pt was last seen in cardiology clinic on 01/28/2023 by Dr Tom Phelps.  At that time Tom Phelps was doing well.  The patient is now pending procedure as outlined above.   He tells me today that he no longer is going to have his scheduled surgery 10th of this month.  He notes that he has not yet called [facility] and notified them.  Otherwise he is doing well from Phelps cardiac standpoint without acute cardiovascular concerns or complaints today.  Past Medical History    Past Medical History:  Diagnosis Date   Actinic keratoses     Ankle dislocation    calcaneal tendons   Anxiety    Aortic atherosclerosis (HCC)    Chronic pain of left knee 11/18/2017   Coronary artery disease    DDD (degenerative disc disease), cervical    Diabetes mellitus without complication (HCC) 2019   no meds   Displaced other extraarticular fracture of right calcaneus, initial encounter for closed fracture 07/13/2017   DOE (dyspnea on exertion)    Dyspnea    Fatty liver    mild   Fracture of distal end of right fibula    Gallstones    History of kidney stones    Hypercholesteremia    Hypercholesterolemia    Hypertension    Lightheadedness    Neck pain    due to work    Neuromuscular disorder (HCC)    carpal tunnel bi lat   Prediabetes    Pulmonary nodules    Stress reaction    Stroke (HCC)    2018 no residuals   TIA (transient ischemic attack)    circulation    Past Surgical History:  Procedure Laterality Date   BACK SURGERY  2005   L4-L5 fusion   CHOLECYSTECTOMY N/Phelps 03/02/2020   Procedure: LAPAROSCOPIC CHOLECYSTECTOMY;  Surgeon: Melvenia Stabs, MD;  Location: [facility] ORS;  Service: General;  Laterality: N/Phelps;   CORONARY ARTERY BYPASS GRAFT N/Phelps 07/29/2018   Procedure: CORONARY ARTERY BYPASS GRAFTING (CABG) x 2;  Surgeon: Hilarie Lovely, MD;  Location: [facility] OR;  Service: Open Heart Surgery;  Laterality: N/Phelps;   CORONARY PRESSURE/FFR STUDY N/Phelps 07/20/2018   Procedure: INTRAVASCULAR PRESSURE WIRE/FFR STUDY;  Surgeon: Lucendia Rusk, MD;  Location: [facility] INVASIVE CV LAB;  Service: Cardiovascular;  Laterality: N/Phelps;   ENDOVEIN HARVEST OF GREATER SAPHENOUS VEIN Left 07/29/2018   Procedure: Endovein Harvest Of Greater Saphenous Vein Left Thigh; Exploration only of right leg greater saphenous vein;  Surgeon: Hilarie Lovely, MD;  Location: [facility] OR;  Service: Open Heart Surgery;  Laterality: Left;   LEFT HEART CATH AND CORONARY ANGIOGRAPHY N/Phelps 07/20/2018   Procedure: LEFT HEART CATH AND CORONARY ANGIOGRAPHY;  Surgeon: Lucendia Rusk, MD;  Location: [facility] INVASIVE CV LAB;  Service: Cardiovascular;  Laterality: N/Phelps;   TEE WITHOUT CARDIOVERSION N/Phelps 07/29/2018   Procedure: TRANSESOPHAGEAL ECHOCARDIOGRAM (TEE);  Surgeon: Hilarie Lovely, MD;  Location: [facility] OR;  Service: Open Heart Surgery;  Laterality: N/Phelps;    Allergies  Allergies  Allergen Reactions   Metformin And Related Other (See Comments) and Shortness Of Breath    Pressure on heart  Other reaction(s): Other (See Comments), SOB  Pressure on heart  Other reaction(s): SOB   Metformin Hcl     Other reaction(s): SOB   Codeine Itching   Oxycodone  Itching   Oxycodone  Hcl Itching    Other reaction(s): itching    Home Medications    Prior to Admission medications   Medication Sig Start Date End Date Taking? Authorizing Provider  aspirin  EC 81 MG tablet Take 81 mg by mouth daily.    [provider]  atorvastatin  (LIPITOR ) 80 MG tablet Take 1 tablet by mouth once daily 03/13/23   Phelps, Sunit, DO  clopidogrel  (PLAVIX ) 75 MG tablet Take 75 mg by mouth daily.    [provider]  DULoxetine  (CYMBALTA ) 30 MG capsule Take 1 capsule (30 mg total) by mouth daily. 03/30/23   Olalere, Tom Bidding A, MD  empagliflozin (JARDIANCE) 10 MG TABS tablet Take 1 tablet by mouth daily. Patient not taking: Reported on 05/21/2023    [provider]  ibuprofen  (ADVIL ) 800 MG tablet Take 800 mg by mouth 3 (three) times daily as needed for mild pain or moderate pain.  10/08/18   [provider]  metoprolol  tartrate (LOPRESSOR ) 25 MG tablet Take 1 tablet by mouth twice daily 12/09/22   Phelps, Tom H Jr., NP  nitroGLYCERIN  (NITROSTAT ) 0.4 MG SL tablet Place 1 tablet (0.4 mg total) under the tongue every 5 (five) minutes as needed. 10/24/22 01/28/23  Tom Phelps., NP  TRULICITY 0.75 MG/0.5ML SOAJ INJECT 0.75 MG INTO THE SKIN ONCE Phelps WEEK FOR 30 DAYS    [provider]    Physical Exam    Vital Signs:  [name] does not have vital signs  available for review today.  Given telephonic nature of communication, physical exam is limited. AAOx3. NAD. Normal affect.  Speech and respirations are unlabored.  Accessory Clinical Findings    None  Assessment & Plan    1.  Preoperative Cardiovascular Risk Assessment:  Today patient tells me that he plans to cancel his upcoming procedure.  He does not plan to reschedule procedure within the next several months.  Preoperative cardiovascular risk assessment was not completed today.  I will alert requesting office of patient's decision.  If patient decides to go through with surgery in the future please feel free to reach back out to our service for preoperative cardiovascular assessment at that time.  Phelps copy of this note will be routed to requesting  Careers adviser.  Time:   Today, I have spent 3 minutes with the patient with telehealth technology discussing medical history, symptoms, and management plan.     Ava Boatman, NP  05/27/2023, 2:06 PM

## 2023-06-19 ENCOUNTER — Encounter: Payer: Self-pay | Admitting: Gastroenterology

## 2023-06-26 ENCOUNTER — Encounter: Payer: Self-pay | Admitting: Gastroenterology

## 2023-06-26 ENCOUNTER — Ambulatory Visit: Admitting: Gastroenterology

## 2023-06-26 VITALS — BP 128/80 | HR 56 | Temp 98.1°F | Resp 14 | Ht 69.0 in | Wt 191.0 lb

## 2023-06-26 DIAGNOSIS — K5730 Diverticulosis of large intestine without perforation or abscess without bleeding: Secondary | ICD-10-CM

## 2023-06-26 DIAGNOSIS — K2950 Unspecified chronic gastritis without bleeding: Secondary | ICD-10-CM | POA: Diagnosis not present

## 2023-06-26 DIAGNOSIS — K2289 Other specified disease of esophagus: Secondary | ICD-10-CM | POA: Diagnosis not present

## 2023-06-26 DIAGNOSIS — Z1211 Encounter for screening for malignant neoplasm of colon: Secondary | ICD-10-CM

## 2023-06-26 DIAGNOSIS — K315 Obstruction of duodenum: Secondary | ICD-10-CM | POA: Diagnosis not present

## 2023-06-26 DIAGNOSIS — K222 Esophageal obstruction: Secondary | ICD-10-CM

## 2023-06-26 DIAGNOSIS — K449 Diaphragmatic hernia without obstruction or gangrene: Secondary | ICD-10-CM

## 2023-06-26 DIAGNOSIS — R1013 Epigastric pain: Secondary | ICD-10-CM

## 2023-06-26 DIAGNOSIS — K259 Gastric ulcer, unspecified as acute or chronic, without hemorrhage or perforation: Secondary | ICD-10-CM

## 2023-06-26 DIAGNOSIS — K640 First degree hemorrhoids: Secondary | ICD-10-CM | POA: Diagnosis not present

## 2023-06-26 DIAGNOSIS — B9681 Helicobacter pylori [H. pylori] as the cause of diseases classified elsewhere: Secondary | ICD-10-CM

## 2023-06-26 MED ORDER — PANTOPRAZOLE SODIUM 40 MG PO TBEC
40.0000 mg | DELAYED_RELEASE_TABLET | Freq: Every day | ORAL | 4 refills | Status: AC
Start: 2023-06-26 — End: ?

## 2023-06-26 MED ORDER — SODIUM CHLORIDE 0.9 % IV SOLN: 500.0000 mL | Freq: Once | INTRAVENOUS | Status: DC

## 2023-06-26 NOTE — Progress Notes (Signed)
 Chief Complaint: Follow-up Primary GI MD: Dr. Venice Gillis   HPI: 63 year old male history of CABG x 2 (2020), cholelithiasis, type 2 diabetes, presents for follow-up   Seen 10/14/2022.  Epigastric pain that is chronic, exertional, positional, nonprandial with no dysphagia or heartburn and pain similar to prior angina.  CBC, CMP, lipase unrevealing.  Recommended to see cardiology prior to consideration of EGD/colonoscopy   Discussed the use of AI scribe software for clinical note transcription with the patient, who gave verbal consent to proceed.   History of Present Illness He experiences persistent pain in the upper abdomen, which worsens with activities such as bending over, washing dishes, or leaning forward. The pain is described as a 'hard rock' sensation in the center of the chest and does not worsen with deep breaths. Ibuprofen , taken occasionally for knee pain, does not alleviate the abdominal pain.   The pain began approximately four years ago, leading to the removal of his gallbladder, which did not relieve the symptoms. He recalls an episode at work where he experienced heavy breathing and visited the emergency room, but no cause was identified after a six-hour stay.   He recently started Trulicity after switching from Jardiance and notes the onset of heartburn since starting this medication. He has a history of being prescribed Cymbalta  for muscle pain and depression but discontinued it due to side effects.   He recalls a previous severe episode during a camping trip in [place] , which necessitated cutting the trip short and seeking emergency care. No family history of colon cancer and he has never undergone a colonoscopy.       Past Medical History:  Diagnosis Date   Actinic keratoses     Ankle dislocation      calcaneal tendons   Anxiety     Aortic atherosclerosis (HCC)     Chronic pain of left knee 11/18/2017   Coronary artery disease     DDD (degenerative disc disease),  cervical     Diabetes mellitus without complication (HCC) 2019    no meds   Displaced other extraarticular fracture of right calcaneus, initial encounter for closed fracture 07/13/2017   DOE (dyspnea on exertion)     Dyspnea     Fatty liver      mild   Fracture of distal end of right fibula     Gallstones     History of kidney stones     Hypercholesteremia     Hypercholesterolemia     Hypertension     Lightheadedness     Neck pain      due to work    Neuromuscular disorder (HCC)      carpal tunnel bi lat   Prediabetes     Pulmonary nodules     Stress reaction     Stroke (HCC)      2018 no residuals   TIA (transient ischemic attack)      circulation                Past Surgical History:  Procedure Laterality Date   BACK SURGERY   2005    L4-L5 fusion   CHOLECYSTECTOMY N/A 03/02/2020    Procedure: LAPAROSCOPIC CHOLECYSTECTOMY;  Surgeon: Melvenia Stabs, MD;  Location: [facility]S;  Service: General;  Laterality: N/A;   CORONARY ARTERY BYPASS GRAFT N/A 07/29/2018    Procedure: CORONARY ARTERY BYPASS GRAFTING (CABG) x 2;  Surgeon: Hilarie Lovely, MD;  Location: [facility] OR;  Service: Open Heart Surgery;  Laterality: N/A;   CORONARY PRESSURE/FFR STUDY N/A 07/20/2018    Procedure: INTRAVASCULAR PRESSURE WIRE/FFR STUDY;  Surgeon: Lucendia Rusk, MD;  Location: [facility] INVASIVE CV LAB;  Service: Cardiovascular;  Laterality: N/A;   ENDOVEIN HARVEST OF GREATER SAPHENOUS VEIN Left 07/29/2018    Procedure: Endovein Harvest Of Greater Saphenous Vein Left Thigh; Exploration only of right leg greater saphenous vein;  Surgeon: Hilarie Lovely, MD;  Location: [facility] OR;  Service: Open Heart Surgery;  Laterality: Left;   LEFT HEART CATH AND CORONARY ANGIOGRAPHY N/A 07/20/2018    Procedure: LEFT HEART CATH AND CORONARY ANGIOGRAPHY;  Surgeon: Lucendia Rusk, MD;  Location: [facility] INVASIVE CV LAB;  Service: Cardiovascular;  Laterality: N/A;   TEE WITHOUT CARDIOVERSION N/A 07/29/2018     Procedure: TRANSESOPHAGEAL ECHOCARDIOGRAM (TEE);  Surgeon: Hilarie Lovely, MD;  Location: [facility];  Service: Open Heart Surgery;  Laterality: N/A;                Current Outpatient Medications  Medication Sig Dispense Refill   aspirin  EC 81 MG tablet Take 81 mg by mouth daily.       atorvastatin  (LIPITOR ) 80 MG tablet Take 1 tablet by mouth once daily 90 tablet 3   clopidogrel  (PLAVIX ) 75 MG tablet Take 75 mg by mouth daily.       DULoxetine  (CYMBALTA ) 30 MG capsule Take 1 capsule (30 mg total) by mouth daily. 30 capsule 3   ibuprofen  (ADVIL ) 800 MG tablet Take 800 mg by mouth 3 (three) times daily as needed for mild pain or moderate pain.        metoprolol  tartrate (LOPRESSOR ) 25 MG tablet Take 1 tablet by mouth twice daily 180 tablet 3   TRULICITY 0.75 MG/0.5ML SOAJ INJECT 0.75 MG INTO THE SKIN ONCE A WEEK FOR 30 DAYS       empagliflozin (JARDIANCE) 10 MG TABS tablet Take 1 tablet by mouth daily. (Patient not taking: Reported on 05/08/2023)       nitroGLYCERIN  (NITROSTAT ) 0.4 MG SL tablet Place 1 tablet (0.4 mg total) under the tongue every 5 (five) minutes as needed. 25 tablet 3      No current facility-administered medications for this visit.             Allergies as of 05/08/2023 - Review Complete 05/08/2023  Allergen Reaction Noted   Metformin and related Other (See Comments) and Shortness Of Breath 07/13/2018   Metformin hcl   01/31/2020   Codeine Itching 07/03/2021   Oxycodone  Itching 06/07/2018   Oxycodone  hcl Itching 01/31/2020           Family History  Problem Relation Age of Onset   CAD Mother     CAD Father            Social History         Socioeconomic History   Marital status: Married      Spouse name: Not on file   Number of children: 2   Years of education: Not on file   Highest education level: Not on file  Occupational History   Not on file  Tobacco Use   Smoking status: Never   Smokeless tobacco: Never  Vaping Use   Vaping status: Never  Used  Substance and Sexual Activity   Alcohol use: Never   Drug use: Never   Sexual activity: Yes      Comment: married Estate agent)  Other Topics Concern   Not on file  Social History Narrative  Right handed    Drinks caffeine    Two story home    Social Drivers of Health    Financial Resource Strain: Not on file  Food Insecurity: Not on file  Transportation Needs: Not on file  Physical Activity: Not on file  Stress: Not on file  Social Connections: Not on file  Intimate Partner Violence: Not on file      Review of Systems:    Constitutional: No weight loss, fever, chills, weakness or fatigue HEENT: Eyes: No change in vision               Ears, Nose, Throat:  No change in hearing or congestion Skin: No rash or itching Cardiovascular: No chest pain, chest pressure or palpitations   Respiratory: No SOB or cough Gastrointestinal: See HPI and otherwise negative Genitourinary: No dysuria or change in urinary frequency Neurological: No headache, dizziness or syncope Musculoskeletal: No new muscle or joint pain Hematologic: No bleeding or bruising Psychiatric: No history of depression or anxiety      Physical Exam:  Vital signs: BP 130/82   Pulse 74   Ht 5' 9 (1.753 m)   Wt 191 lb (86.6 kg)   BMI 28.21 kg/m    Constitutional: NAD, alert and cooperative Head:  Normocephalic and atraumatic. Eyes:   PEERL, EOMI. No icterus. Conjunctiva pink. Respiratory: Respirations even and unlabored. Lungs clear to auscultation bilaterally.   No wheezes, crackles, or rhonchi.  Cardiovascular:  Regular rate and rhythm. No peripheral edema, cyanosis or pallor.  Gastrointestinal:  Soft, nondistended, tenderness localized to xiphoid process. No rebound or guarding. Normal bowel sounds. No appreciable masses or hepatomegaly. Rectal:  Declines Msk:  Symmetrical without gross deformities. Without edema, no deformity or joint abnormality.  Neurologic:  Alert and  oriented x4;  grossly normal  neurologically.  Skin:   Dry and intact without significant lesions or rashes. Psychiatric: Oriented to person, place and time. Demonstrates good judgement and reason without abnormal affect or behaviors.     RELEVANT LABS AND IMAGING: CBC Labs (Brief)          Component Value Date/Time    WBC 6.8 01/22/2023 0814    RBC 5.48 01/22/2023 0814    HGB 16.1 01/22/2023 0814    HGB 13.4 05/14/2020 1015    HCT 49.1 01/22/2023 0814    HCT 39.3 05/14/2020 1015    PLT 253 01/22/2023 0814    PLT 252 05/14/2020 1015    MCV 89.6 01/22/2023 0814    MCV 91 05/14/2020 1015    MCH 29.4 01/22/2023 0814    MCHC 32.8 01/22/2023 0814    RDW 14.9 01/22/2023 0814    RDW 13.0 05/14/2020 1015    LYMPHSABS 1.6 10/14/2022 1452    MONOABS 0.4 10/14/2022 1452    EOSABS 0.1 10/14/2022 1452    BASOSABS 0.0 10/14/2022 1452        CMP     Labs (Brief)          Component Value Date/Time    NA 132 (L) 01/22/2023 0814    NA 142 04/03/2020 1418    K 3.2 (L) 01/22/2023 0814    CL 102 01/22/2023 0814    CO2 19 (L) 01/22/2023 0814    GLUCOSE 216 (H) 01/22/2023 0814    BUN 19 01/22/2023 0814    BUN 14 04/03/2020 1418    CREATININE 1.16 01/22/2023 0814    CALCIUM  9.5 01/22/2023 0814    PROT 7.5 01/22/2023 8657  ALBUMIN  4.3 01/22/2023 0814    AST 27 01/22/2023 0814    ALT 14 01/22/2023 0814    ALKPHOS 90 01/22/2023 0814    BILITOT 1.4 (H) 01/22/2023 0814    GFRNONAA >60 01/22/2023 0814    GFRAA >60 10/01/2019 2105          Assessment/Plan:    CAD s/p CABG x 2 Seen by cardiology January 2025 with overall unremarkable workup including echo and stress test.  Echocardiogram 11/2022 with EF 60 to 65%.  No further exertional angina chest pain   Epigastric pain Ongoing for years.  More pronounced when bending forward and associated with difficulty breathing.  Not changed with eating.  No improvement with ibuprofen .  No improvement after cholecystectomy.  Tenderness localized to xiphoid process.  No  previous EGD.   CTAP 2021 with partial fatty replacement pancreas, normal lipase.   CT chest with pulmonary nodule Suspect with his pinpoint epigastric pain being localized to the xiphoid process associated with movement he may have costochondritis versus xiphodynia.  Less likely concern for GERD/PUD since no association with eating.   - EGD for further evaluation - I thoroughly discussed the procedure with the patient (at bedside) to include nature of the procedure, alternatives, benefits, and risks (including but not limited to bleeding, infection, perforation, anesthesia/cardiac pulmonary complications).  Patient verbalized understanding and gave verbal consent to proceed with procedure. - If EGD is negative, consider MRCP to evaluate pancreas with previous CT showing fatty replacement -- if MRCP is negative, refer back to PCP for MSK etiology   Colon cancer screening No previous colonoscopy - Schedule colonoscopy - I thoroughly discussed the procedure with the patient (at bedside) to include nature of the procedure, alternatives, benefits, and risks (including but not limited to bleeding, infection, perforation, anesthesia/cardiac pulmonary complications).  Patient verbalized understanding and gave verbal consent to proceed with procedure.    TIA On Plavix  Hold Plavix  x 5 days - Will hold Plavix  5 days prior to endoscopic procedures - will instruct when and how to resume after procedure. Benefits and risks of procedure explained including risks of bleeding, perforation, infection, missed lesions, reactions to medications and possible need for hospitalization and surgery for complications. Additional rare but real risk of stroke or other vascular clotting events off Plavix  also explained and need to seek urgent help if any signs of these problems occur. Will communicate by phone or EMR with patient's  prescribing provider to confirm that holding Plavix  is reasonable in this case.       Suzanna Erp, PA-C   Attending physician's note   I have taken history, reviewed the chart and examined the patient. I performed a substantive portion of this encounter, including complete performance of at least one of the key components, in conjunction with the APP. I agree with the Advanced Practitioner's note, impression and recommendations.   EGD/colon  Plavix  on hold x 5 dats   Magnus Schuller, MD [facility] [phone]

## 2023-06-26 NOTE — Progress Notes (Signed)
 Called to room to assist during endoscopic procedure.  Patient ID and intended procedure confirmed with present staff. Received instructions for my participation in the procedure from the performing physician.

## 2023-06-26 NOTE — Op Note (Addendum)
 [facility] Patient Name: Tom Phelps Procedure Date: 06/26/2023 2:43 PM MRN: [id number] Endoscopist: Lajuan Pila , MD, [id number] Age: 63 Referring MD:  Date of Birth: 1960-10-26 Gender: Male Account #: [id number] Procedure:                Upper GI endoscopy Indications:              Epigastric abdominal pain Medicines:                Monitored Anesthesia Care Procedure:                Pre-Anesthesia Assessment:                           - Prior to the procedure, a History and Physical                            was performed, and patient medications and                            allergies were reviewed. [name] tolerance of                            previous anesthesia was also reviewed. The risks                            and benefits of the procedure and the sedation                            options and risks were discussed with the patient.                            All questions were answered, and informed consent                            was obtained. Prior Anticoagulants: Plavix  was                            added 5 days prior to the procedure. ASA Grade                            Assessment: III - A patient with severe systemic                            disease. After reviewing the risks and benefits,                            the patient was deemed in satisfactory condition to                            undergo the procedure.                           After obtaining informed consent, the endoscope was  passed under direct vision. Throughout the                            procedure, the patient's blood pressure, pulse, and                            oxygen saturations were monitored continuously. The                            Olympus scope [id number] was introduced through the                            mouth, and advanced to the second part of duodenum.                            The upper GI endoscopy was accomplished  without                            difficulty. The patient tolerated the procedure                            well. Scope In: Scope Out: Findings:                 Few Patchy, white plaques were found in the lower                            third of the esophagus. Biopsies were taken with a                            cold forceps for histology to r/o Candida                            esophagitis.                           The Z-line was regular and was found 35 cm from the                            incisors.                           A small hiatal hernia was present. Best visualized                            on Valsalva.                           One non-bleeding cratered gastric ulcer with no                            stigmata of bleeding was found in the gastric                            antrum with surrounding mild gastritis. The lesion  was 10 mm in largest dimension. Biopsies were taken                            with a cold forceps for histology.                           One non-bleeding cratered gastric ulcer with no                            stigmata of bleeding was found in the gastric                            fundus. The lesion was 8 mm in largest dimension.                            Biopsies were taken with a cold forceps for                            histology.                           A wide open acquired benign-appearing, intrinsic                            mild stenosis was found in the D1/D2 jn duodenum                            and was traversed. No ulcers or erosions. The                            duodenal mucosa was normal otherwise. Complications:            No immediate complications. Estimated Blood Loss:     Estimated blood loss: none. Impression:               - Esophageal plaques were found, suspicious for                            candidiasis. Biopsied.                           - Small hiatal hernia.                            - Non-bleeding gastric ulcer with no stigmata of                            bleeding. Biopsied.                           - Non-bleeding gastric ulcer with no stigmata of                            bleeding. Biopsied. Recommendation:           - Patient has a contact number available for  emergencies. The signs and symptoms of potential                            delayed complications were discussed with the                            patient. Return to normal activities tomorrow.                            Written discharge instructions were provided to the                            patient.                           - Resume previous diet.                           - Start Protonix  40 mg p.o. daily #90 4RF. Please                            take it half an hour before breakfast                           - Continue present medications. Avoid nonsteroidals                            except baby aspirin                            - Resume Plavix  6/21                           - Await pathology results.                           - Recommend repeat EGD in 12 weeks to ensure                            healing.                           - The findings and recommendations were discussed                            with the patient's family. Lajuan Pila, MD 06/26/2023 3:14:54 PM This report has been signed electronically.

## 2023-06-26 NOTE — Progress Notes (Signed)
 Report to PACU, RN, vss, BBS= Clear.

## 2023-06-26 NOTE — Patient Instructions (Addendum)
 - Restart Plavix   06/27/23 -Handout on hiatal hernia, gastric ulcer provided. -await pathology results. -repeat colonoscopy in 10  years for surveillance recommended.  -Continue present medications. Start Pantoprazole  40 mg daily. Take 30 minutes before breakfast -Call in July to schedule a repeat EGD for September   YOU HAD AN ENDOSCOPIC PROCEDURE TODAY AT THE [facility]:   Refer to the procedure report that was given to you for any specific questions about what was found during the examination.  If the procedure report does not answer your questions, please call your gastroenterologist to clarify.  If you requested that your care partner not be given the details of your procedure findings, then the procedure report has been included in a sealed envelope for you to review at your convenience later.  YOU SHOULD EXPECT: Some feelings of bloating in the abdomen. Passage of more gas than usual.  Walking can help get rid of the air that was put into your GI tract during the procedure and reduce the bloating. If you had a lower endoscopy (such as a colonoscopy or flexible sigmoidoscopy) you may notice spotting of blood in your stool or on the toilet paper. If you underwent a bowel prep for your procedure, you may not have a normal bowel movement for a few days.  Please Note:  You might notice some irritation and congestion in your nose or some drainage.  This is from the oxygen used during your procedure.  There is no need for concern and it should clear up in a day or so.  SYMPTOMS TO REPORT IMMEDIATELY:  Following lower endoscopy (colonoscopy or flexible sigmoidoscopy):  Excessive amounts of blood in the stool  Significant tenderness or worsening of abdominal pains  Swelling of the abdomen that is new, acute  Fever of 100F or higher  Following upper endoscopy (EGD)  Vomiting of blood or coffee ground material  New chest pain or pain under the shoulder blades  Painful or  persistently difficult swallowing  New shortness of breath  Fever of 100F or higher  Black, tarry-looking stools  For urgent or emergent issues, a gastroenterologist can be reached at any hour by calling [phone]. Do not use MyChart messaging for urgent concerns.    DIET:  We do recommend a small meal at first, but then you may proceed to your regular diet.  Drink plenty of fluids but you should avoid alcoholic beverages for 24 hours.  ACTIVITY:  You should plan to take it easy for the rest of today and you should NOT DRIVE or use heavy machinery until tomorrow (because of the sedation medicines used during the test).    FOLLOW UP: Our staff will call the number listed on your records the next business day following your procedure.  We will call around 7:15- 8:00 am to check on you and address any questions or concerns that you may have regarding the information given to you following your procedure. If we do not reach you, we will leave a message.     If any biopsies were taken you will be contacted by phone or by letter within the next 1-3 weeks.  Please call us  at [phone] if you have not heard about the biopsies in 3 weeks.    SIGNATURES/CONFIDENTIALITY: You and/or your care partner have signed paperwork which will be entered into your electronic medical record.  These signatures attest to the fact that that the information above on your After Visit Summary has been reviewed  and is understood.  Full responsibility of the confidentiality of this discharge information lies with you and/or your care-partner.

## 2023-06-26 NOTE — Op Note (Signed)
 [facility] Patient Name: Tom Phelps Procedure Date: 06/26/2023 2:35 PM MRN: [id number] Endoscopist: Lajuan Pila , MD, [id number] Age: 63 Referring MD:  Date of Birth: 1960/10/13 Gender: Male Account #: [id number] Procedure:                Colonoscopy Indications:              Screening for colorectal malignant neoplasm Medicines:                Monitored Anesthesia Care Procedure:                Pre-Anesthesia Assessment:                           - Prior to the procedure, a History and Physical                            was performed, and patient medications and                            allergies were reviewed. [name] tolerance of                            previous anesthesia was also reviewed. The risks                            and benefits of the procedure and the sedation                            options and risks were discussed with the patient.                            All questions were answered, and informed consent                            was obtained. Prior Anticoagulants: The patient has                            taken Plavix  (clopidogrel ), last dose was 5 days                            prior to procedure. ASA Grade Assessment: III - A                            patient with severe systemic disease. After                            reviewing the risks and benefits, the patient was                            deemed in satisfactory condition to undergo the                            procedure.  After obtaining informed consent, the colonoscope                            was passed under direct vision. Throughout the                            procedure, the patient's blood pressure, pulse, and                            oxygen saturations were monitored continuously. The                            Olympus Scope [id number] was introduced through the                            anus and advanced to the 2 cm into the ileum.  The                            colonoscopy was performed without difficulty. The                            patient tolerated the procedure well. The quality                            of the bowel preparation was good. The terminal                            ileum, ileocecal valve, appendiceal orifice, and                            rectum were photographed. Scope In: 3:01:32 PM Scope Out: 3:09:38 PM Scope Withdrawal Time: 0 hours 5 minutes 33 seconds  Total Procedure Duration: 0 hours 8 minutes 6 seconds  Findings:                 Rare small-mouthed diverticula were found in the                            sigmoid colon.                           Non-bleeding internal hemorrhoids were found during                            retroflexion. The hemorrhoids were small and Grade                            I (internal hemorrhoids that do not prolapse).                           The terminal ileum appeared normal.                           Retroflexion in the right colon was performed.  The exam was otherwise without abnormality on                            direct and retroflexion views. Complications:            No immediate complications. Estimated Blood Loss:     Estimated blood loss: none. Impression:               - Very minimal sigmoid diverticulosis.                           - Non-bleeding internal hemorrhoids.                           - The examined portion of the ileum was normal.                           - The examination was otherwise normal on direct                            and retroflexion views.                           - No specimens collected. Recommendation:           - Patient has a contact number available for                            emergencies. The signs and symptoms of potential                            delayed complications were discussed with the                            patient. Return to normal activities tomorrow.                             Written discharge instructions were provided to the                            patient.                           - Resume previous diet.                           - Resume Plavix  (clopidogrel ) at prior dose as per                            EGD note.                           - Repeat colonoscopy in 10 years for screening                            purposes. Earlier, if any new problems or change in  family history.                           - The findings and recommendations were discussed                            with the patient's family. Lajuan Pila, MD 06/26/2023 3:17:45 PM This report has been signed electronically.

## 2023-06-29 ENCOUNTER — Telehealth: Payer: Self-pay | Admitting: *Deleted

## 2023-06-29 NOTE — Telephone Encounter (Signed)
  Follow up Call-     06/26/2023    2:18 PM  Call back number  Post procedure Call Back phone  # [phone]  Permission to leave phone message Yes    Patient questions:  Do you have a fever, pain , or abdominal swelling? No. Pain Score  0 *  Have you tolerated food without any problems? Yes.    Have you been able to return to your normal activities? Yes.    Do you have any questions about your discharge instructions: Diet   No. Medications  No. Follow up visit  No.  Do you have questions or concerns about your Care? Yes.   All questions answered about ulcer to pt's satisfaction Actions: * If pain score is 4 or above: No action needed, pain <4.

## 2023-07-02 LAB — SURGICAL PATHOLOGY

## 2023-07-06 ENCOUNTER — Telehealth: Payer: Self-pay

## 2023-07-06 NOTE — Telephone Encounter (Signed)
   Patient Name: Tom Phelps  DOB: August 16, 1960 MRN: [id number]  Primary Cardiologist: Madonna Large, DO  Chart reviewed as part of pre-operative protocol coverage.   Patient can hold Plavix  5 days prior to procedure and should restart postprocedure when surgically safe and hemostasis is achieved.   Wyn Raddle, Jackee Shove, NP 07/06/2023, 3:41 PM

## 2023-07-06 NOTE — Telephone Encounter (Signed)
 I'm sorry I meant to send this to the preop pool Dr Michele [facility] Pre-operative Risk Assessment     Request for surgical clearance:     Endoscopy Procedure  What type of surgery is being performed?     EGD   When is this surgery scheduled?     09-30-23  What type of clearance is required ?   Pharmacy  Are there any medications that need to be held prior to surgery and how long? Plavix  5 days prior  Practice name and name of physician performing surgery?      [facility]  What is your office phone and fax number?      Phone- [phone]  Fax- [phone]  Anesthesia type (None, local, MAC, general) ?       MAC   Please route your response to [employer] CMA, (AAMA)

## 2023-07-06 NOTE — Telephone Encounter (Signed)
 Dr Michele   This patient is having another EGD on September 30, 2023  with Dr Charlanne and we are asking if he can be cleared to hold his Plavix  5 days prior to the procedure. Please advise as soon as you can. Thank you for your time

## 2023-07-07 ENCOUNTER — Encounter: Payer: Self-pay | Admitting: Cardiology

## 2023-07-08 NOTE — Telephone Encounter (Signed)
 Given his cardiovascular history would recommend continuing on Plavix , statin therapy, Jardiance, metoprolol . Its risk versus benefit. I would ask him to discuss further with GI to see if other factors could be contributing to his ulcers (there are many types of ulcers).  He can speak to his GI with regards to cardiac medications and its contributing factors to his underlying GI issues. Avoid prolonged use of NSAIDs.  Khaleb Broz [employer], DO, FACC

## 2023-07-08 NOTE — Telephone Encounter (Signed)
 Agree, may hold Plavix  5 days prior to his endoscopy and restart when hemodynamically stable and appropriate hemostasis was achieved.  Burgandy Hackworth Od[place], DO, FACC

## 2023-07-08 NOTE — Telephone Encounter (Signed)
 Thank you :)

## 2023-07-09 ENCOUNTER — Ambulatory Visit: Payer: Self-pay | Admitting: Gastroenterology

## 2023-07-09 NOTE — Progress Notes (Signed)
 Please inform the patient. Positive H. pylori gastritis, will give Quad therapy with medication listed below for 14 days   -bismuth subsalicylate 262 mg 2 tabs 4 times daily (#112)  -metronidazole 500mg  BID (#28) -doxycyline 100mg  BID (#28) -pantoprazole  40 mg twice daily.  Then continue Protonix  40 mg p.o. daily I recommend that the patient abstain from all alcohol while taking metronidazole.   To avoid side effects of metronidazole, I recommend that she stick to simple meals and not eat rich or spicy food.  She should always try to take your metronidazole after a meal or snack. Most common side effects include nausea, vomiting, diarrhea, stomach upset and rash.    Eight weeks after completing the treatment, an H pylori stool antigen/Diatherix should be performed to monitor for response to treatment.    Send report to family physician

## 2023-07-13 ENCOUNTER — Other Ambulatory Visit: Payer: Self-pay

## 2023-07-13 DIAGNOSIS — B9681 Helicobacter pylori [H. pylori] as the cause of diseases classified elsewhere: Secondary | ICD-10-CM

## 2023-07-13 MED ORDER — BISMUTH SUBSALICYLATE 262 MG PO TABS
524.0000 mg | ORAL_TABLET | Freq: Four times a day (QID) | ORAL | 0 refills | Status: AC
Start: 2023-07-13 — End: 2023-07-27

## 2023-07-13 MED ORDER — METRONIDAZOLE 500 MG PO TABS: 500.0000 mg | ORAL_TABLET | Freq: Two times a day (BID) | ORAL | 0 refills | Status: AC

## 2023-07-13 MED ORDER — DOXYCYCLINE HYCLATE 100 MG PO CAPS: 100.0000 mg | ORAL_CAPSULE | Freq: Two times a day (BID) | ORAL | 0 refills | Status: AC

## 2023-07-16 ENCOUNTER — Ambulatory Visit: Admitting: Pulmonary Disease

## 2023-08-14 ENCOUNTER — Telehealth: Payer: Self-pay

## 2023-08-14 NOTE — Telephone Encounter (Signed)
 Adding Dr. Charlanne to this string, this is his patient

## 2023-08-14 NOTE — Telephone Encounter (Signed)
 In preparing chart information for patient's upcoming EGD scheduled for 09/22/23, RN observed that patient was seen by Dr. Charlanne (6/20) for a colonoscopy. Patient has been taking clopidogrel  (Plavix ) since 08/2018 and is still taking this medication, which warrants cardiac clearance prior to having the EGD.  RN communicated with MD RN to obtain this clearance so that patient can proceed with EGD on 09/22/23.

## 2023-08-19 NOTE — Telephone Encounter (Signed)
 OK to proceed with EGD RG

## 2023-09-03 ENCOUNTER — Ambulatory Visit (AMBULATORY_SURGERY_CENTER)

## 2023-09-03 VITALS — Ht 69.0 in | Wt 182.0 lb

## 2023-09-03 DIAGNOSIS — Z8619 Personal history of other infectious and parasitic diseases: Secondary | ICD-10-CM

## 2023-09-03 DIAGNOSIS — R109 Unspecified abdominal pain: Secondary | ICD-10-CM

## 2023-09-03 NOTE — Progress Notes (Signed)
 No egg or soy allergy known to patient  No issues known to pt with past sedation with any surgeries or procedures Patient denies ever being told they had issues or difficulty with intubation  No FH of Malignant Hyperthermia Pt is not on diet pills Pt is not on  home 02   Pt is on blood thinners; Takes Plavix  ; 5 day hold instructions provided   Pt denies issues with constipation  No A fib or A flutter Have any cardiac testing pending--No Pt can ambulate  Pt denies use of chewing tobacco Discussed diabetic I weight loss medication holds Discussed NSAID holds Checked BMI Pt instructed to use Singlecare.com or GoodRx for a price reduction on prep  Patient's chart reviewed by Norleen Schillings CNRA prior to previsit and patient appropriate for the [facility].  Pre visit completed and red dot placed by patient's name on their procedure day (on provider's schedule).

## 2023-09-04 ENCOUNTER — Encounter: Payer: Self-pay | Admitting: Gastroenterology

## 2023-09-09 NOTE — Progress Notes (Signed)
 ------------------------------------------------------------------------------- Summary: Impressions -------------------------------------------------------------------------------  [facility] 09/09/23 No primary care provider on file. Treatment Providers Alan Daring Kotis, DMD  Dental procedures in this visit  . I0000 - FINAL IMPRESSION (Completed)    Service provider: Alan Daring Fairly, DMD    Billing provider: Alan Daring Kotis, DMD    HEALTH HISTORY ? Vitals:  BP Readings from Last 1 Encounters:  09/09/23 (!) 144/81    Pulse:  (!) 49 Medical history was reviewed and updated. No contraindication to care. Medical History[1] Surgical History[2] Social History   Tobacco Use  . Smoking status: Never  . Smokeless tobacco: Never  Substance Use Topics  . Alcohol use: Never   Family History[3] Medications Ordered Prior to Encounter[4] Medical Risk Assessment ASA GRADE: ASA 2 - Patient with mild systemic disease with no functional limitations  Subjective: Patient reports no change to condition since last visit. Max partially edentulous and Mand partially edentulous pt presents to clinic for bite relation/tooth selection of acrylic partial- mand and max CD  Objective:  All tissues appear healthy and wnl.  Assessment: Will do fillings at next appt. with Carbocaine.   Plan: Pause to confirm correct patient, correct area for treatment, and confirm diagnosis for treatment.  yes Establishment and confirmation of facial contour, occlusal plane position, and occlusal vertical dimension (adequate freeway space, speech pattern, lip line). Mold/shape: N/a Shade: will pick ty next visit Discussed with patient risks and precautions. Necessary consent were obtained from patient.  Additional notes: None  NV: bite registration/tooth selection  Treatment Providers Dental Assistant: Pamila Bergeron, DA II Alan Daring Fairly, DMD [facility] [address] [place] [id number] [phone]       [1] Past Medical History: Diagnosis Date  . Cardiovascular disease   . Diabetes mellitus (CMS/HCC)   . History of carotid artery stenosis   . History of stroke   . Hypertension   . S/P thoracentesis 09/09/2023  [2] Past Surgical History: Procedure Laterality Date  . CARDIAC SURGERY  2020   Bypass  [3] No family history on file. [4] Current Outpatient Medications on File Prior to Visit  Medication Sig Dispense Refill  . aspirin  81 mg chewable tablet Chew 81 mg 1 (one) time each day.    . aspirin  81 mg tablet 1 (one) time each day at the same time.    . atorvastatin  (LIPITOR ) 40 mg tablet Take 80 mg by mouth 1 (one) time each day.    . atorvastatin  (LIPITOR ) 80 mg tablet Take 80 mg by mouth 1 (one) time each day.    . blood-glucose meter (OneTouch Verio Reflect Meter) misc Monitor blood sugar once a day , Dx E11.69; Duration: 99 days    . clopidogreL  (PLAVIX ) 75 mg tablet Take 75 mg by mouth in the morning.    . escitalopram (LEXAPRO) 10 mg tablet Take 10 mg by mouth 1 (one) time each day.    SABRA glucose blood (OneTouch Verio test strips) test strip USE TO CHECK BLOOD SUGAR ONCE DAILY    . meloxicam  (MOBIC ) 15 mg tablet Take 1 tablet by mouth 1 (one) time each day.    . metoprolol  succinate (TOPROL -XL) 25 mg 24 hr tablet Take by mouth in the morning.    . metoprolol  tartrate (LOPRESSOR ) 25 mg tablet Take 25 mg by mouth in the morning and at bedtime.    . nitroglycerin  (NITROSTAT ) 0.4 mg SL tablet Place 0.4 mg under the  tongue. (Patient taking differently: Place 0.4 mg under the tongue. As needed)    . OneTouch Delica Plus Lancet 30 gauge misc USE TO CHECK BLOOD SUGAR FINGER STICK ONCE DAILY    . OneTouch Verio test strips test strip USE TO CHECK BLOOD SUGAR ONCE DAILY    . traMADoL  (ULTRAM ) 50 mg tablet Take by mouth.    . aspirin  81 MG oral suspension     . aspirin  81 mg  tablet Take 81 mg by mouth in the morning.    . doxycycline  (VIBRAMYCIN ) 100 mg capsule Take 1 capsule by mouth in the morning and at bedtime. (Patient not taking: Reported on 09/09/2023)    . DULoxetine  (CYMBALTA ) 30 mg DR capsule Take 30 mg by mouth. (Patient not taking: Reported on 09/09/2023)    . empagliflozin 10 mg tablet Take 1 tablet by mouth 1 (one) time each day.    . ibuprofen  (ADVIL ,MOTRIN ) 800 mg tablet Take 800 mg by mouth. (Patient not taking: Reported on 09/09/2023)    . metoprolol  tartrate (LOPRESSOR ) 25 mg tablet Take 25 mg by mouth in the morning and at bedtime. (Patient not taking: Reported on 09/09/2023)    . metroNIDAZOLE  (FLAGYL ) 500 mg tablet Take 1 tablet by mouth in the morning and at bedtime. (Patient not taking: Reported on 09/09/2023)    . pantoprazole  (PROTONIX ) 40 mg EC tablet Take 40 mg by mouth. (Patient not taking: Reported on 09/09/2023)    . sodium,potassium,mag sulfates 17.5-3.13-1.6 gram recon soln TAKE 1 KIT BY MOUTH ONCE FOR 1 DOSE (Patient not taking: Reported on 09/09/2023)    . Trulicity 0.75 mg/0.5 mL pen injector INJECT 0.75 MG INTO THE SKIN ONCE A WEEK FOR 30 DAYS (Patient not taking: Reported on 09/09/2023)     No current facility-administered medications on file prior to visit.

## 2023-09-18 ENCOUNTER — Ambulatory Visit: Attending: Cardiology | Admitting: Cardiology

## 2023-09-18 ENCOUNTER — Encounter: Payer: Self-pay | Admitting: Cardiology

## 2023-09-18 VITALS — BP 129/76 | HR 60 | Resp 16 | Ht 69.0 in | Wt 181.0 lb

## 2023-09-18 DIAGNOSIS — E1159 Type 2 diabetes mellitus with other circulatory complications: Secondary | ICD-10-CM

## 2023-09-18 DIAGNOSIS — Z951 Presence of aortocoronary bypass graft: Secondary | ICD-10-CM | POA: Diagnosis not present

## 2023-09-18 DIAGNOSIS — Z8673 Personal history of transient ischemic attack (TIA), and cerebral infarction without residual deficits: Secondary | ICD-10-CM

## 2023-09-18 DIAGNOSIS — E782 Mixed hyperlipidemia: Secondary | ICD-10-CM

## 2023-09-18 DIAGNOSIS — Z794 Long term (current) use of insulin: Secondary | ICD-10-CM

## 2023-09-18 DIAGNOSIS — I10 Essential (primary) hypertension: Secondary | ICD-10-CM

## 2023-09-18 DIAGNOSIS — I25810 Atherosclerosis of coronary artery bypass graft(s) without angina pectoris: Secondary | ICD-10-CM

## 2023-09-18 MED ORDER — NITROGLYCERIN 0.4 MG SL SUBL: 0.4000 mg | SUBLINGUAL_TABLET | SUBLINGUAL | 6 refills | Status: AC | PRN

## 2023-09-18 NOTE — Patient Instructions (Signed)
 Medication Instructions:  Your physician has recommended you make the following change in your medication: Stop aspirin   *If you need a refill on your cardiac medications before your next appointment, please call your pharmacy*  Lab Work: none If you have labs (blood work) drawn today and your tests are completely normal, you will receive your results only by: MyChart Message (if you have MyChart) OR A paper copy in the mail If you have any lab test that is abnormal or we need to change your treatment, we will call you to review the results.  Testing/Procedures: none  Follow-Up: At [facility], you and your health needs are our priority.  As part of our continuing mission to provide you with exceptional heart care, our providers are all part of one team.  This team includes your primary Cardiologist (physician) and Advanced Practice Providers or APPs (Physician Assistants and Nurse Practitioners) who all work together to provide you with the care you need, when you need it.  Your next appointment:   6 month(s)  Provider:   Madonna Large, DO    We recommend signing up for the patient portal called MyChart.  Sign up information is provided on this After Visit Summary.  MyChart is used to connect with patients for Virtual Visits (Telemedicine).  Patients are able to view lab/test results, encounter notes, upcoming appointments, etc.  Non-urgent messages can be sent to your provider as well.   To learn more about what you can do with MyChart, go to [email or web address].   Other Instructions

## 2023-09-18 NOTE — Progress Notes (Signed)
 Cardiology Office Note:  .   Date:  09/18/2023  ID:  Tom Phelps, DOB [birth year], MRN [id number] PCP:  Verena Mems, MD  Former Cardiology Providers: Dr. Candyce Reek [facility] Providers Cardiologist:  Madonna Large, DO , [facility] (established care 01/28/23) Electrophysiologist:  None  Click to update primary MD,subspecialty MD or APP then REFRESH:1}    Chief Complaint  Patient presents with   Follow-up    6 month follow up CAD and CABG    History of Present Illness: .   Tom Phelps is a 63 y.o. [place] descent male whose past medical history and cardiovascular risk factors includes: CAD s/p CABG x2 with LIMA to the LAD and RSVG to OM 2., DM type II, HLD, anxiety, TIA 2018.   Formally under the care of Dr. Candyce Reek and transitioned to my practice in January 2025.  In June 2020 patient was experiencing exertional chest pain and shortness of breath.  He underwent coronary CTA which noted obstructive disease as per CT FFR underwent angiography.  He was noted to have severe two-vessel diffuse disease was referred to CABG.  He underwent CABG x 2 in July 2020 with LIMA to the LAD and reverse SVG to OM2.  In October 2024 patient was endorsing precordial pain and at that time he underwent stress test which was reported to be low risk and echocardiogram continued to illustrate preserved LVEF without any significant valvular heart disease.    In the past patient was endorsing epigastric discomfort more pronounced with bending forward.  The discomfort has remained despite undergoing cholecystectomy.  Since his cardiovascular workup was unremarkable at the last office visit patient was planning to follow-up with GI and pulmonary medicine for further evaluation and management.  Patient did undergo EGD and colonoscopy in June 2025.  According to the report he had esophageal plaques suspicious for candidiasis and nonbleeding gastric ulcers which were biopsied.  Patient was recommended to  start Protonix , avoid NSAIDs except baby aspirin . In the interim patient was planning to undergo right carpal tunnel release and right cubital tunnel release with [facility] but plans to hold off.   Patient denies anginal chest pain or anginal equivalents that he experienced back in October 2024. Since taking Protonix  or his heartburn has improved but when he still bends forward still has discomfort at the xiphoid process. Still continues to work part-time at [employer] which usually involves walking 2 to 3 miles with his job duties and does not have any exertional symptoms.  And he walks an additional mile outside work and remains asymptomatic.  No need for sublingual nitroglycerin  tablet.  Denies heart failure symptoms.  Patient is scheduled for repeat endoscopy in the coming weeks.  Review of Systems: .   Review of Systems  Cardiovascular:  Positive for chest pain (At the xiphoid process.). Negative for claudication, irregular heartbeat, leg swelling, near-syncope, orthopnea, palpitations, paroxysmal nocturnal dyspnea and syncope.  Respiratory:  Negative for shortness of breath.   Hematologic/Lymphatic: Negative for bleeding problem.    Studies Reviewed:   EKG: EKG Interpretation Date/Time:  Friday September 18 2023 14:11:05 EDT Ventricular Rate:  57 PR Interval:  164 QRS Duration:  86 QT Interval:  422 QTC Calculation: 410 R Axis:   10  Text Interpretation: Sinus bradycardia When compared with ECG of 28-Jan-2023 11:20, Nonspecific T wave abnormality, improved in Anterior leads Sinus bradycardia has replaced Sinus rhythm Confirmed by Large Madonna [phone]) on 09/18/2023 2:33:27 PM  Echocardiogram: 11/26/2022 60 to 65%, normal regional wall  motion abnormalities, mild LVH. Normal diastolic function. Right ventricular size and function normal. Trivial MR Estimated RAP 3 mmHg See report for additional details  Stress Testing: MYOCARDIAL PERFUSION IMAGING 11/26/2022   Narrative    Findings are consistent with no ischemia. The study is low risk.   No ST deviation was noted.   Left ventricular function is normal. Nuclear stress EF: 68%. The left ventricular ejection fraction is hyperdynamic (>65%). End diastolic cavity size is normal. End systolic cavity size is normal.   No ischemia. LVEF 68% with normal wall motion. Mild RV tracer uptake noted, suggestive of possible pulmonary hypertension. This is a low risk study. No prior for comparison.  RADIOLOGY: N/A  Risk Assessment/Calculations:   N/A   Labs:       Latest Ref Rng & Units 01/22/2023    8:14 AM 10/14/2022    2:52 PM 05/14/2020   10:15 AM  CBC  WBC 4.0 - 10.5 K/uL 6.8  6.9  5.6   Hemoglobin 13.0 - 17.0 g/dL 83.8  86.4  86.5   Hematocrit 39.0 - 52.0 % 49.1  41.3  39.3   Platelets 150 - 400 K/uL 253  251.0  252        Latest Ref Rng & Units 01/22/2023    8:14 AM 04/03/2020    2:18 PM 02/21/2020    1:53 PM  BMP  Glucose 70 - 99 mg/dL 783  862  739   BUN 8 - 23 mg/dL 19  14  25    Creatinine 0.61 - 1.24 mg/dL 8.83  8.89  8.70   BUN/Creat Ratio 10 - 24  13    Sodium 135 - 145 mmol/L 132  142  136   Potassium 3.5 - 5.1 mmol/L 3.2  4.2  4.8   Chloride 98 - 111 mmol/L 102  104  102   CO2 22 - 32 mmol/L 19  22  24    Calcium  8.9 - 10.3 mg/dL 9.5  9.5  9.7       Latest Ref Rng & Units 01/22/2023    8:14 AM 10/14/2022    2:52 PM 04/03/2020    2:18 PM  CMP  Glucose 70 - 99 mg/dL 783   862   BUN 8 - 23 mg/dL 19   14   Creatinine 9.38 - 1.24 mg/dL 8.83   8.89   Sodium 864 - 145 mmol/L 132   142   Potassium 3.5 - 5.1 mmol/L 3.2   4.2   Chloride 98 - 111 mmol/L 102   104   CO2 22 - 32 mmol/L 19   22   Calcium  8.9 - 10.3 mg/dL 9.5   9.5   Total Protein 6.5 - 8.1 g/dL 7.5  6.5    Total Bilirubin 0.0 - 1.2 mg/dL 1.4  0.8    Alkaline Phos 38 - 126 U/L 90  93    AST 15 - 41 U/L 27  16    ALT 0 - 44 U/L 14  8      No results found for: CHOL, HDL, LDLCALC, LDLDIRECT, TRIG, CHOLHDL No results for  input(s): LIPOA in the last 8760 hours. No components found for: NTPROBNP No results for input(s): PROBNP in the last 8760 hours. No results for input(s): TSH in the last 8760 hours.  External Labs: Collected: 09/26/2022 [facility] database. Total cholesterol 125, triglycerides 81, HDL 43, LDL calculated 66. TSH 1.42.  Collected 12/11/2022: A1c 7.0  Physical Exam:    Today's Vitals  09/18/23 1408  BP: 129/76  Pulse: 60  Resp: 16  SpO2: 95%  Weight: 181 lb (82.1 kg)  Height: 5' 9 (1.753 m)   Body mass index is 26.73 kg/m. Wt Readings from Last 3 Encounters:  09/18/23 181 lb (82.1 kg)  09/03/23 182 lb (82.6 kg)  06/26/23 191 lb (86.6 kg)    Physical Exam  Constitutional: No distress.  hemodynamically stable  Neck: No JVD present.  Cardiovascular: Normal rate, regular rhythm, S1 normal and S2 normal. Exam reveals no gallop, no S3 and no S4.  No murmur heard. Pulmonary/Chest: Effort normal and breath sounds normal. No stridor. He has no wheezes. He has no rales.  Sternotomy site is well-healed, no tenderness to palpation at the xiphoid process, no rashes or wounds noted.  Abdominal: Soft. Bowel sounds are normal. He exhibits no distension. There is no abdominal tenderness.  Musculoskeletal:        General: No edema.     Cervical back: Neck supple.  Neurological: He is alert and oriented to person, place, and time. He has intact cranial nerves (2-12).  Skin: Skin is warm.     Impression & Recommendation(s):  Impression:   ICD-10-CM   1. Coronary artery disease involving coronary bypass graft of native heart without angina pectoris  I25.810 EKG 12-Lead    nitroGLYCERIN  (NITROSTAT ) 0.4 MG SL tablet    2. S/P CABG x 2  Z95.1 nitroGLYCERIN  (NITROSTAT ) 0.4 MG SL tablet    3. Essential hypertension  I10     4. Mixed hyperlipidemia  E78.2     5. Type 2 diabetes mellitus with other circulatory complication, with long-term current use of insulin  (HCC)  E11.59    Z79.4      6. History of TIA (transient ischemic attack)  Z86.73        Recommendation(s):  Coronary artery disease involving coronary bypass graft of native heart without angina pectoris (HCC) S/P CABG x 2 Denies anginal chest pain. Endorses epigastric discomfort when bending forward  Symptoms have improved since Protonix  but still present  Did undergo endoscopy which noted non bleeding ulcers he has a repeat study coming up.   Patient requesting a refill for sublingual nitroglycerin  tablets. Continue Plavix  75 mg p.o. daily given his underlying CAD. Stop aspirin  81 mg p.o. daily. Patient has also reduced the use of NSAIDs for pain after his recent endoscopy. Continue atorvastatin  80 mg p.o. nightly and Zetia 10 mg p.o. daily. Continue to monitor symptoms if the increase in intensity frequency or duration or he has anginal equivalents additional ischemic workup could be considered.  Patient would like to hold off for now.  Epigastric pain See above Plans to follow-up with GI for repeat endoscopy as the prior study noted nonbleeding ulcers No longer uses ibuprofen  800 mg p.o. 3 times daily and as needed basis for pain He uses Tylenol  for pain control if needed. Continue Protonix  Continue to follow-up with GI  Essential hypertension Office blood pressures are well-controlled. Currently on Lopressor  25 mg p.o. twice daily Continue Jardiance 10 mg p.o. daily Reemphasized importance of low-salt diet.  Mixed hyperlipidemia Currently on atorvastatin  80 mg p.o. daily and Zetia 10 mg p.o. daily.   He denies myalgia or other side effects. Most recent lipids dated September 2024, independently reviewed as noted above.  LDL is 66 mg/dL. Recommend a goal LDL <55 mg/dL Cardiology is following peripherally.  Type 2 diabetes mellitus with other circulatory complication, with long-term current use of insulin  [facility]) December 2024-A1c  7.0. March 2025 A1c 6.9% Reemphasized importance of glycemic  control. Currently on Jardiance, Lipitor  Consider low-dose ACE inhibitor/ARB for renal protection, defer to PCP.  As part of today's office visit discussed management related to chronic comorbid conditions. Reviewed last progress note from gastroenterology. Reviewed the reports for upper and lower endoscopy. EKG independently reviewed 09/18/2023. Labs 03/26/2023 reviewed from [facility] database Prescription drug management  Orders Placed:  Orders Placed This Encounter  Procedures   EKG 12-Lead    Final Medication List:    Meds ordered this encounter  Medications   nitroGLYCERIN  (NITROSTAT ) 0.4 MG SL tablet    Sig: Place 1 tablet (0.4 mg total) under the tongue every 5 (five) minutes as needed.    Dispense:  25 tablet    Refill:  6    Medications Discontinued During This Encounter  Medication Reason   TRULICITY 0.75 MG/0.5ML SOAJ Patient Preference   DULoxetine  (CYMBALTA ) 30 MG capsule Patient Preference   aspirin  EC 81 MG tablet    nitroGLYCERIN  (NITROSTAT ) 0.4 MG SL tablet Reorder     Current Outpatient Medications:    atorvastatin  (LIPITOR ) 80 MG tablet, Take 1 tablet by mouth once daily, Disp: 90 tablet, Rfl: 3   clopidogrel  (PLAVIX ) 75 MG tablet, Take 75 mg by mouth daily., Disp: , Rfl:    empagliflozin (JARDIANCE) 10 MG TABS tablet, Take 1 tablet by mouth daily., Disp: , Rfl:    ibuprofen  (ADVIL ) 800 MG tablet, Take 800 mg by mouth 3 (three) times daily as needed for mild pain or moderate pain. , Disp: , Rfl:    metoprolol  tartrate (LOPRESSOR ) 25 MG tablet, Take 1 tablet by mouth twice daily, Disp: 180 tablet, Rfl: 3   pantoprazole  (PROTONIX ) 40 MG tablet, Take 1 tablet (40 mg total) by mouth daily. Please take 30 minutes before breakfast, Disp: 90 tablet, Rfl: 4   nitroGLYCERIN  (NITROSTAT ) 0.4 MG SL tablet, Place 1 tablet (0.4 mg total) under the tongue every 5 (five) minutes as needed., Disp: 25 tablet, Rfl: 6  Consent:   NA  Disposition:   6 months to eval evaluate  symptoms.  His questions and concerns were addressed to his satisfaction. He voices understanding of the recommendations provided during this encounter.    Signed, Madonna Large, DO, [facility]  [facility]  [address] [place], [place] [address] 09/18/2023 6:27 PM

## 2023-09-21 NOTE — Progress Notes (Signed)
 ------------------------------------------------------------------------------- Summary: Filling/Wax rims -------------------------------------------------------------------------------  [facility] 09/21/23  Dental procedures in this visit  . I0000 - WAX BITE RELATION Max  . I7059 - SEDATIVE FILLING 27 (Completed)    Service provider: Alan Delores Fairly, DMD    Billing provider: Alan Delores Kotis, DMD  . [phone] - RESIN-BASED COMPOSITE - THREE SURFACES, ANTERIOR 27 MFL (Completed)    Service provider: Alan Delores Fairly, DMD    Billing provider: Alan Delores Kotis, DMD  . [phone] - RESIN-BASED COMPOSITE - TWO SURFACES, POSTERIOR 20 DB (Completed)    Service provider: Alan Delores Fairly, DMD    Billing provider: Alan Delores Fairly, DMD   SUBJECTIVE Chief complaint: Patient reports with caries lesion(s) in need of restorative treatment.  Health History? Medical History[1] Medications Ordered Prior to Encounter[2] Allergies[3]  Problem List[4]  Surgical History[5] Family History[6] Social History[7]  Medical Risk Assessment ASA GRADE: ASA 1 - Normal health patient  Medical history was reviewed and updated. No contraindication to care.  Pain Level Tooth/teeth to be treated testing asymptomatic. Pain score currently: 0 (0-10 scale)  Patient's expectations: Patient understands the procedure and expects a functional and esthetically pleasing restoration.  OBJECTIVE Vital Signs BP Readings from Last 1 Encounters:  09/09/23 (!) 144/81  Pulse:    Pre-procedure Examination Tooth/teeth #20 and #27 being treated for the following condition(s): Caries  Pre-procedure Radiograph/Intra-oral Image Date taken: n/a  Radiograph interpretation:   Procedure ANESTHESIA Topical anesthetic: Benzocaine 20% Carbocaine HCL 3% Plain x Carpules: 1 carpule;  ISOLATION Cotton rolls and Dry angle isolation PREPARATION Caries excavated BASE/LINER Limelite  #27 MATRIX Mylar strip RESTORATIVE MATERIALS Bonding agent: Adhese, etch-and-rinse Composite resin: Filtek Supreme Ultra Flowable and TPH Spectra  ST, shade: A2 Sealant: Clinpro Amalgam: NA  FINISHING/POLISHING Occlusion checked and adjusted Restoration finished and polished using diamonds, carbides, and points/cups/discs  Post-procedure examination: Uneventful procedure, goal with procedure achieved.  Additional notes: None.  ASSESSMENT Diagnosis: Caries, restorable Prognosis: Good  PLAN Post-operative Instructions Patient informed about possible short-term sensitivity to cold.  Provider should be contacted in the event of severe sensitivity or any sensitivity to heat, and/or pain on biting. Patient advised to be careful with soft tissues on the anesthetized side until the anesthesia wears off.  Patient Education Patient educated on proper oral hygiene techniques and the importance of regular dental check-ups in order to maximize the longevity of the restoration.  Subjective: Patient reports no change to condition since last visit. Max edentulous and Mand partially edentulous pt presents to clinic for bite relation/tooth selection of max CD Lab will need to send Mand Wax rims to get opposing in order for us  to start Mand RPD.   Objective:  All tissues appear healthy and wnl.  Assessment: N/a  Plan: Pause to confirm correct patient, correct area for treatment, and confirm diagnosis for treatment.  yes Establishment and confirmation of facial contour, occlusal plane position, and occlusal vertical dimension (adequate freeway space, speech pattern, lip line). Mold/shape: N/a Shade: A3.5 Discussed with patient risks and precautions. Necessary consent were obtained from patient.  Additional notes: Lab will need to send Mand Wax rims to get opposing in order for us  to start Mand RPD.   NV: bite registration/tooth selection Max/Mand  Referrals: None  Next visit: Dr.  appointment:  Max/Mand wax rims/tooth selection   Treatment Providers Harlene Louis, DA II Talbert Early, DMD1 Amanda Brown Kotis, DMD [facility] [address] [place], [place]  [address] [phone]        [  1] Past Medical History: Diagnosis Date  . Cardiovascular disease   . Diabetes mellitus (CMS/HCC)   . History of carotid artery stenosis   . History of stroke   . Hypertension   . S/P thoracentesis 09/09/2023  [2] Current Outpatient Medications on File Prior to Visit  Medication Sig Dispense Refill  . aspirin  81 mg chewable tablet Chew 81 mg 1 (one) time each day.    . aspirin  81 MG oral suspension     . aspirin  81 mg tablet Take 81 mg by mouth in the morning.    . aspirin  81 mg tablet 1 (one) time each day at the same time.    . atorvastatin  (LIPITOR ) 40 mg tablet Take 80 mg by mouth 1 (one) time each day.    . atorvastatin  (LIPITOR ) 80 mg tablet Take 80 mg by mouth 1 (one) time each day.    . atorvastatin  (LIPITOR ) 80 mg tablet Take 80 mg by mouth 1 (one) time each day.    . blood-glucose meter (OneTouch Verio Reflect Meter) misc Monitor blood sugar once a day , Dx E11.69; Duration: 99 days    . clopidogreL  (PLAVIX ) 75 mg tablet Take 75 mg by mouth in the morning.    . clopidogreL  (PLAVIX ) 75 mg tablet 1 (one) time each day at the same time.    . doxycycline  (VIBRAMYCIN ) 100 mg capsule Take 1 capsule by mouth in the morning and at bedtime. (Patient not taking: Reported on 09/09/2023)    . DULoxetine  (CYMBALTA ) 30 mg DR capsule Take 30 mg by mouth. (Patient not taking: Reported on 09/09/2023)    . empagliflozin (Jardiance) 10 mg tablet 1 (one) time each day at the same time.    . empagliflozin 10 mg tablet Take 1 tablet by mouth 1 (one) time each day.    . escitalopram (LEXAPRO) 10 mg tablet Take 10 mg by mouth 1 (one) time each day.    SABRA glucose blood (OneTouch Verio test strips) test strip USE TO CHECK BLOOD SUGAR ONCE DAILY    .  ibuprofen  (ADVIL ,MOTRIN ) 800 mg tablet Take 800 mg by mouth. (Patient not taking: Reported on 09/09/2023)    . meloxicam  (MOBIC ) 15 mg tablet Take 1 tablet by mouth 1 (one) time each day.    . metoprolol  succinate (TOPROL -XL) 25 mg 24 hr tablet Take by mouth in the morning.    . metoprolol  tartrate (LOPRESSOR ) 25 mg tablet Take 25 mg by mouth in the morning and at bedtime. (Patient not taking: Reported on 09/09/2023)    . metoprolol  tartrate (LOPRESSOR ) 25 mg tablet Take 25 mg by mouth in the morning and at bedtime.    . metroNIDAZOLE  (FLAGYL ) 500 mg tablet Take 1 tablet by mouth in the morning and at bedtime. (Patient not taking: Reported on 09/09/2023)    . nitroglycerin  (NITROSTAT ) 0.4 mg SL tablet Place 0.4 mg under the tongue. (Patient taking differently: Place 0.4 mg under the tongue. As needed)    . OneTouch Delica Plus Lancet 30 gauge misc USE TO CHECK BLOOD SUGAR FINGER STICK ONCE DAILY    . OneTouch Verio test strips test strip USE TO CHECK BLOOD SUGAR ONCE DAILY    . pantoprazole  (PROTONIX ) 40 mg EC tablet Take 40 mg by mouth. (Patient not taking: Reported on 09/09/2023)    . sodium,potassium,mag sulfates 17.5-3.13-1.6 gram recon soln TAKE 1 KIT BY MOUTH ONCE FOR 1 DOSE (Patient not taking: Reported on 09/09/2023)    . traMADoL  (ULTRAM ) 50 mg tablet  Take by mouth.    . Trulicity 0.75 mg/0.5 mL pen injector INJECT 0.75 MG INTO THE SKIN ONCE A WEEK FOR 30 DAYS (Patient not taking: Reported on 09/09/2023)     No current facility-administered medications on file prior to visit.  [3] Allergies Allergen Reactions  . Metformin Other and Shortness of breath    Other reaction(s): Other (See Comments), SOB  Pressure on heart  Other reaction(s): SOB  Pressure on heart    Other reaction(s): Other (See Comments), SOB Pressure on heart Other reaction(s): SOB  . Metformin Hcl     Other reaction(s): SOB  . Allerfrim With Codeine Itching  . Codeine Itching  . Oxycodone  Itching    Other reaction(s):  itching  Other reaction(s): itching  Other reaction(s): itching  [4] Patient Active Problem List Diagnosis  . Abnormal finding on thyroid  function test  . Actinic keratosis  . Acute stress disorder  . Angina pectoris  . Anxiety  . Arthralgia of both knees  . Atherosclerosis of coronary artery  . Benign essential hypertension  . Cholelithiasis  . Chronic pain of left knee  . Degeneration of cervical intervertebral disc  . Diabetic neuropathy (CMS/HCC)  . Displaced other extraarticular fracture of right calcaneus, initial encounter for closed fracture  . Dyspnea on exertion  . Effusion, right ankle  . Emotional lability  . Fatty liver  . History of coronary artery bypass surgery  . History of TIA (transient ischemic attack)  . Hyperparathyroidism  . Hyperthyroidism  . Intermittent low back pain  . Late effect of fracture of lower extremity  . Mild recurrent major depression  . Postprocedural state  . Prediabetes  . Pure hypercholesterolemia  . Superficial thrombophlebitis  . Transient ischemic attack  . Type 2 diabetes mellitus without complication  . Vertigo  . Atherosclerosis of artery of both lower extremities  . Bilateral plantar fasciitis  . Carpal tunnel syndrome of right wrist  . History of stroke without residual deficits  . Osteoarthritis of left knee  . Epigastric pain  . Osteoarthritis of right knee  . Peripheral angiopathy due to type 2 diabetes mellitus (CMS/HCC)  . Peripheral vascular disease  . Primary localized osteoarthrosis of ankle and foot  . S/P thoracentesis  . Spinal stenosis of cervical region  . Ulnar nerve entrapment at elbow  . Vitamin B12 deficiency (non anemic)  . Arthralgia of right knee  . Atherosclerosis of aorta  . Acquired deformity of left foot  . Acquired deformity of right foot  . Mood change  . Hypercholesterolemia  . Type 2 diabetes mellitus without complications  . Encounter for dental examination  . Carpal tunnel  syndrome of right wrist  [5] Past Surgical History: Procedure Laterality Date  . CARDIAC SURGERY  2020   Bypass  [6] No family history on file. [7] Social History Tobacco Use  . Smoking status: Never  . Smokeless tobacco: Never  Substance Use Topics  . Alcohol use: Never  . Drug use: Never

## 2023-09-22 ENCOUNTER — Encounter: Payer: Self-pay | Admitting: Gastroenterology

## 2023-09-22 ENCOUNTER — Other Ambulatory Visit: Payer: Self-pay | Admitting: Gastroenterology

## 2023-09-22 ENCOUNTER — Ambulatory Visit: Admitting: Gastroenterology

## 2023-09-22 VITALS — BP 115/74 | HR 57 | Temp 97.9°F | Resp 10 | Ht 69.0 in | Wt 182.0 lb

## 2023-09-22 DIAGNOSIS — K315 Obstruction of duodenum: Secondary | ICD-10-CM | POA: Diagnosis not present

## 2023-09-22 DIAGNOSIS — K2950 Unspecified chronic gastritis without bleeding: Secondary | ICD-10-CM

## 2023-09-22 DIAGNOSIS — K449 Diaphragmatic hernia without obstruction or gangrene: Secondary | ICD-10-CM | POA: Diagnosis not present

## 2023-09-22 DIAGNOSIS — R109 Unspecified abdominal pain: Secondary | ICD-10-CM

## 2023-09-22 DIAGNOSIS — Z8619 Personal history of other infectious and parasitic diseases: Secondary | ICD-10-CM

## 2023-09-22 MED ORDER — SODIUM CHLORIDE 0.9 % IV SOLN: 500.0000 mL | INTRAVENOUS | Status: DC

## 2023-09-22 NOTE — Progress Notes (Signed)
 Pt's states no medical or surgical changes since previsit or office visit.

## 2023-09-22 NOTE — Patient Instructions (Addendum)
-  Await pathology results -Resume your Plavix  9/17 (tomorrow) -For further workup of abdominal pain, proceed with CT Abdo/pelvis with contrast  YOU HAD AN ENDOSCOPIC PROCEDURE TODAY AT THE [facility]:   Refer to the procedure report that was given to you for any specific questions about what was found during the examination.  If the procedure report does not answer your questions, please call your gastroenterologist to clarify.  If you requested that your care partner not be given the details of your procedure findings, then the procedure report has been included in a sealed envelope for you to review at your convenience later.  YOU SHOULD EXPECT: Some feelings of bloating in the abdomen. Passage of more gas than usual.  Walking can help get rid of the air that was put into your GI tract during the procedure and reduce the bloating. If you had a lower endoscopy (such as a colonoscopy or flexible sigmoidoscopy) you may notice spotting of blood in your stool or on the toilet paper. If you underwent a bowel prep for your procedure, you may not have a normal bowel movement for a few days.  Please Note:  You might notice some irritation and congestion in your nose or some drainage.  This is from the oxygen used during your procedure.  There is no need for concern and it should clear up in a day or so.  SYMPTOMS TO REPORT IMMEDIATELY:  Following upper endoscopy (EGD)  Vomiting of blood or coffee ground material  New chest pain or pain under the shoulder blades  Painful or persistently difficult swallowing  New shortness of breath  Fever of 100F or higher  Black, tarry-looking stools  For urgent or emergent issues, a gastroenterologist can be reached at any hour by calling [phone]. Do not use MyChart messaging for urgent concerns.    DIET:  We do recommend a small meal at first, but then you may proceed to your regular diet.  Drink plenty of fluids but you should avoid alcoholic  beverages for 24 hours.  ACTIVITY:  You should plan to take it easy for the rest of today and you should NOT DRIVE or use heavy machinery until tomorrow (because of the sedation medicines used during the test).    FOLLOW UP: Our staff will call the number listed on your records the next business day following your procedure.  We will call around 7:15- 8:00 am to check on you and address any questions or concerns that you may have regarding the information given to you following your procedure. If we do not reach you, we will leave a message.     If any biopsies were taken you will be contacted by phone or by letter within the next 1-3 weeks.  Please call us  at [phone] if you have not heard about the biopsies in 3 weeks.    SIGNATURES/CONFIDENTIALITY: You and/or your care partner have signed paperwork which will be entered into your electronic medical record.  These signatures attest to the fact that that the information above on your After Visit Summary has been reviewed and is understood.  Full responsibility of the confidentiality of this discharge information lies with you and/or your care-partner.

## 2023-09-22 NOTE — Op Note (Signed)
 [facility] Patient Name: Tom Phelps Procedure Date: 09/22/2023 1:56 PM MRN: [id number] Endoscopist: Lynnie Bring , MD, [id number] Age: 63 Referring MD:  Date of Birth: 01-Mar-1960 Gender: Male Account #: [id number] Procedure:                Upper GI endoscopy Indications:              FU gastric ulcers. Treated with quad therapy for H.                            pylori. Medicines:                Monitored Anesthesia Care Procedure:                Pre-Anesthesia Assessment:                           - Prior to the procedure, a History and Physical                            was performed, and patient medications and                            allergies were reviewed. [name] tolerance of                            previous anesthesia was also reviewed. The risks                            and benefits of the procedure and the sedation                            options and risks were discussed with the patient.                            All questions were answered, and informed consent                            was obtained. Prior Anticoagulants: Plavix  was                            stopped 5 days prior. ASA Grade Assessment: II - A                            patient with mild systemic disease. After reviewing                            the risks and benefits, the patient was deemed in                            satisfactory condition to undergo the procedure.                           After obtaining informed consent, the endoscope was  passed under direct vision. Throughout the                            procedure, the patient's blood pressure, pulse, and                            oxygen saturations were monitored continuously. The                            GIF [id number] #[id number] was introduced through the                            mouth, and advanced to the second part of duodenum.                            The upper GI endoscopy was  accomplished without                            difficulty. The patient tolerated the procedure                            well. Scope In: Scope Out: Findings:                 The examined esophagus was normal.                           The Z-line was regular and was found 35 cm from the                            incisors.                           A small hiatal hernia was present.                           The entire examined stomach was normal. Ulcers had                            completely healed. Multiple biopsies were taken                            from antrum, body of the stomach and fundus with a                            cold forceps for histology.                           An acquired mild benign-appearing, intrinsic mild                            stenosis was found in the D1/D2 junction and was                            traversed. Complications:  No immediate complications. Estimated Blood Loss:     Estimated blood loss: none. Impression:               - Normal esophagus.                           - Z-line regular, 35 cm from the incisors.                           - Small hiatal hernia.                           - Normal stomach. Biopsied. Complete healing of                            gastric ulcers.                           - Acquired duodenal stenosis. Recommendation:           - Patient has a contact number available for                            emergencies. The signs and symptoms of potential                            delayed complications were discussed with the                            patient. Return to normal activities tomorrow.                            Written discharge instructions were provided to the                            patient.                           - Resume previous diet.                           - Continue present medications.                           - Resume Plavix  9/17                           - For further  workup of abdominal pain, proceed                            with CT Abdo/pelvis with contrast.                           - The findings and recommendations were discussed                            with the patient. Lynnie Bring, MD 09/22/2023 2:14:56 PM This report has been signed  electronically.

## 2023-09-22 NOTE — Progress Notes (Signed)
 Chief Complaint: Follow-up Primary GI MD: Dr. Charlanne   HPI: 63 year old male history of CABG x 2 (2020), cholelithiasis, type 2 diabetes, presents for follow-up   Seen 10/14/2022.  Epigastric pain that is chronic, exertional, positional, nonprandial with no dysphagia or heartburn and pain similar to prior angina.  CBC, CMP, lipase unrevealing.  Recommended to see cardiology prior to consideration of EGD/colonoscopy   Discussed the use of AI scribe software for clinical note transcription with the patient, who gave verbal consent to proceed.   History of Present Illness He experiences persistent pain in the upper abdomen, which worsens with activities such as bending over, washing dishes, or leaning forward. The pain is described as a 'hard rock' sensation in the center of the chest and does not worsen with deep breaths. Ibuprofen , taken occasionally for knee pain, does not alleviate the abdominal pain.   The pain began approximately four years ago, leading to the removal of his gallbladder, which did not relieve the symptoms. He recalls an episode at work where he experienced heavy breathing and visited the emergency room, but no cause was identified after a six-hour stay.   He recently started Trulicity after switching from Jardiance and notes the onset of heartburn since starting this medication. He has a history of being prescribed Cymbalta  for muscle pain and depression but discontinued it due to side effects.   He recalls a previous severe episode during a camping trip in [place] , which necessitated cutting the trip short and seeking emergency care. No family history of colon cancer and he has never undergone a colonoscopy.       Past Medical History:  Diagnosis Date   Actinic keratoses     Ankle dislocation      calcaneal tendons   Anxiety     Aortic atherosclerosis (HCC)     Chronic pain of left knee 11/18/2017   Coronary artery disease     DDD (degenerative disc disease),  cervical     Diabetes mellitus without complication (HCC) 2019    no meds   Displaced other extraarticular fracture of right calcaneus, initial encounter for closed fracture 07/13/2017   DOE (dyspnea on exertion)     Dyspnea     Fatty liver      mild   Fracture of distal end of right fibula     Gallstones     History of kidney stones     Hypercholesteremia     Hypercholesterolemia     Hypertension     Lightheadedness     Neck pain      due to work    Neuromuscular disorder (HCC)      carpal tunnel bi lat   Prediabetes     Pulmonary nodules     Stress reaction     Stroke (HCC)      2018 no residuals   TIA (transient ischemic attack)      circulation                Past Surgical History:  Procedure Laterality Date   BACK SURGERY   2005    L4-L5 fusion   CHOLECYSTECTOMY N/A 03/02/2020    Procedure: LAPAROSCOPIC CHOLECYSTECTOMY;  Surgeon: Teresa Lonni HERO, MD;  Location: [facility] ORS;  Service: General;  Laterality: N/A;   CORONARY ARTERY BYPASS GRAFT N/A 07/29/2018    Procedure: CORONARY ARTERY BYPASS GRAFTING (CABG) x 2;  Surgeon: Shyrl Linnie KIDD, MD;  Location: [facility] OR;  Service: Open Heart Surgery;  Laterality: N/A;   CORONARY PRESSURE/FFR STUDY N/A 07/20/2018    Procedure: INTRAVASCULAR PRESSURE WIRE/FFR STUDY;  Surgeon: Dann Candyce RAMAN, MD;  Location: [facility] INVASIVE CV LAB;  Service: Cardiovascular;  Laterality: N/A;   ENDOVEIN HARVEST OF GREATER SAPHENOUS VEIN Left 07/29/2018    Procedure: Endovein Harvest Of Greater Saphenous Vein Left Thigh; Exploration only of right leg greater saphenous vein;  Surgeon: Shyrl Linnie KIDD, MD;  Location: [facility] OR;  Service: Open Heart Surgery;  Laterality: Left;   LEFT HEART CATH AND CORONARY ANGIOGRAPHY N/A 07/20/2018    Procedure: LEFT HEART CATH AND CORONARY ANGIOGRAPHY;  Surgeon: Dann Candyce RAMAN, MD;  Location: [facility] INVASIVE CV LAB;  Service: Cardiovascular;  Laterality: N/A;   TEE WITHOUT CARDIOVERSION N/A 07/29/2018     Procedure: TRANSESOPHAGEAL ECHOCARDIOGRAM (TEE);  Surgeon: Shyrl Linnie KIDD, MD;  Location: [facility] OR;  Service: Open Heart Surgery;  Laterality: N/A;                Current Outpatient Medications  Medication Sig Dispense Refill   aspirin  EC 81 MG tablet Take 81 mg by mouth daily.       atorvastatin  (LIPITOR ) 80 MG tablet Take 1 tablet by mouth once daily 90 tablet 3   clopidogrel  (PLAVIX ) 75 MG tablet Take 75 mg by mouth daily.       DULoxetine  (CYMBALTA ) 30 MG capsule Take 1 capsule (30 mg total) by mouth daily. 30 capsule 3   ibuprofen  (ADVIL ) 800 MG tablet Take 800 mg by mouth 3 (three) times daily as needed for mild pain or moderate pain.        metoprolol  tartrate (LOPRESSOR ) 25 MG tablet Take 1 tablet by mouth twice daily 180 tablet 3   TRULICITY 0.75 MG/0.5ML SOAJ INJECT 0.75 MG INTO THE SKIN ONCE A WEEK FOR 30 DAYS       empagliflozin (JARDIANCE) 10 MG TABS tablet Take 1 tablet by mouth daily. (Patient not taking: Reported on 05/08/2023)       nitroGLYCERIN  (NITROSTAT ) 0.4 MG SL tablet Place 1 tablet (0.4 mg total) under the tongue every 5 (five) minutes as needed. 25 tablet 3      No current facility-administered medications for this visit.             Allergies as of 05/08/2023 - Review Complete 05/08/2023  Allergen Reaction Noted   Metformin and related Other (See Comments) and Shortness Of Breath 07/13/2018   Metformin hcl   01/31/2020   Codeine Itching 07/03/2021   Oxycodone  Itching 06/07/2018   Oxycodone  hcl Itching 01/31/2020           Family History  Problem Relation Age of Onset   CAD Mother     CAD Father            Social History         Socioeconomic History   Marital status: Married      Spouse name: Not on file   Number of children: 2   Years of education: Not on file   Highest education level: Not on file  Occupational History   Not on file  Tobacco Use   Smoking status: Never   Smokeless tobacco: Never  Vaping Use   Vaping status: Never  Used  Substance and Sexual Activity   Alcohol use: Never   Drug use: Never   Sexual activity: Yes      Comment: married Estate agent)  Other Topics Concern   Not on file  Social History Narrative  Right handed    Drinks caffeine    Two story home    Social Drivers of Health    Financial Resource Strain: Not on file  Food Insecurity: Not on file  Transportation Needs: Not on file  Physical Activity: Not on file  Stress: Not on file  Social Connections: Not on file  Intimate Partner Violence: Not on file      Review of Systems:    Constitutional: No weight loss, fever, chills, weakness or fatigue HEENT: Eyes: No change in vision               Ears, Nose, Throat:  No change in hearing or congestion Skin: No rash or itching Cardiovascular: No chest pain, chest pressure or palpitations   Respiratory: No SOB or cough Gastrointestinal: See HPI and otherwise negative Genitourinary: No dysuria or change in urinary frequency Neurological: No headache, dizziness or syncope Musculoskeletal: No new muscle or joint pain Hematologic: No bleeding or bruising Psychiatric: No history of depression or anxiety      Physical Exam:  Vital signs: BP 130/82   Pulse 74   Ht 5' 9 (1.753 m)   Wt 191 lb (86.6 kg)   BMI 28.21 kg/m    Constitutional: NAD, alert and cooperative Head:  Normocephalic and atraumatic. Eyes:   PEERL, EOMI. No icterus. Conjunctiva pink. Respiratory: Respirations even and unlabored. Lungs clear to auscultation bilaterally.   No wheezes, crackles, or rhonchi.  Cardiovascular:  Regular rate and rhythm. No peripheral edema, cyanosis or pallor.  Gastrointestinal:  Soft, nondistended, tenderness localized to xiphoid process. No rebound or guarding. Normal bowel sounds. No appreciable masses or hepatomegaly. Rectal:  Declines Msk:  Symmetrical without gross deformities. Without edema, no deformity or joint abnormality.  Neurologic:  Alert and  oriented x4;  grossly normal  neurologically.  Skin:   Dry and intact without significant lesions or rashes. Psychiatric: Oriented to person, place and time. Demonstrates good judgement and reason without abnormal affect or behaviors.     RELEVANT LABS AND IMAGING: CBC Labs (Brief)          Component Value Date/Time    WBC 6.8 01/22/2023 0814    RBC 5.48 01/22/2023 0814    HGB 16.1 01/22/2023 0814    HGB 13.4 05/14/2020 1015    HCT 49.1 01/22/2023 0814    HCT 39.3 05/14/2020 1015    PLT 253 01/22/2023 0814    PLT 252 05/14/2020 1015    MCV 89.6 01/22/2023 0814    MCV 91 05/14/2020 1015    MCH 29.4 01/22/2023 0814    MCHC 32.8 01/22/2023 0814    RDW 14.9 01/22/2023 0814    RDW 13.0 05/14/2020 1015    LYMPHSABS 1.6 10/14/2022 1452    MONOABS 0.4 10/14/2022 1452    EOSABS 0.1 10/14/2022 1452    BASOSABS 0.0 10/14/2022 1452        CMP     Labs (Brief)          Component Value Date/Time    NA 132 (L) 01/22/2023 0814    NA 142 04/03/2020 1418    K 3.2 (L) 01/22/2023 0814    CL 102 01/22/2023 0814    CO2 19 (L) 01/22/2023 0814    GLUCOSE 216 (H) 01/22/2023 0814    BUN 19 01/22/2023 0814    BUN 14 04/03/2020 1418    CREATININE 1.16 01/22/2023 0814    CALCIUM  9.5 01/22/2023 0814    PROT 7.5 01/22/2023 9185  ALBUMIN  4.3 01/22/2023 0814    AST 27 01/22/2023 0814    ALT 14 01/22/2023 0814    ALKPHOS 90 01/22/2023 0814    BILITOT 1.4 (H) 01/22/2023 0814    GFRNONAA >60 01/22/2023 0814    GFRAA >60 10/01/2019 2105          Assessment/Plan:    CAD s/p CABG x 2 Seen by cardiology January 2025 with overall unremarkable workup including echo and stress test.  Echocardiogram 11/2022 with EF 60 to 65%.  No further exertional angina chest pain   Epigastric pain Ongoing for years.  More pronounced when bending forward and associated with difficulty breathing.  Not changed with eating.  No improvement with ibuprofen .  No improvement after cholecystectomy.  Tenderness localized to xiphoid process.  No  previous EGD.   CTAP 2021 with partial fatty replacement pancreas, normal lipase.   CT chest with pulmonary nodule Suspect with his pinpoint epigastric pain being localized to the xiphoid process associated with movement he may have costochondritis versus xiphodynia.  Less likely concern for GERD/PUD since no association with eating.   - EGD for further evaluation - I thoroughly discussed the procedure with the patient (at bedside) to include nature of the procedure, alternatives, benefits, and risks (including but not limited to bleeding, infection, perforation, anesthesia/cardiac pulmonary complications).  Patient verbalized understanding and gave verbal consent to proceed with procedure. - If EGD is negative, consider MRCP to evaluate pancreas with previous CT showing fatty replacement -- if MRCP is negative, refer back to PCP for MSK etiology   Colon cancer screening No previous colonoscopy - Schedule colonoscopy - I thoroughly discussed the procedure with the patient (at bedside) to include nature of the procedure, alternatives, benefits, and risks (including but not limited to bleeding, infection, perforation, anesthesia/cardiac pulmonary complications).  Patient verbalized understanding and gave verbal consent to proceed with procedure.    TIA On Plavix  Hold Plavix  x 5 days - Will hold Plavix  5 days prior to endoscopic procedures - will instruct when and how to resume after procedure. Benefits and risks of procedure explained including risks of bleeding, perforation, infection, missed lesions, reactions to medications and possible need for hospitalization and surgery for complications. Additional rare but real risk of stroke or other vascular clotting events off Plavix  also explained and need to seek urgent help if any signs of these problems occur. Will communicate by phone or EMR with patient's  prescribing provider to confirm that holding Plavix  is reasonable in this case.       Nestor Blower, PA-C   Attending physician's note   I have taken history, reviewed the chart and examined the patient. I performed a substantive portion of this encounter, including complete performance of at least one of the key components, in conjunction with the APP. I agree with the Advanced Practitioner's note, impression and recommendations.   EGD for FU GU with + HP- treated with Quad Rx Plavix  on hold x 5 days Neg colon 06/2023   Anselm Bring, MD [facility] [phone]

## 2023-09-22 NOTE — Progress Notes (Signed)
 Sedate, gd SR, tolerated procedure well, VSS, report to RN

## 2023-09-22 NOTE — Progress Notes (Signed)
 Called to room to assist during endoscopic procedure.  Patient ID and intended procedure confirmed with present staff. Received instructions for my participation in the procedure from the performing physician.

## 2023-09-23 ENCOUNTER — Telehealth: Payer: Self-pay | Admitting: *Deleted

## 2023-09-23 NOTE — Addendum Note (Signed)
 Addended by: KATHIE BOTTCHER E on: 09/23/2023 12:25 PM   Modules accepted: Orders

## 2023-09-23 NOTE — Telephone Encounter (Signed)
  Follow up Call-     09/22/2023    1:04 PM 06/26/2023    2:18 PM  Call back number  Post procedure Call Back phone  # [phone]  Permission to leave phone message Yes Yes     Patient questions:   Message left to call us  if necessary.

## 2023-09-23 NOTE — Telephone Encounter (Signed)
 Patient made aware of CT that is scheduled

## 2023-09-28 LAB — SURGICAL PATHOLOGY

## 2023-09-30 ENCOUNTER — Encounter: Admitting: Gastroenterology

## 2023-10-02 ENCOUNTER — Other Ambulatory Visit (HOSPITAL_BASED_OUTPATIENT_CLINIC_OR_DEPARTMENT_OTHER)

## 2023-10-07 ENCOUNTER — Ambulatory Visit: Payer: Self-pay | Admitting: Gastroenterology

## 2023-10-14 ENCOUNTER — Encounter: Payer: Self-pay | Admitting: Gastroenterology

## 2023-11-05 ENCOUNTER — Encounter: Payer: Self-pay | Admitting: Gastroenterology

## 2023-11-08 NOTE — Telephone Encounter (Signed)
 No need for rpt CT AP. Pl cancel that RG

## 2023-11-13 ENCOUNTER — Ambulatory Visit (HOSPITAL_BASED_OUTPATIENT_CLINIC_OR_DEPARTMENT_OTHER)

## 2024-01-12 ENCOUNTER — Encounter: Payer: Self-pay | Admitting: Cardiology

## 2024-01-17 ENCOUNTER — Encounter: Payer: Self-pay | Admitting: Gastroenterology

## 2024-01-19 NOTE — Telephone Encounter (Signed)
 As long he is low risk for bleeding - continue plavix  given your hx of CAD and prior TIA.  I stopped ASA at the last visit.  No need for dual antiplatelets   Dr. Michele
